# Patient Record
Sex: Male | Born: 1935 | Race: White | Hispanic: No | Marital: Married | Smoking: Former smoker
Health system: Southern US, Community
[De-identification: ages and names within clinical notes are randomized; demographics above are authoritative.]

## PROBLEM LIST (undated history)

## (undated) DIAGNOSIS — G20 Parkinson's disease: Secondary | ICD-10-CM

## (undated) DIAGNOSIS — I639 Cerebral infarction, unspecified: Secondary | ICD-10-CM

## (undated) DIAGNOSIS — I2510 Atherosclerotic heart disease of native coronary artery without angina pectoris: Secondary | ICD-10-CM

## (undated) DIAGNOSIS — Z992 Dependence on renal dialysis: Secondary | ICD-10-CM

## (undated) DIAGNOSIS — I219 Acute myocardial infarction, unspecified: Secondary | ICD-10-CM

## (undated) DIAGNOSIS — N289 Disorder of kidney and ureter, unspecified: Secondary | ICD-10-CM

## (undated) HISTORY — PX: PACEMAKER INSERTION: SHX728

---

## 2004-03-21 ENCOUNTER — Other Ambulatory Visit: Payer: Self-pay

## 2004-07-24 ENCOUNTER — Ambulatory Visit: Payer: Self-pay

## 2004-10-10 ENCOUNTER — Inpatient Hospital Stay: Payer: Self-pay | Admitting: Internal Medicine

## 2005-04-29 ENCOUNTER — Ambulatory Visit: Payer: Self-pay | Admitting: Internal Medicine

## 2005-11-21 ENCOUNTER — Inpatient Hospital Stay: Payer: Self-pay | Admitting: Cardiology

## 2005-11-22 ENCOUNTER — Other Ambulatory Visit: Payer: Self-pay

## 2006-06-19 ENCOUNTER — Inpatient Hospital Stay: Payer: Self-pay | Admitting: Cardiology

## 2006-06-19 ENCOUNTER — Other Ambulatory Visit: Payer: Self-pay

## 2006-10-06 ENCOUNTER — Ambulatory Visit: Payer: Self-pay | Admitting: Gastroenterology

## 2006-11-14 ENCOUNTER — Other Ambulatory Visit: Payer: Self-pay

## 2006-11-14 ENCOUNTER — Emergency Department: Payer: Self-pay

## 2007-08-13 ENCOUNTER — Ambulatory Visit: Payer: Self-pay | Admitting: Cardiology

## 2007-08-27 ENCOUNTER — Ambulatory Visit: Payer: Self-pay | Admitting: Cardiology

## 2008-02-07 ENCOUNTER — Inpatient Hospital Stay: Payer: Self-pay | Admitting: Internal Medicine

## 2008-02-07 ENCOUNTER — Other Ambulatory Visit: Payer: Self-pay

## 2008-09-06 ENCOUNTER — Ambulatory Visit: Payer: Self-pay | Admitting: Internal Medicine

## 2009-02-22 ENCOUNTER — Ambulatory Visit: Payer: Self-pay | Admitting: Ophthalmology

## 2009-10-25 ENCOUNTER — Encounter: Payer: Self-pay | Admitting: Unknown Physician Specialty

## 2009-11-27 ENCOUNTER — Encounter: Payer: Self-pay | Admitting: Unknown Physician Specialty

## 2009-12-19 ENCOUNTER — Emergency Department: Payer: Self-pay

## 2009-12-20 ENCOUNTER — Inpatient Hospital Stay: Payer: Self-pay | Admitting: Unknown Physician Specialty

## 2009-12-29 ENCOUNTER — Observation Stay: Payer: Self-pay | Admitting: Urology

## 2010-01-02 LAB — PATHOLOGY REPORT

## 2011-04-17 ENCOUNTER — Ambulatory Visit: Payer: Self-pay | Admitting: Urology

## 2011-08-27 ENCOUNTER — Emergency Department: Payer: Self-pay | Admitting: Emergency Medicine

## 2012-03-23 ENCOUNTER — Inpatient Hospital Stay: Payer: Self-pay | Admitting: Internal Medicine

## 2012-03-23 LAB — BASIC METABOLIC PANEL
BUN: 33 mg/dL — ABNORMAL HIGH (ref 7–18)
Chloride: 101 mmol/L (ref 98–107)
EGFR (African American): 10 — ABNORMAL LOW
EGFR (Non-African Amer.): 8 — ABNORMAL LOW
Glucose: 113 mg/dL — ABNORMAL HIGH (ref 65–99)
Osmolality: 287 (ref 275–301)
Potassium: 4 mmol/L (ref 3.5–5.1)
Sodium: 140 mmol/L (ref 136–145)

## 2012-03-23 LAB — CBC
HGB: 11.4 g/dL — ABNORMAL LOW (ref 13.0–18.0)
MCH: 32.5 pg (ref 26.0–34.0)
MCHC: 34.4 g/dL (ref 32.0–36.0)
Platelet: 244 10*3/uL (ref 150–440)
RBC: 3.5 10*6/uL — ABNORMAL LOW (ref 4.40–5.90)
RDW: 14.2 % (ref 11.5–14.5)
WBC: 9 10*3/uL (ref 3.8–10.6)

## 2012-03-24 LAB — LIPID PANEL
HDL Cholesterol: 36 mg/dL — ABNORMAL LOW (ref 40–60)
Triglycerides: 172 mg/dL (ref 0–200)

## 2012-03-24 LAB — BASIC METABOLIC PANEL
Calcium, Total: 9.1 mg/dL (ref 8.5–10.1)
Chloride: 100 mmol/L (ref 98–107)
Creatinine: 6.31 mg/dL — ABNORMAL HIGH (ref 0.60–1.30)
EGFR (African American): 9 — ABNORMAL LOW
Glucose: 88 mg/dL (ref 65–99)
Osmolality: 288 (ref 275–301)
Potassium: 4.2 mmol/L (ref 3.5–5.1)
Sodium: 140 mmol/L (ref 136–145)

## 2012-03-24 LAB — CBC WITH DIFFERENTIAL/PLATELET
Basophil #: 0 10*3/uL (ref 0.0–0.1)
Basophil %: 0.6 %
Eosinophil %: 4.3 %
HCT: 33 % — ABNORMAL LOW (ref 40.0–52.0)
HGB: 11.2 g/dL — ABNORMAL LOW (ref 13.0–18.0)
Lymphocyte %: 17.1 %
MCHC: 34.1 g/dL (ref 32.0–36.0)
Monocyte %: 9.3 %
Neutrophil #: 5.1 10*3/uL (ref 1.4–6.5)
Neutrophil %: 68.7 %
Platelet: 233 10*3/uL (ref 150–440)
RBC: 3.47 10*6/uL — ABNORMAL LOW (ref 4.40–5.90)
RDW: 13.9 % (ref 11.5–14.5)
WBC: 7.4 10*3/uL (ref 3.8–10.6)

## 2012-03-24 LAB — HEMOGLOBIN A1C: Hemoglobin A1C: 4.9 % (ref 4.2–6.3)

## 2012-03-25 LAB — CBC WITH DIFFERENTIAL/PLATELET
Basophil #: 0 10*3/uL (ref 0.0–0.1)
Basophil %: 0.5 %
Eosinophil #: 0.4 10*3/uL (ref 0.0–0.7)
HCT: 32.1 % — ABNORMAL LOW (ref 40.0–52.0)
HGB: 10.9 g/dL — ABNORMAL LOW (ref 13.0–18.0)
Lymphocyte %: 13.1 %
MCHC: 33.9 g/dL (ref 32.0–36.0)
Monocyte %: 7.5 %
Neutrophil #: 7.5 10*3/uL — ABNORMAL HIGH (ref 1.4–6.5)
Neutrophil %: 75.1 %
RBC: 3.37 10*6/uL — ABNORMAL LOW (ref 4.40–5.90)
RDW: 14.3 % (ref 11.5–14.5)
WBC: 10 10*3/uL (ref 3.8–10.6)

## 2012-03-25 LAB — COMPREHENSIVE METABOLIC PANEL
Albumin: 3.5 g/dL (ref 3.4–5.0)
Anion Gap: 14 (ref 7–16)
Bilirubin,Total: 0.4 mg/dL (ref 0.2–1.0)
Calcium, Total: 9.1 mg/dL (ref 8.5–10.1)
Chloride: 101 mmol/L (ref 98–107)
Creatinine: 7.6 mg/dL — ABNORMAL HIGH (ref 0.60–1.30)
EGFR (African American): 7 — ABNORMAL LOW
EGFR (Non-African Amer.): 6 — ABNORMAL LOW
Glucose: 95 mg/dL (ref 65–99)
Osmolality: 294 (ref 275–301)
Potassium: 4.5 mmol/L (ref 3.5–5.1)
Sodium: 141 mmol/L (ref 136–145)
Total Protein: 7.4 g/dL (ref 6.4–8.2)

## 2012-03-26 LAB — BASIC METABOLIC PANEL
BUN: 29 mg/dL — ABNORMAL HIGH (ref 7–18)
Calcium, Total: 9.3 mg/dL (ref 8.5–10.1)
Chloride: 95 mmol/L — ABNORMAL LOW (ref 98–107)
Co2: 33 mmol/L — ABNORMAL HIGH (ref 21–32)
Creatinine: 5.66 mg/dL — ABNORMAL HIGH (ref 0.60–1.30)
EGFR (African American): 10 — ABNORMAL LOW
Sodium: 137 mmol/L (ref 136–145)

## 2012-03-26 LAB — CBC WITH DIFFERENTIAL/PLATELET
Basophil %: 0.4 %
Eosinophil %: 4.4 %
HCT: 32.3 % — ABNORMAL LOW (ref 40.0–52.0)
HGB: 11 g/dL — ABNORMAL LOW (ref 13.0–18.0)
Lymphocyte %: 14.4 %
MCV: 95 fL (ref 80–100)
Monocyte #: 0.8 x10 3/mm (ref 0.2–1.0)
Monocyte %: 8.6 %
Neutrophil #: 6.6 10*3/uL — ABNORMAL HIGH (ref 1.4–6.5)
Neutrophil %: 72.2 %
RBC: 3.41 10*6/uL — ABNORMAL LOW (ref 4.40–5.90)
WBC: 9.1 10*3/uL (ref 3.8–10.6)

## 2012-05-28 ENCOUNTER — Ambulatory Visit: Payer: Self-pay | Admitting: Vascular Surgery

## 2012-07-09 ENCOUNTER — Ambulatory Visit: Payer: Self-pay | Admitting: Vascular Surgery

## 2012-09-15 ENCOUNTER — Encounter: Payer: Self-pay | Admitting: Neurology

## 2012-09-22 ENCOUNTER — Encounter: Payer: Self-pay | Admitting: Neurology

## 2012-10-22 ENCOUNTER — Encounter: Payer: Self-pay | Admitting: Neurology

## 2012-11-19 ENCOUNTER — Ambulatory Visit: Payer: Self-pay | Admitting: Cardiology

## 2012-11-22 ENCOUNTER — Encounter: Payer: Self-pay | Admitting: Neurology

## 2014-01-11 ENCOUNTER — Ambulatory Visit: Payer: Self-pay | Admitting: Cardiology

## 2014-01-11 DIAGNOSIS — I10 Essential (primary) hypertension: Secondary | ICD-10-CM

## 2014-01-11 DIAGNOSIS — I2510 Atherosclerotic heart disease of native coronary artery without angina pectoris: Secondary | ICD-10-CM

## 2014-01-11 LAB — CBC WITH DIFFERENTIAL/PLATELET
BASOS ABS: 0 10*3/uL (ref 0.0–0.1)
Basophil %: 0.1 %
EOS PCT: 0.1 %
Eosinophil #: 0 10*3/uL (ref 0.0–0.7)
HCT: 38.4 % — AB (ref 40.0–52.0)
HGB: 12.2 g/dL — AB (ref 13.0–18.0)
Lymphocyte #: 0.8 10*3/uL — ABNORMAL LOW (ref 1.0–3.6)
Lymphocyte %: 5.7 %
MCH: 32.5 pg (ref 26.0–34.0)
MCHC: 31.8 g/dL — ABNORMAL LOW (ref 32.0–36.0)
MCV: 102 fL — ABNORMAL HIGH (ref 80–100)
Monocyte #: 0.4 x10 3/mm (ref 0.2–1.0)
Monocyte %: 2.9 %
NEUTROS ABS: 13.3 10*3/uL — AB (ref 1.4–6.5)
Neutrophil %: 91.2 %
PLATELETS: 203 10*3/uL (ref 150–440)
RBC: 3.76 10*6/uL — ABNORMAL LOW (ref 4.40–5.90)
RDW: 15.3 % — ABNORMAL HIGH (ref 11.5–14.5)
WBC: 14.6 10*3/uL — ABNORMAL HIGH (ref 3.8–10.6)

## 2014-01-11 LAB — BASIC METABOLIC PANEL
ANION GAP: 11 (ref 7–16)
BUN: 73 mg/dL — ABNORMAL HIGH (ref 7–18)
CALCIUM: 8.2 mg/dL — AB (ref 8.5–10.1)
Chloride: 101 mmol/L (ref 98–107)
Co2: 28 mmol/L (ref 21–32)
Creatinine: 7.96 mg/dL — ABNORMAL HIGH (ref 0.60–1.30)
EGFR (African American): 7 — ABNORMAL LOW
EGFR (Non-African Amer.): 6 — ABNORMAL LOW
GLUCOSE: 131 mg/dL — AB (ref 65–99)
Osmolality: 303 (ref 275–301)
Potassium: 5.4 mmol/L — ABNORMAL HIGH (ref 3.5–5.1)
Sodium: 140 mmol/L (ref 136–145)

## 2014-01-11 LAB — PROTIME-INR
INR: 1.1
Prothrombin Time: 14.5 secs (ref 11.5–14.7)

## 2014-01-11 LAB — APTT: Activated PTT: 34.7 secs (ref 23.6–35.9)

## 2014-01-13 ENCOUNTER — Ambulatory Visit: Payer: Self-pay | Admitting: Cardiology

## 2014-10-11 NOTE — Consult Note (Signed)
PATIENT NAME:  Gus PumaSS, Seville E MR#:  [id number] DATE OF BIRTH:  05-03-1929  DATE OF CONSULTATION:  03/24/2012  REFERRING PHYSICIAN:  Aram BeechamJeffrey Sparks, MD CONSULTING PHYSICIAN:  Hemang K. Sherryll BurgerShah, MD  PRIMARY CARE PHYSICIAN: Daniel NonesBert Klein, MD  REASON FOR CONSULTATION: Slurred speech and right-sided weakness.   HISTORY OF PRESENT ILLNESS: Mr. Darrin Nipperass is an 81 year old right-handed Caucasian gentleman who had acute onset of weakness of the right arm and leg and some slurred speech on 03/23/2012 around 7:00 p.m.   The patient did not have any pain. He did not have any headache trauma or neck trauma. He was brought to the ER. His symptoms had significantly improved by that time but the patient retrospectively feels like his symptoms lasted for around 3 to 4 hours.   Today he feels like his right leg is still a little bit "tight" and is not quite itself, but otherwise he is not having any slurred speech or any right upper extremity weakness.   The patient denied any history of recent palpitations. He does not have any recent history of stroke. Before this stroke he was taking aspirin 81 mg p.o. daily.   The patient does have risk factors such as hypertension and hyperlipidemia and is on hemodialysis due to end-stage renal disease.   The patient has a history of developing shingles in his left V1 distribution causing pain and ptosis in his left eyelid.   The patient also has a history of pacemaker placement after recurrent passing out spells and found to have bradycardia.   PAST MEDICAL HISTORY:  1. Hypertension. 2. Hyperlipidemia. 3. Coronary artery disease. 4. Gout.  5. End stage renal disease on hemodialysis. 6. Anemia. 7. Gastroesophageal reflux disease.  8. Complete heart block status post pacemaker placement.   PAST SURGICAL HISTORY:  1. Pacemaker placement.  2. Cholecystectomy.  3. Bladder tumor status post surgery.   SOCIAL HISTORY: He does not smoke, does not drink alcohol, and does not  do recreational drugs.   FAMILY HISTORY: History is significant for coronary artery disease.   DRUG ALLERGIES: Penicillin.   MEDICATIONS: I reviewed his home medication list.   REVIEW OF SYSTEMS: Positive for left eyelid ptosis, pain in his left forehead, and numbness in his right leg which is chronic and recent slurred speech and right arm and leg weakness which is improved, except some tightness feeling in his right arm.   The rest of the 10 system review of systems was asked and was found to be negative.   PHYSICAL EXAMINATION:   VITAL SIGNS: Temperature 98.3, pulse 59, respiratory rate 20, blood pressure 138/65, and pulse oximetry 98% on 2 liters of oxygen.   GENERAL: He is an elderly-looking Caucasian gentleman lying in bed, not in acute distress. He was actually sleeping when I arrived in the room.   LUNGS: Clear to auscultation.   HEART: S1 and S2 heart sounds.   NECK: Carotid exam did not reveal any bruit.   EYES: Funduscopic exam was difficult to perform. He has bilateral artificial lens in his eyes.   SKIN: The patient does have a fistula in his left upper arm. He has a pacemaker in his left retroclavicular foci.   NEUROLOGIC: He was alert and oriented, followed two-step command, except he got confused with tiger was killed by lion, tell me who is dead, and he said the lion was dead.   He was able to identify two objects. He said today is Tuesday, October 2013. Initially he said [id number],  but then self-corrected himself. He could not tell me the date.   He seemed to have good prosody of his speech. He has good fluency. He was able to read.   His face looked symmetric. I did not see any neurological neglect.   On his cranial nerves, his pupils were reactive, his extraocular movements were intact, and his visual fields were full. His face was symmetric. Tongue was midline. Facial sensations were intact. He does have ptosis in his left eye. Hearing seemed to be decreased.    On his motor examination, his strength is 5/5 in both upper and lower extremities, but he felt like the right lower extremity was just not quite right.   On his sensory examination, he has decreased sensation to light touch in his left and right lower extremities distally, right worse than the left.   His deep tendon reflexes are symmetric and trace. His toes are mute.   I did not check his gait.   ASSESSMENT AND PLAN:  1. Acute onset of right-sided arm and leg weakness with slurred speech lasting for 3 to 4 hours with spontaneous resolution with some residual right lower extremity "tightness" feeling. It is likely suggestive of "minor stroke". Initially it was called transient ischemic attack, but as the patient still has some residual deficit I think we should call it "a minor stroke".  The patient cannot receive MRI due to he has a pacemaker so we should obtain 24 hour CT scan of the head.   I reviewed his current CT scan and compared it to the one in 2006. He does have a new lacunar infarct in his left anterior putamen which I believe is not the cause of his current symptoms.   This infarct seems to be older and he has acute onset of symptoms, which might not show up on the first CT scan.   The patient should also receive traditional stroke etiology work-up such as carotid Doppler which was unremarkable, echocardiogram, telemetry monitoring, and lipid panel.  I agree with switching him from aspirin 81 mg to aspirin 325 mg.   He is already on a statin. He does have good blood pressure control. He does not have known diabetes. I talked to the patient about the importance of risk factor reduction.   He should receive aggressive physical therapy and occupational therapy.   Influenza vaccine has shown to reduce recurrent stroke risk.   The patient was educated on importance of calling 911 if he has any new focal strokelike symptoms once he gets discharged.   The patient should receive  deep vein thrombosis prophylaxis.   2. Left ptosis after the patient had varicella-zoster of his V1 distribution.   Soon in the active period of zoster infection it can cause small vessel vasculitis which can lead to lacunar stroke. But his zoster infection was in June of 2013, so I do not think these two events are related to each other. But if he has another lacunar infarct or another stroke in recent future, he should be worked up for zoster-related vasculitis.   3. Peripheral neuropathy which he has had for a long time and is involving the right side lower extremity more than the left. So NIH stroke scale wise he will receive two points on my exam today.   But his numbness should be considered chronic and not acute.   The patient can have outpatient work-up for this.   4. The patient also has end stage renal disease and  is on hemodialysis and management per hospitalist physician.   Feel free to contact me with any further questions. I will follow this patient with you in the hospital.  ____________________________ Hemang K. Sherryll Burger, MD hks:slb D: 03/24/2012 17:15:17 ET T: 03/24/2012 17:32:56 ET JOB#: [id number]  cc: Hemang K. Sherryll Burger, MD, <Dictator> Durene Cal [facility] MD ELECTRONICALLY SIGNED 03/26/2012 17:21

## 2014-10-11 NOTE — Consult Note (Signed)
Brief Consult Note: Diagnosis: Stroke - right sided weakness.   Patient was seen by consultant.   Consult note dictated.   Comments: 1) New onset right sided weakness - that resolved but still felt right LE is not quite as it was before. CT head old left putamenal lacunar stroke. Should repeat CT head in 24 hr. No MRI due to pacemaker. Did not receive tPA due to rapid improvement in his hemiparesis. Stroke work up telemetry, CD (no hemodynemically sign stenosis), ECHO, lipid panel. Agree with switch from ASA 81 to 325 mg. On statin, BP avoid hypotension in peristroke period. Agressive PT, OT, DVT prophylaxis, flu vaccine has known to reduce recurrent stroke risk. 2) left ptosis s/p shingles since June 2013, Varicella zoster can cause small vessel vasculities but his presentation is too delayed for it. 3) Known peripheral neuropathy Rt. LE > left LE per pt causing numbness - old. Other issues per hospitalist - pt with ESRD on HD.  Electronic Signatures: Jolene ProvostShah, Marche Hottenstein Kalpeshkumar (MD)  (Signed 01-Oct-13 17:05)  Authored: Brief Consult Note   Last Updated: 01-Oct-13 17:05 by Jolene ProvostShah, Lindsay Soulliere Kalpeshkumar (MD)

## 2014-10-11 NOTE — Discharge Summary (Signed)
PATIENT NAME:  Gus PumaSS, Margarita E MR#:  [id number] DATE OF BIRTH:  1929/03/01  DATE OF ADMISSION:  03/23/2012 DATE OF DISCHARGE:  03/26/2012  REASON FOR ADMISSION: Slurred speech with right-sided weakness.   HISTORY OF PRESENT ILLNESS: Please see the dictated history of present illness done by Dr. Imogene Burnhen on 03/23/2012.  PAST MEDICAL HISTORY:  1. End-stage renal disease, on hemodialysis.  2. Benign hypertension.  3. Hyperlipidemia. 4. Gout.  5. Atherosclerotic cardiovascular disease.  6. Chronic anemia.  7. Gastroesophageal reflux disease.  8. History of complete heart block status post pacemaker implant.   MEDICATIONS ON ADMISSION: Please see admission note.   ALLERGIES: Penicillin.   SOCIAL HISTORY, FAMILY HISTORY AND REVIEW OF SYSTEMS: As per admission note.   PHYSICAL EXAM: The patient was in no acute distress. Vital signs were stable and he was afebrile. HEENT exam was unremarkable. Neck was supple without JVD. Lungs were clear. Cardiac exam revealed a regular rate and rhythm with normal S1 and S2. Abdomen was soft and nontender. Extremities were without edema. Neurologic exam revealed some right-sided weakness.   HOSPITAL COURSE: The patient was admitted with acute stroke syndrome, end-stage renal disease, and chronic anemia. He was placed on full strength aspirin. Initial head CT was unremarkable. He was seen in consultation by neurology who recommended a repeat CT within 24 hours. The repeat head CT was also unremarkable. Clinically, however, it was felt that the patient did have stroke. He was treated accordingly with increased aspirin dosage. His symptoms improved including his right-sided weakness. His slurred speech resolved. He was seen by therapy in the hospital. Home health was recommended. The patient was stable and ready for discharge on 03/26/2012.   DISCHARGE DIAGNOSES:  1. Acute stroke syndrome.  2. End-stage renal disease, on hemodialysis.  3. Chronic anemia.   4. Gastroesophageal reflux disease.  5. Benign hypertension.  6. Atherosclerotic cardiovascular disease.  7. History of complete heart block status post pacemaker implant.   DISCHARGE MEDICATIONS:  1. Lopid 600 mg p.o. twice a day. 2. Atenolol 50 mg p.o. twice a day.  3. Nephro-Vite 1 p.o. daily.  4. Lasix 80 mg p.o. daily on Tuesday, Thursday, Saturday, and Sunday.  5. Omeprazole 20 mg p.o. twice a day. 6. PhosLo one p.o. three times daily. 7. Aspirin 325 mg p.o. daily.  8. Lovastatin 20 mg p.o. daily.  9. Flomax 0.4 mg p.o. daily.  10. Cipro 500 mg p.o. daily for 10 days.   FOLLOW-UP PLANS AND APPOINTMENTS: The patient will be followed by home health. He was discharged on a renal diet. He will continue hemodialysis per his usual routine. He will follow with me in 1 to 2 weeks, sooner if needed.   TOTAL TIME SPENT: 50 minutes.  ____________________________ Duane LopeJeffrey D. Judithann SheenSparks, MD jds:slb D: 04/03/2012 09:50:00 ET     T: 04/03/2012 13:35:15 ET        JOB#: [id number] cc: Duane LopeJeffrey D. Judithann SheenSparks, MD, <Dictator> Tyler Robidoux Rodena Medin Chi Garlow MD ELECTRONICALLY SIGNED 04/03/2012 16:12

## 2014-10-11 NOTE — H&P (Signed)
PATIENT NAME:  [name], [name] MR#:  [id number] DATE OF BIRTH:  03-Aug-1928  DATE OF ADMISSION:  03/23/2012  PRIMARY CARE PHYSICIAN: Dr. Daniel NonesBert Klein REFERRING PHYSICIAN: Dr. Glenetta HewMcLaurin  CHIEF COMPLAINT: Slurred speech and right side weakness at 7:00 p.m. today.   HISTORY OF PRESENT ILLNESS: 81 year old Caucasian male with history of hypertension, hyperlipidemia, ischemic cardiac myopathy, coronary artery disease presented to the ED with above chief complaint. Patient is alert, awake, oriented but has slurred speech, could not communicate well. According to patient's son and wife, patient suddenly developed slurred speech and right side weakness at 7:00 p.m. today. Patient got dialysis early in the morning. He was fine until 7:00 p.m. when he suddenly developed slurred speech and right side weakness. Patient denies any headache or dizziness. No chest pain, palpitations, orthopnea, or nocturnal dyspnea. No numbness. No incontinence. Patient's CAT scan of head showed CVA and was treated with aspirin. Patient could not swallow aspirin properly, was noted to have dysphagia.   PAST MEDICAL HISTORY:  1. Hypertension.  2. Hyperlipidemia.  3. Coronary artery disease.  4. Gout. 5. End-stage renal disease on dialysis. 6. Anemia.  7. Gastroesophageal reflux disease. 8. Complete heart block status post pacemaker.   PAST SURGICAL HISTORY:  1. Pacemaker placement. 2. Cholecystectomy. 3. Bladder tumor status post surgery.   SOCIAL HISTORY: No smoking, alcohol drinking or illicit drugs.   FAMILY HISTORY: Significant for coronary artery disease.   ALLERGIES: Penicillin.  HOME MEDICATIONS: 1. Aspirin 81 mg p.o. daily. 2. Atenolol 50 mg p.o. b.i.d.  3. Gemfibrozil 600 mg p.o. b.i.d.  4. Lasix 80 mg p.o. Tuesday, Thursday, Saturday and Sunday. 5. Nephro-Vite oral tablet 1 tablet p.o. daily.  6. Omeprazole 20 mg p.o. b.i.d.  7. PhosLo gel cap 667 mg p.o. cap 1 cap t.i.d.    REVIEW OF SYSTEMS:  CONSTITUTIONAL: Patient denies any fever, chills. No headache or dizziness. EYES: No double vision, blurred vision. ENT: No postnasal drip, epistaxis but has slurred speech and dysphagia. RESPIRATORY: No cough, sputum, shortness of breath, or hemoptysis. CARDIOVASCULAR: No chest pain, palpitation, orthopnea, or nocturnal dyspnea. GASTROINTESTINAL: No abdominal pain, nausea, vomiting, diarrhea. No melena or bloody stools. GENITOURINARY: No dysuria, hematuria, or incontinence. SKIN: No rash or jaundice. ENDOCRINE: No polyuria, polydipsia. HEMATOLOGY: No easy bruising, bleeding. NEUROLOGY: No syncope, loss of consciousness or seizure but has slurred speech, dysphagia, and right side weakness.   PHYSICAL EXAMINATION:  VITAL SIGNS: Blood pressure 118/53, pulse 65, respirations 18, oxygen saturation 92% on room air.   GENERAL: Patient is alert, awake, oriented in no acute distress.   HEENT: Pupils round, equal, reactive to light, accommodation. Moist oral mucosa. Clear oropharynx.   NECK: Supple. No JVD or carotid bruit. No lymphadenopathy. No thyromegaly.   CARDIOVASCULAR: S1, S2 regular rate, rhythm. No murmur or gallop.    PULMONARY: Bilateral air entry. No wheezing or rales. No use of accessory muscles to breathe.   ABDOMEN: Soft. No distention or tenderness. No organomegaly. Bowel sounds present.   EXTREMITIES: No edema, clubbing, or cyanosis. No calf tenderness. Strong bilateral pedal pulses. Power 5/5 on the left side but about 2 to 3/5 on the right upper and lower extremities. Sensation decreased. In addition, patient has right-sided facial droop and slurred speech.   SKIN: No rash or jaundice.   LABORATORY, DIAGNOSTIC AND RADIOLOGICAL DATA: Glucose 113, BUN 33, creatinine 5.84. Electrolytes are normal. WBC 9.0, hemoglobin 11.4, platelets 244. Troponin 0.03. CAT scan of head without contrast shows small lacunar infarct on the  left basal ganglia.   EKG showed electronic ventricular pacemaker.    IMPRESSION:  1. Acute cerebrovascular accident.  2. Hypertension, controlled.  3. Coronary artery disease.   4. Hyperlipidemia.  5. End-stage renal disease.  6. Anemia.   PLAN OF TREATMENT:  1. The patient will be admitted to telemetry floor. Will increase aspirin to 325 mg p.o. daily and continue Lopid.   2. Will start neuro checks and follow up neurology. Patient is waiting for neurological consult via tele medicine.  3. Will get a physical therapy and a swallowing and speech study.  4. For hypertension will continue atenolol and Lasix.  5. For end-stage renal disease patient needs follow up nephrology for hemodialysis.   Discussed the patient's situation and plan of treatment with patient and patient's family member.   TIME SPENT: About 65 minutes.   ____________________________ Shaune Pollack, MD qc:cms D: 03/23/2012 22:57:12 ET T: 03/24/2012 06:40:10 ET  JOB#: [id number] cc: Lynnea Ferrier, MD Shaune Pollack MD ELECTRONICALLY SIGNED 03/25/2012 15:17

## 2014-10-11 NOTE — Op Note (Signed)
PATIENT NAME:  [name], [name] MR#:  [id number] DATE OF BIRTH:  06-24-1929  DATE OF PROCEDURE:  05/28/2012  PREOPERATIVE DIAGNOSES:  1. End-stage renal disease.  2. Poorly functioning left arm fistula with prolonged bleeding, arm swelling, and decreased Kt/V.  POSTOPERATIVE DIAGNOSES: 1. End-stage renal disease.  2. Poorly functioning left arm fistula with prolonged bleeding, arm swelling, and decreased Kt/V.  PROCEDURES:       1. Ultrasound guidance for vascular access to left brachiocephalic AV fistula.  2. Left upper extremity fistulogram and central venogram.  3. Percutaneous transluminal angioplasty of the left subclavian and innominate veins with 10 and 12 mm diameter angioplasty balloon.   SURGEON: Annice NeedyJason S. Saraiya Kozma, M.D.   ANESTHESIA: Local with moderate conscious sedation.   ESTIMATED BLOOD LOSS: Minimal.  FLUOROSCOPY TIME: Approximately three minutes.  CONTRAST USED: 30 milliliters.   INDICATION FOR PROCEDURE:  This is an 81 year old male with end-stage renal disease. His fistula function has declined and has become markedly aneurysmal and has prolonged bleeding. We were asked to evaluate this. Risks and benefits were discussed. Informed consent was obtained.   DESCRIPTION OF PROCEDURE: The patient was brought to the vascular interventional radiology suite. The left upper extremity was sterilely prepped and draped and a sterile surgical field was created. The fistula was accessed several centimeters beyond the anastomosis with a micropuncture needle. Micropuncture wire and sheath were then placed. Imaging was then performed. The cephalic vein was patent. There was a pacemaker that originated in the left subclavian vein and around the pacer wires in the left subclavian and innominate veins. There was a high-grade stenosis in the 90% range. A 6 French sheath was placed and I heparinized the patient. I was able to cross the lesion with a Glidewire and a Kumpe catheter and confirm  intraluminal flow in the superior vena cava. I then replaced a Magic torque wire and treated this area initially with a 10 mm diameter angioplasty balloon and then a 12 mm diameter angioplasty balloon. Following angioplasty, there was markedly improved flow with only mild residual stenosis in the 10% to 20% range. This was in a very poor location for stent placement so angioplasty only will be performed and the flow has been significantly improved so I elected to terminate the procedure. The sheath was removed around a 4-0 Monocryl pursestring suture. Pressure was held. Sterile dressing was placed. The patient tolerated the procedure well and was taken to the recovery room in stable condition.  ____________________________ Annice NeedyJason S. Anarosa Kubisiak, MD jsd:ap D: 05/28/2012 16:13:45 ET T: 05/28/2012 17:49:17 ET JOB#: [id number]  cc: Annice NeedyJason S. Zayden Hahne, MD, <Dictator> Annice NeedyJASON S Nylan Nakatani MD ELECTRONICALLY SIGNED 06/01/2012 8:33

## 2014-10-14 NOTE — Op Note (Signed)
PATIENT NAME:  Gus PumaSS, Rafal E MR#:  [id number] DATE OF BIRTH:  Oct 01, 1928  DATE OF PROCEDURE:  07/09/2012  PREOPERATIVE DIAGNOSES: 1. End-stage renal disease.  2. 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  3. Hypertension.  4. Heart arrhythmias, status post pacemaker placement.   POSTOPERATIVE DIAGNOSES:  1. End-stage renal disease.  2. 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  3. Hypertension.  4. Heart arrhythmias, status post pacemaker placement.   PROCEDURES: 1. Ultrasound guidance for vascular access to left brachiocephalic arteriovenous fistula.  2. Left upper extremity fistulogram.  3. Percutaneous transluminal angioplasty of left subclavian vein with 10 and 12 mm diameter angioplasty balloon around pacer wires.   SURGEON: Annice NeedyJason S Ronit Cranfield, MD   ANESTHESIA: Local with moderate conscious sedation.   ESTIMATED BLOOD LOSS: Minimal.   INDICATION FOR PROCEDURE: This is an 81 year old white male with end-stage renal disease. His left arm AV fistula is aneurysmal, and the function has been down recently. We are asked to evaluate this. The risks, benefits were discussed. Informed consent was obtained.   DESCRIPTION OF PROCEDURE: The patient is brought to the vascular and interventional radiology suite. The left upper extremity was sterilely prepped and draped, and a sterile surgical field was created. The fistula was accessed in an aneurysmal portion in the distal left upper arm. This was done without difficulty under direct ultrasound guidance, and permanent image was recorded.  A 6-French sheath was then placed and imaging was performed. This showed some tortuosity in the cephalic vein near the subclavian vein confluence, but there was not significant stenosis in this area. The subclavian vein itself did have what appeared to be a moderate 60 to 70% stenosis around pacer wires just under the clavicle. The superior vena cava was patent. With  compression of the fistula, imaging was performed of the arterial anastomosis which appeared to have some tortuosity but no significant stenosis greater than 50%. I crossed the lesion with a mild amount of difficulty with a Kumpe catheter and a Magic torque wire and then treated the lesion with a 10 and then a 12 mm diameter angioplasty balloon. A waist was taken which resolved with angioplasty. Completion angiogram showed this area to now have improved flow with less than 30% residual stenosis, and I elected to terminate the procedure. The sheath was removed around a 4-0 Monocryl pursestring suture. Pressure was held. Sterile dressing was placed. The patient tolerated the procedure well and was taken to the recovery room in stable condition.   ____________________________ Annice NeedyJason S. Laurice Kimmons, MD jsd:cb D: 07/09/2012 11:42:47 ET T: 07/09/2012 11:54:03 ET JOB#: [id number]  cc: Annice NeedyJason S. Cristian Davitt, MD, <Dictator> Annice NeedyJASON S Rashad Obeid MD ELECTRONICALLY SIGNED 07/10/2012 10:31

## 2014-10-15 NOTE — Op Note (Signed)
PATIENT NAME:  [name], [name] MR#:  [id number] DATE OF BIRTH:  22-May-1929  DATE OF PROCEDURE:  01/13/2014  PREPROCEDURE DIAGNOSES: 1. Complete heart block.  2. Elective replacement indication.   POSTPROCEDURE DIAGNOSIS: Atrial sensing with ventricular pacing.   PROCEDURE: Pacemaker generator change out.   SURGEON: Marcina MillardAlexander Quintez Maselli, M.D.   INDICATION: The patient is an 81 year old gentleman status post dual-chamber pacemaker for sick sinus syndrome. Recent pacemaker interrogation demonstrated elective replacement indication with underlying rhythm with a slow ventricular escape.   DESCRIPTION OF PROCEDURE: The risks, benefits and alternatives of pacemaker generator change out were explained to the patient and informed written consent was obtained.   He was brought to the operating room in a fasting state. The left pectoral region was prepped and draped in the usual sterile manner. Anesthesia was obtained with 1% Xylocaine locally. A 6 cm incision was performed over the left pectoral region over the old pacemaker site. The old pacemaker generator was retrieved by electrocautery and blunt dissection. The leads were disconnected, the old pacemaker generator interrogated. After proper thresholds were obtained, the leads were connected to a new dual-chamber rate responsive pacemaker generator (Adapta [id number]). The pacemaker pocket was irrigated with gentamicin solution. The new pacemaker generator was positioned in the pocket. The pocket was closed with 2-0 and 4-0 Vicryl, respectively. Steri-Strips and a pressure dressing were applied.    ____________________________ Marcina MillardAlexander Sharene Krikorian, MD ap:jr D: 01/13/2014 13:25:36 ET T: 01/13/2014 13:45:14 ET JOB#: [id number]  cc: Marcina MillardAlexander Iysis Germain, MD, <Dictator> Marcina MillardALEXANDER Juliauna Stueve MD ELECTRONICALLY SIGNED 02/01/2014 13:56

## 2015-05-30 ENCOUNTER — Encounter: Payer: Self-pay | Admitting: Emergency Medicine

## 2015-05-30 ENCOUNTER — Emergency Department: Payer: Medicare Other

## 2015-05-30 ENCOUNTER — Inpatient Hospital Stay
Admission: EM | Admit: 2015-05-30 | Discharge: 2015-06-02 | DRG: 492 | Disposition: A | Discharge status: Skilled Nursing Facility | Payer: Medicare Other | Attending: Internal Medicine | Admitting: Internal Medicine

## 2015-05-30 DIAGNOSIS — E785 Hyperlipidemia, unspecified: Secondary | ICD-10-CM | POA: Diagnosis present

## 2015-05-30 DIAGNOSIS — I252 Old myocardial infarction: Secondary | ICD-10-CM

## 2015-05-30 DIAGNOSIS — Z880 Allergy status to penicillin: Secondary | ICD-10-CM | POA: Diagnosis not present

## 2015-05-30 DIAGNOSIS — R748 Abnormal levels of other serum enzymes: Secondary | ICD-10-CM | POA: Diagnosis present

## 2015-05-30 DIAGNOSIS — N2581 Secondary hyperparathyroidism of renal origin: Secondary | ICD-10-CM | POA: Diagnosis present

## 2015-05-30 DIAGNOSIS — Z992 Dependence on renal dialysis: Secondary | ICD-10-CM | POA: Diagnosis not present

## 2015-05-30 DIAGNOSIS — I5022 Chronic systolic (congestive) heart failure: Secondary | ICD-10-CM | POA: Diagnosis present

## 2015-05-30 DIAGNOSIS — N186 End stage renal disease: Secondary | ICD-10-CM | POA: Diagnosis present

## 2015-05-30 DIAGNOSIS — Z955 Presence of coronary angioplasty implant and graft: Secondary | ICD-10-CM | POA: Diagnosis not present

## 2015-05-30 DIAGNOSIS — I959 Hypotension, unspecified: Secondary | ICD-10-CM | POA: Diagnosis not present

## 2015-05-30 DIAGNOSIS — G4733 Obstructive sleep apnea (adult) (pediatric): Secondary | ICD-10-CM | POA: Diagnosis present

## 2015-05-30 DIAGNOSIS — Z87891 Personal history of nicotine dependence: Secondary | ICD-10-CM | POA: Diagnosis not present

## 2015-05-30 DIAGNOSIS — I132 Hypertensive heart and chronic kidney disease with heart failure and with stage 5 chronic kidney disease, or end stage renal disease: Secondary | ICD-10-CM | POA: Diagnosis present

## 2015-05-30 DIAGNOSIS — D631 Anemia in chronic kidney disease: Secondary | ICD-10-CM | POA: Diagnosis present

## 2015-05-30 DIAGNOSIS — I2510 Atherosclerotic heart disease of native coronary artery without angina pectoris: Secondary | ICD-10-CM | POA: Diagnosis present

## 2015-05-30 DIAGNOSIS — Z419 Encounter for procedure for purposes other than remedying health state, unspecified: Secondary | ICD-10-CM

## 2015-05-30 DIAGNOSIS — Z79899 Other long term (current) drug therapy: Secondary | ICD-10-CM | POA: Diagnosis not present

## 2015-05-30 DIAGNOSIS — D649 Anemia, unspecified: Secondary | ICD-10-CM | POA: Diagnosis present

## 2015-05-30 DIAGNOSIS — Z7982 Long term (current) use of aspirin: Secondary | ICD-10-CM

## 2015-05-30 DIAGNOSIS — K219 Gastro-esophageal reflux disease without esophagitis: Secondary | ICD-10-CM | POA: Diagnosis present

## 2015-05-30 DIAGNOSIS — G20 Parkinson's disease: Secondary | ICD-10-CM | POA: Diagnosis present

## 2015-05-30 DIAGNOSIS — E875 Hyperkalemia: Secondary | ICD-10-CM | POA: Diagnosis present

## 2015-05-30 DIAGNOSIS — S82852A Displaced trimalleolar fracture of left lower leg, initial encounter for closed fracture: Secondary | ICD-10-CM | POA: Diagnosis present

## 2015-05-30 DIAGNOSIS — Z950 Presence of cardiac pacemaker: Secondary | ICD-10-CM | POA: Diagnosis not present

## 2015-05-30 DIAGNOSIS — W010XXA Fall on same level from slipping, tripping and stumbling without subsequent striking against object, initial encounter: Secondary | ICD-10-CM | POA: Diagnosis present

## 2015-05-30 DIAGNOSIS — Z8673 Personal history of transient ischemic attack (TIA), and cerebral infarction without residual deficits: Secondary | ICD-10-CM | POA: Diagnosis not present

## 2015-05-30 DIAGNOSIS — E119 Type 2 diabetes mellitus without complications: Secondary | ICD-10-CM | POA: Diagnosis present

## 2015-05-30 DIAGNOSIS — S82853A Displaced trimalleolar fracture of unspecified lower leg, initial encounter for closed fracture: Secondary | ICD-10-CM | POA: Diagnosis present

## 2015-05-30 HISTORY — DX: Parkinson's disease: G20

## 2015-05-30 HISTORY — DX: Acute myocardial infarction, unspecified: I21.9

## 2015-05-30 HISTORY — DX: Dependence on renal dialysis: Z99.2

## 2015-05-30 HISTORY — DX: Atherosclerotic heart disease of native coronary artery without angina pectoris: I25.10

## 2015-05-30 HISTORY — DX: Cerebral infarction, unspecified: I63.9

## 2015-05-30 HISTORY — DX: Disorder of kidney and ureter, unspecified: N28.9

## 2015-05-30 LAB — COMPREHENSIVE METABOLIC PANEL
ALBUMIN: 3.9 g/dL (ref 3.5–5.0)
ALT: 5 U/L — ABNORMAL LOW (ref 17–63)
AST: 16 U/L (ref 15–41)
Alkaline Phosphatase: 93 U/L (ref 38–126)
Anion gap: 15 (ref 5–15)
BUN: 45 mg/dL — AB (ref 6–20)
CHLORIDE: 98 mmol/L — AB (ref 101–111)
CO2: 29 mmol/L (ref 22–32)
Calcium: 9.8 mg/dL (ref 8.9–10.3)
Creatinine, Ser: 8.52 mg/dL — ABNORMAL HIGH (ref 0.61–1.24)
GFR calc Af Amer: 6 mL/min — ABNORMAL LOW (ref >60)
GFR calc non Af Amer: 5 mL/min — ABNORMAL LOW (ref >60)
GLUCOSE: 93 mg/dL (ref 65–99)
POTASSIUM: 6.1 mmol/L — AB (ref 3.5–5.1)
Sodium: 142 mmol/L (ref 135–145)
Total Bilirubin: 0.8 mg/dL (ref 0.3–1.2)
Total Protein: 8 g/dL (ref 6.5–8.1)

## 2015-05-30 LAB — CBC WITH DIFFERENTIAL/PLATELET
BASOS ABS: 0 10*3/uL (ref 0–0.1)
BASOS PCT: 0 %
Eosinophils Absolute: 0.1 10*3/uL (ref 0–0.7)
Eosinophils Relative: 1 %
HCT: 36.2 % — ABNORMAL LOW (ref 40.0–52.0)
Hemoglobin: 11.6 g/dL — ABNORMAL LOW (ref 13.0–18.0)
LYMPHS ABS: 1.1 10*3/uL (ref 1.0–3.6)
LYMPHS PCT: 7 %
MCH: 31.5 pg (ref 26.0–34.0)
MCHC: 32 g/dL (ref 32.0–36.0)
MCV: 98.4 fL (ref 80.0–100.0)
MONOS PCT: 5 %
Monocytes Absolute: 0.8 10*3/uL (ref 0.2–1.0)
NEUTROS PCT: 87 %
Neutro Abs: 13.5 10*3/uL — ABNORMAL HIGH (ref 1.4–6.5)
PLATELETS: 265 10*3/uL (ref 150–440)
RBC: 3.67 MIL/uL — ABNORMAL LOW (ref 4.40–5.90)
RDW: 15.6 % — AB (ref 11.5–14.5)
WBC: 15.6 10*3/uL — AB (ref 3.8–10.6)

## 2015-05-30 LAB — TROPONIN I
TROPONIN I: 0.06 ng/mL — AB (ref <0.031)
TROPONIN I: 0.07 ng/mL — AB (ref <0.031)
Troponin I: 0.05 ng/mL — ABNORMAL HIGH (ref <0.031)

## 2015-05-30 LAB — PROTIME-INR
INR: 1.14
Prothrombin Time: 14.8 seconds (ref 11.4–15.0)

## 2015-05-30 MED ORDER — PRAVASTATIN SODIUM 20 MG PO TABS
10.0000 mg | ORAL_TABLET | Freq: Every day | ORAL | Status: DC
  Administered 2015-06-01: 10 mg via ORAL
  Filled 2015-05-30: qty 1
  Filled 2015-05-30: qty 0.5

## 2015-05-30 MED ORDER — CLINDAMYCIN PHOSPHATE 900 MG/50ML IV SOLN
900.0000 mg | Freq: Once | INTRAVENOUS | Status: AC
  Administered 2015-05-31: 900 mg via INTRAVENOUS
  Filled 2015-05-30 (×3): qty 50

## 2015-05-30 MED ORDER — CALCIUM ACETATE (PHOS BINDER) 667 MG PO CAPS
1334.0000 mg | ORAL_CAPSULE | Freq: Three times a day (TID) | ORAL | Status: DC
  Administered 2015-06-01 – 2015-06-02 (×5): 1334 mg via ORAL
  Filled 2015-05-30 (×7): qty 2

## 2015-05-30 MED ORDER — OXYCODONE HCL 5 MG PO TABS
5.0000 mg | ORAL_TABLET | ORAL | Status: DC | PRN
  Administered 2015-05-30: 10 mg via ORAL
  Administered 2015-05-31 – 2015-06-01 (×2): 5 mg via ORAL
  Administered 2015-06-02: 10 mg via ORAL
  Filled 2015-05-30 (×2): qty 2
  Filled 2015-05-30 (×2): qty 1

## 2015-05-30 MED ORDER — OXYCODONE-ACETAMINOPHEN 5-325 MG PO TABS
2.0000 | ORAL_TABLET | Freq: Once | ORAL | Status: DC
  Filled 2015-05-30: qty 2

## 2015-05-30 MED ORDER — PANTOPRAZOLE SODIUM 40 MG PO TBEC
40.0000 mg | DELAYED_RELEASE_TABLET | Freq: Every day | ORAL | Status: DC
Start: 2015-05-30 — End: 2015-06-02
  Administered 2015-05-31 – 2015-06-02 (×3): 40 mg via ORAL
  Filled 2015-05-30 (×3): qty 1

## 2015-05-30 MED ORDER — CARVEDILOL 3.125 MG PO TABS
3.1250 mg | ORAL_TABLET | Freq: Two times a day (BID) | ORAL | Status: DC
  Administered 2015-06-01 (×2): 3.125 mg via ORAL
  Filled 2015-05-30 (×4): qty 1

## 2015-05-30 MED ORDER — MORPHINE SULFATE (PF) 2 MG/ML IV SOLN
2.0000 mg | INTRAVENOUS | Status: DC | PRN
Start: 2015-05-30 — End: 2015-05-31
  Administered 2015-05-31: 2 mg via INTRAVENOUS
  Filled 2015-05-30: qty 1

## 2015-05-30 MED ORDER — ONDANSETRON HCL 4 MG PO TABS: 4.0000 mg | ORAL_TABLET | Freq: Once | ORAL | Status: DC

## 2015-05-30 MED ORDER — SODIUM CHLORIDE 0.9 % IJ SOLN
3.0000 mL | Freq: Two times a day (BID) | INTRAMUSCULAR | Status: DC
  Administered 2015-05-30 – 2015-06-01 (×4): 3 mL via INTRAVENOUS

## 2015-05-30 MED ORDER — INSULIN ASPART 100 UNIT/ML ~~LOC~~ SOLN: 0.0000 [IU] | Freq: Every day | SUBCUTANEOUS | Status: DC

## 2015-05-30 MED ORDER — SODIUM BICARBONATE 8.4 % IV SOLN: 50.0000 meq | Freq: Once | INTRAVENOUS | Status: DC

## 2015-05-30 MED ORDER — ONDANSETRON HCL 4 MG/2ML IJ SOLN
INTRAMUSCULAR | Status: AC
  Administered 2015-05-30: 4 mg
  Filled 2015-05-30: qty 2

## 2015-05-30 MED ORDER — DEXTROSE 50 % IV SOLN
25.0000 g | Freq: Once | INTRAVENOUS | Status: DC
  Filled 2015-05-30: qty 50

## 2015-05-30 MED ORDER — MIDODRINE HCL 5 MG PO TABS
10.0000 mg | ORAL_TABLET | ORAL | Status: DC | PRN
  Administered 2015-05-30 – 2015-06-02 (×2): 10 mg via ORAL
  Filled 2015-05-30 (×2): qty 2

## 2015-05-30 MED ORDER — GEMFIBROZIL 600 MG PO TABS
600.0000 mg | ORAL_TABLET | Freq: Two times a day (BID) | ORAL | Status: DC
  Administered 2015-05-30 – 2015-06-02 (×5): 600 mg via ORAL
  Filled 2015-05-30 (×7): qty 1

## 2015-05-30 MED ORDER — SODIUM POLYSTYRENE SULFONATE 15 GM/60ML PO SUSP
30.0000 g | Freq: Once | ORAL | Status: DC
  Filled 2015-05-30: qty 120

## 2015-05-30 MED ORDER — CINACALCET HCL 30 MG PO TABS
30.0000 mg | ORAL_TABLET | Freq: Every day | ORAL | Status: DC
  Administered 2015-06-01 – 2015-06-02 (×2): 30 mg via ORAL
  Filled 2015-05-30 (×4): qty 1

## 2015-05-30 MED ORDER — MORPHINE SULFATE (PF) 2 MG/ML IV SOLN
2.0000 mg | Freq: Once | INTRAVENOUS | Status: AC
  Administered 2015-05-30: 2 mg via INTRAVENOUS
  Filled 2015-05-30: qty 1

## 2015-05-30 MED ORDER — INSULIN ASPART 100 UNIT/ML ~~LOC~~ SOLN: 0.0000 [IU] | Freq: Three times a day (TID) | SUBCUTANEOUS | Status: DC

## 2015-05-30 MED ORDER — MORPHINE SULFATE (PF) 2 MG/ML IV SOLN
INTRAVENOUS | Status: AC
  Administered 2015-05-30: 2 mg via INTRAVENOUS
  Filled 2015-05-30: qty 1

## 2015-05-30 MED ORDER — CARBIDOPA-LEVODOPA 25-100 MG PO TABS
1.0000 | ORAL_TABLET | Freq: Two times a day (BID) | ORAL | Status: DC
  Administered 2015-05-30 – 2015-06-02 (×5): 1 via ORAL
  Filled 2015-05-30 (×5): qty 1

## 2015-05-30 MED ORDER — ASPIRIN 81 MG PO CHEW
81.0000 mg | CHEWABLE_TABLET | Freq: Every day | ORAL | Status: DC
  Administered 2015-06-01 – 2015-06-02 (×2): 81 mg via ORAL
  Filled 2015-05-30 (×3): qty 1

## 2015-05-30 MED ORDER — INSULIN ASPART 100 UNIT/ML ~~LOC~~ SOLN
10.0000 [IU] | Freq: Once | SUBCUTANEOUS | Status: DC
  Filled 2015-05-30: qty 10

## 2015-05-30 MED ORDER — ONDANSETRON HCL 4 MG/2ML IJ SOLN: 4.0000 mg | Freq: Once | INTRAMUSCULAR | Status: DC

## 2015-05-30 NOTE — Progress Notes (Signed)
[facility]  ROUNDING NOTE   Subjective:   Patient admitted this morning with left ankle fracture. ' Wife at bedside Patient got dialysis yesterday. But now with potassium of 6.1.   Objective:  Vital signs in last 24 hours:  Temp:  [97.7 F (36.5 C)-98.3 F (36.8 C)] 98.3 F (36.8 C) (12/06 1320) Pulse Rate:  [69-281] 78 (12/06 1320) Resp:  [16] 16 (12/06 1320) BP: (92-124)/(51-72) 113/51 mmHg (12/06 1320) SpO2:  [83 %-97 %] 97 % (12/06 1320) Weight:  [93.895 kg (207 lb)] 93.895 kg (207 lb) (12/06 0904)  Weight change:  Filed Weights   05/30/15 0904  Weight: 93.895 kg (207 lb)    Intake/Output:     Intake/Output this shift:     Physical Exam: General: NAD,   Head: Normocephalic, atraumatic. Moist oral mucosal membranes  Eyes: Anicteric, PERRL  Neck: Supple, trachea midline  Lungs:  Clear to auscultation  Heart: Regular rate and rhythm  Abdomen:  Soft, nontender,   Extremities:  no peripheral edema, left ankle in brace  Neurologic: Nonfocal, moving all four extremities  Skin: No lesions  Access: Left arm AV    Basic Metabolic Panel:  Recent Labs Lab 05/30/15 1012  NA 142  K 6.1*  CL 98*  CO2 29  GLUCOSE 93  BUN 45*  CREATININE 8.52*  CALCIUM 9.8    Liver Function Tests:  Recent Labs Lab 05/30/15 1012  AST 16  ALT 5*  ALKPHOS 93  BILITOT 0.8  PROT 8.0  ALBUMIN 3.9   No results for input(s): LIPASE, AMYLASE in the last 168 hours. No results for input(s): AMMONIA in the last 168 hours.  CBC:  Recent Labs Lab 05/30/15 1012  WBC 15.6*  NEUTROABS 13.5*  HGB 11.6*  HCT 36.2*  MCV 98.4  PLT 265    Cardiac Enzymes:  Recent Labs Lab 05/30/15 1012 05/30/15 1232  TROPONINI 0.06* 0.05*    BNP: Invalid input(s): POCBNP  CBG: No results for input(s): GLUCAP in the last 168 hours.  Microbiology: No results found for this or any previous visit.  Coagulation Studies:  Recent Labs  05/30/15 1012  LABPROT 14.8   INR 1.14    Urinalysis: No results for input(s): COLORURINE, LABSPEC, PHURINE, GLUCOSEU, HGBUR, BILIRUBINUR, KETONESUR, PROTEINUR, UROBILINOGEN, NITRITE, LEUKOCYTESUR in the last 72 hours.  Invalid input(s): APPERANCEUR    Imaging: Dg Ankle Complete Left  05/30/2015  CLINICAL DATA:  81 year old male status post fall in shower with left ankle deformity now splinted. Initial encounter. EXAM: LEFT ANKLE COMPLETE - 3+ VIEW COMPARISON:  None. FINDINGS: Comminuted spiral fracture of the distal left fibula meta diaphysis extending to the level of the lateral joint space. Transverse fracture through the medial malleolus at the level of the tibial plafond. Superimposed possible acute avulsion fracture at the tip of the medial malleolus. Comminuted fracture also suspected through the posterior malleolus on the lateral view although bone detail there is limited by splint material. Talar dome intact. Mortise joint alignment relatively preserved. No other acute fracture identified. IMPRESSION: Acute Trimalleolar fracture status post splint placement with relatively preserved mortise joint alignment. Electronically Signed   By: Odessa FlemingH  Hall M.D.   On: 05/30/2015 09:44     Medications:     . aspirin  81 mg Oral Daily  . carbidopa-levodopa  1 tablet Oral BID  . carvedilol  3.125 mg Oral BID WC  . cinacalcet  30 mg Oral Daily  . dextrose  25 g Intravenous Once  . gemfibrozil  600 mg Oral BID  . insulin aspart  0-5 Units Subcutaneous QHS  . insulin aspart  0-9 Units Subcutaneous TID WC  . insulin aspart  10 Units Intravenous Once  .  morphine injection  2 mg Intravenous Once  . ondansetron (ZOFRAN) IV  4 mg Intravenous Once  . ondansetron  4 mg Oral Once  . oxyCODONE-acetaminophen  2 tablet Oral Once  . pantoprazole  40 mg Oral Daily  . pravastatin  10 mg Oral q1800  . sodium bicarbonate  50 mEq Intravenous Once  . sodium chloride  3 mL Intravenous Q12H  . sodium polystyrene  30 g Oral Once    midodrine  Assessment/ Plan:  [name] is a 81 y.o. white male with Parkinson's, coronary artery disease, hyperlipidemia, hypertension, GERD  [facility] MWF [facility]  1. End Stage Renal Disease: MWF. Dialysis yesterday. Plan for later today due to hyeprkalemia. - unclear why elevated today when he got dialysis yesterday.   2. Hypertension: well controlled.  - carvedilol  3. Secondary Hyperparathyroidism: PTH 489, phos 5.1 and Ca 9.3 as outpatient. Not currently on binders - restart calcium acetate 2 tabs with meals.   4. Anemia of chronic kidney disease: hemoglobin 11.6. No indication for epo currently.    LOS: 0 [name] 12/6/20161:41 PM

## 2015-05-30 NOTE — Consult Note (Signed)
ORTHOPAEDIC CONSULTATION  REQUESTING PHYSICIAN: Adrian SaranSital Mody, MD  Chief Complaint:   Left ankle injury.  History of Present Illness: [name] is a 81 y.o. male with multiple medical problems, including end-stage renal disease requiring home hemodialysis, coronary artery disease, status post stroke, and Parkinson's disease, who lives at home with his wife. Apparently, he slipped and fell while trying to get out of the shower this morning in his home. He was brought to the emergency room where the ankle was immediately reduced and stabilized with a posterior splint by the emergency room physician. The patient denies any associated injuries and did not strike his head or lose consciousness. He denies any lightheadedness, dizziness, chest pain, shortness of breath, or other symptoms that may have predisposed him to his fall.  Past Medical History  Diagnosis Date  . Renal disorder   . Stroke (HCC)   . Coronary artery disease   . Myocardial infarction (HCC)   . Parkinson disease (HCC)   . Chronic home hemodialysis status [facility](HCC)    Past Surgical History  Procedure Laterality Date  . Pacemaker insertion     Social History   Social History  . Marital Status: Married    Spouse Name: N/A  . Number of Children: N/A  . Years of Education: N/A   Social History Main Topics  . Smoking status: Former Smoker    Quit date: 05/24/1969  . Smokeless tobacco: None  . Alcohol Use: No  . Drug Use: No  . Sexual Activity: Not Asked   Other Topics Concern  . None   Social History Narrative  . None   No family history on file. Allergies  Allergen Reactions  . Penicillins Swelling   Prior to Admission medications   Medication Sig Start Date End Date Taking? Authorizing Provider  aspirin EC 325 MG tablet Take 325 mg by mouth daily. 03/27/12  Yes Historical Provider, MD  carbidopa-levodopa (SINEMET IR) 25-100 MG tablet Take 1  tablet by mouth 2 (two) times daily. 05/03/15  Yes Historical Provider, MD  gemfibrozil (LOPID) 600 MG tablet Take 1 tablet by mouth 2 (two) times daily. 05/03/15  Yes Historical Provider, MD  lovastatin (MEVACOR) 20 MG tablet Take 20 mg by mouth every evening. 03/27/12  Yes Historical Provider, MD  midodrine (PROAMATINE) 10 MG tablet Take 1 tablet by mouth every dialysis. 05/03/15  Yes Historical Provider, MD  Multiple Vitamins-Minerals (RENAPLEX-D PO) Take 1 tablet by mouth every morning.   Yes Historical Provider, MD  omeprazole (PRILOSEC) 40 MG capsule Take 1 capsule by mouth daily. 05/30/15  Yes Historical Provider, MD  SENSIPAR 30 MG tablet Take 1 tablet by mouth daily. 05/17/15  Yes Historical Provider, MD   Dg Ankle Complete Left  05/30/2015  CLINICAL DATA:  81 year old male status post fall in shower with left ankle deformity now splinted. Initial encounter. EXAM: LEFT ANKLE COMPLETE - 3+ VIEW COMPARISON:  None. FINDINGS: Comminuted spiral fracture of the distal left fibula meta diaphysis extending to the level of the lateral joint space. Transverse fracture through the medial malleolus at the level of the tibial plafond. Superimposed possible acute avulsion fracture at the tip of the medial malleolus. Comminuted fracture also suspected through the posterior malleolus on the lateral view although bone detail there is limited by splint material. Talar dome intact. Mortise joint alignment relatively preserved. No other acute fracture identified. IMPRESSION: Acute Trimalleolar fracture status post splint placement with relatively preserved mortise joint alignment. Electronically Signed   By: HRexene Edison  Margo Aye M.D.   On: 05/30/2015 09:44    Positive ROS: All other systems have been reviewed and were otherwise negative with the exception of those mentioned in the HPI and as above.  Physical Exam: General:  Alert, no acute distress Psychiatric:  Patient is competent for consent with normal mood and affect    Cardiovascular:  No pedal edema Respiratory:  No wheezing, non-labored breathing GI:  Abdomen is soft and non-tender Skin:  No lesions in the area of chief complaint Neurologic:  Sensation intact distally Lymphatic:  No axillary or cervical lymphadenopathy  Orthopedic Exam:  Orthopedic examination is limited to the left lower extremity and foot. The patient is in a posterior splint with a sugar tong suppled, maintaining his ankle in neutral dorsiflexion. Skin is intact at the proximal distal margins of the cast. He is able to dorsiflex and plantarflex his toes. He has fair capillary refill to his toes. Sensation is decreased diffusely over both lower extremities and feet due to his underlying peripheral neuropathy.  X-rays:  Postreduction x-rays of the left ankle are available for review. These films demonstrate fractures of the medial, lateral, and posterior malleoli with overall excellent reduction of the mortise. The distal fibular fracture is oblique and several millimeters displaced. The medial malleolar fracture is anatomically reduced. The posterior malleolar fragment is very small and involved less than 10% of the articular surface. No significant degenerative changes are noted at this time.  Assessment: Unstable trimalleolar fracture dislocation left ankle.  Plan: The treatment options were discussed with the patient and his wife, who is at the bedside. Given how unstable the fracture is, I feel that the patient would best be managed with an open reduction and internal fixation of the medial and lateral malleolar fractures. This procedure has been discussed in detail with the patient and his wife, as have the potential risks (including bleeding, infection, nerve and/or blood vessel injury, persistent or recurrent pain, stiffness of the ankle, element of degenerative joint disease of the ankle, need for further surgery, blood clots, strokes, heart attacks and/or arrhythmias, etc.) and  benefits. The patient states understanding and wishes to proceed. A formal written consent will be obtained by the nursing staff. I understand that the patient needs to be cleared medically and by cardiology prior to proceeding with surgery. Therefore, I will plan on doing this procedure tomorrow afternoon, assuming the patient has been cleared medically. In the meantime, please keep his leg elevated and apply ice to the anterior ankle region in order to minimize his swelling.  Thank you for asking me to participate in the care of this most pleasant unfortunate man. I will be happy to follow him with you.   Maryagnes Amos, MD  Beeper #:  [phone]  05/30/2015 5:28 PM

## 2015-05-30 NOTE — Consult Note (Signed)
Reason for Consult: preoperative evaluation for surgery Referring Physician:  Dr. Benjie Karvonen,  Dr. Tommy Rainwater is an 81 y.o. male.  HPI:  81 year old white male history of multiple medical problems end-stage renal disease on dialysis Monday Wednesday Friday coronary artery disease coronary bypass surgery sick sinus syndrome status post permanent pacemaker Parkinson's disease, history of CVA myocardial infarction obstructive sleep apnea hyperlipidemia diabetes anemia the patient states he fell in the bathroom and fractured his ankle now needs surgery because of his abnormal EKG needs preoperative assessment. Patient denies any chest pain or significant shortness of breath or palpitations paces states be compliant with his medications  Past Medical History  Diagnosis Date  . Renal disorder   . Stroke ([place])   . Coronary artery disease   . Myocardial infarction ([place])   . Parkinson disease ([place])   . Chronic home hemodialysis status [facility])     Past Surgical History  Procedure Laterality Date  . Pacemaker insertion      No family history on file.  Social History:  reports that he quit smoking about 46 years ago. He does not have any smokeless tobacco history on file. He reports that he does not drink alcohol or use illicit drugs.  Allergies:  Allergies  Allergen Reactions  . Penicillins Swelling    Medications: I have reviewed the patient's current medications.  Results for orders placed or performed during the hospital encounter of 05/30/15 (from the past 48 hour(s))  CBC with Differential     Status: Abnormal   Collection Time: 05/30/15 10:12 AM  Result Value Ref Range   WBC 15.6 (H) 3.8 - 10.6 K/uL   RBC 3.67 (L) 4.40 - 5.90 MIL/uL   Hemoglobin 11.6 (L) 13.0 - 18.0 g/dL   HCT 36.2 (L) 40.0 - 52.0 %   MCV 98.4 80.0 - 100.0 fL   MCH 31.5 26.0 - 34.0 pg   MCHC 32.0 32.0 - 36.0 g/dL   RDW 15.6 (H) 11.5 - 14.5 %   Platelets 265 150 - 440 K/uL   Neutrophils Relative % 87 %    Neutro Abs 13.5 (H) 1.4 - 6.5 K/uL   Lymphocytes Relative 7 %   Lymphs Abs 1.1 1.0 - 3.6 K/uL   Monocytes Relative 5 %   Monocytes Absolute 0.8 0.2 - 1.0 K/uL   Eosinophils Relative 1 %   Eosinophils Absolute 0.1 0 - 0.7 K/uL   Basophils Relative 0 %   Basophils Absolute 0.0 0 - 0.1 K/uL  Comprehensive metabolic panel     Status: Abnormal   Collection Time: 05/30/15 10:12 AM  Result Value Ref Range   Sodium 142 135 - 145 mmol/L   Potassium 6.1 (H) 3.5 - 5.1 mmol/L   Chloride 98 (L) 101 - 111 mmol/L   CO2 29 22 - 32 mmol/L   Glucose, Bld 93 65 - 99 mg/dL   BUN 45 (H) 6 - 20 mg/dL   Creatinine, Ser 8.52 (H) 0.61 - 1.24 mg/dL   Calcium 9.8 8.9 - 10.3 mg/dL   Total Protein 8.0 6.5 - 8.1 g/dL   Albumin 3.9 3.5 - 5.0 g/dL   AST 16 15 - 41 U/L   ALT 5 (L) 17 - 63 U/L   Alkaline Phosphatase 93 38 - 126 U/L   Total Bilirubin 0.8 0.3 - 1.2 mg/dL   GFR calc non Af Amer 5 (L) >60 mL/min   GFR calc Af Amer 6 (L) >60 mL/min    Comment: (NOTE)  The eGFR has been calculated using the CKD EPI equation. This calculation has not been validated in all clinical situations. eGFR's persistently <60 mL/min signify possible Chronic Kidney Disease.    Anion gap 15 5 - 15  Troponin I     Status: Abnormal   Collection Time: 05/30/15 10:12 AM  Result Value Ref Range   Troponin I 0.06 (H) <0.031 ng/mL    Comment: READ BACK AND VERIFIED WITH NELLIE MONAR AT 1133 ON 05/30/15 BY QSD        PERSISTENTLY INCREASED TROPONIN VALUES IN THE RANGE OF 0.04-0.49 ng/mL CAN BE SEEN IN:       -UNSTABLE ANGINA       -CONGESTIVE HEART FAILURE       -MYOCARDITIS       -CHEST TRAUMA       -ARRYHTHMIAS       -LATE PRESENTING MYOCARDIAL INFARCTION       -COPD   CLINICAL FOLLOW-UP RECOMMENDED.   Protime-INR     Status: None   Collection Time: 05/30/15 10:12 AM  Result Value Ref Range   Prothrombin Time 14.8 11.4 - 15.0 seconds   INR 1.14   Troponin I     Status: Abnormal   Collection Time: 05/30/15 12:32 PM   Result Value Ref Range   Troponin I 0.05 (H) <0.031 ng/mL    Comment:        PERSISTENTLY INCREASED TROPONIN VALUES IN THE RANGE OF 0.04-0.49 ng/mL CAN BE SEEN IN:       -UNSTABLE ANGINA       -CONGESTIVE HEART FAILURE       -MYOCARDITIS       -CHEST TRAUMA       -ARRYHTHMIAS       -LATE PRESENTING MYOCARDIAL INFARCTION       -COPD   CLINICAL FOLLOW-UP RECOMMENDED.     Dg Ankle Complete Left  05/30/2015  CLINICAL DATA:  82 year old male status post fall in shower with left ankle deformity now splinted. Initial encounter. EXAM: LEFT ANKLE COMPLETE - 3+ VIEW COMPARISON:  None. FINDINGS: Comminuted spiral fracture of the distal left fibula meta diaphysis extending to the level of the lateral joint space. Transverse fracture through the medial malleolus at the level of the tibial plafond. Superimposed possible acute avulsion fracture at the tip of the medial malleolus. Comminuted fracture also suspected through the posterior malleolus on the lateral view although bone detail there is limited by splint material. Talar dome intact. Mortise joint alignment relatively preserved. No other acute fracture identified. IMPRESSION: Acute Trimalleolar fracture status post splint placement with relatively preserved mortise joint alignment. Electronically Signed   By: Genevie Ann M.D.   On: 05/30/2015 09:44    Review of Systems  Constitutional: Positive for malaise/fatigue.  HENT: Positive for congestion.   Eyes: Negative.   Respiratory: Positive for shortness of breath.   Cardiovascular: Positive for orthopnea and leg swelling.  Gastrointestinal: Negative.   Genitourinary: Negative.   Musculoskeletal: Positive for falls.  Skin: Negative.   Neurological: Positive for tremors and weakness.  Endo/Heme/Allergies: Negative.   Psychiatric/Behavioral: The patient is nervous/anxious.    Blood pressure 113/51, pulse 78, temperature 98.3 F (36.8 C), temperature source Oral, resp. rate 16, height 5' 9"   (1.753 m), weight 93.895 kg (207 lb), SpO2 97 %. Physical Exam  Nursing note and vitals reviewed. Constitutional: He appears well-developed and well-nourished.  HENT:  Head: Normocephalic and atraumatic.  Eyes: Conjunctivae and EOM are normal. Pupils are equal, round, and  reactive to light.  Neck: Normal range of motion. Neck supple.  Cardiovascular: Normal rate and regular rhythm.  Exam reveals gallop.   Murmur heard. Respiratory: Effort normal and breath sounds normal.  GI: Soft. Bowel sounds are normal.  Musculoskeletal: Normal range of motion.  Neurological: He is alert. He displays tremor. Coordination abnormal.  Skin: Skin is warm and dry.  Psychiatric: He has a normal mood and affect.    Assessment/Plan:   preop  For surgery  history of CVA  coronary artery disease  history of myocardial infarction  coronary bypass surgery  sick sinus syndrome with permanent pacemaker  Parkinson disease  tremors probably related to Parkinson's  hypertension  diabetes type 2 uncomplicated  anemia of chronic disease  end-stage renal disease on dialysis  hyperlipidemia related to end-stage renal disease  hyperlipidemia  PLAN   preop for orthopedic surgery  Should be an acceptable surgical risk  end-stage renal disease recommend continue dialysis  hypokalemia recommend immediate medical therapy including calcium bicarbonate and Kayexalate  history of CVA continue aspirin for anticoagulation  type 2 diabetes currently on insulin NovoLog  Parkinson's disease continue Sinemet therapy  tremors probably related to Parkinson's continue current therapy  GERD recommend Protonix for reflux  Symptoms  hyperlipidemia maintained on Pravachol therapy  hypertension continue Coreg  Alicya Bena D. 05/30/2015, 3:21 PM

## 2015-05-30 NOTE — Progress Notes (Signed)
Per patient and family members patient is not a diabetic. Informed Dr mody and requested to dc insulin orders. Orders recieved

## 2015-05-30 NOTE — H&P (Addendum)
[facility] - [place] at [facility]   PATIENT NAME: [name]    MR#:  [id number]  DATE OF BIRTH:  12/10/1928  DATE OF ADMISSION:  05/30/2015  PRIMARY CARE PHYSICIAN: SPARKS,JEFFREY D, MD   REQUESTING/REFERRING PHYSICIAN: Dr. Mayford Knife  CHIEF COMPLAINT:  Ankle pain HISTORY OF PRESENT ILLNESS:  [name]  is a 81 y.o. male with a known history of end-stage renal disease on hemodialysis and coronary artery disease who presents with above complaint. Patient states after shower he was getting out of the bathtub when he had an accidental fall. He denies hitting his head. He denies chest pain, syncope or shortness of breath prior or after the event.  She has not had chest pain for several years. Patient has not used nitroglycerin for several years. Patient denies shortness of breath or fevers recently. Patient is able to take care of his ADLs. He walks with a walker.  PAST MEDICAL HISTORY:   Past Medical History  Diagnosis Date  .  end-stage renal disease on hemodialysis with fistula in left arm    . Stroke (HCC)   . Coronary artery disease   . Myocardial infarction (HCC)   . Parkinson disease (HCC)   .  New York Heart Association class III chronic systolic heart failure    Obstructive sleep apnea Hyperlipidemia Essential hypertension   Anemia of chronic disease  PAST SURGICAL HISTORY:   Past Surgical History  Procedure Laterality Date  . Pacemaker insertion      SOCIAL HISTORY:   Social History  Substance Use Topics  . Smoking status: Former Smoker    Quit date: 05/24/1969  . Smokeless tobacco: Not on file  . Alcohol Use: No    FAMILY HISTORY:  No history of hypertension and diabetes  DRUG ALLERGIES:   Allergies  Allergen Reactions  . Penicillins Swelling     REVIEW OF SYSTEMS:  CONSTITUTIONAL: No fever, fatigue or weakness.  EYES: No blurred or double vision.  EARS, NOSE, AND THROAT: No tinnitus or ear pain.  RESPIRATORY: No  cough, shortness of breath, wheezing or hemoptysis.  CARDIOVASCULAR: No chest pain, orthopnea, edema.  GASTROINTESTINAL: No nausea, vomiting, diarrhea or abdominal pain.  GENITOURINARY: No dysuria, hematuria.  ENDOCRINE: No polyuria, nocturia,  HEMATOLOGY: No anemia, easy bruising or bleeding SKIN: No rash or lesion. MUSCULOSKELETAL: ++ ankle pain  NEUROLOGIC: No tingling, numbness, weakness.  PSYCHIATRY: No anxiety or depression.   MEDICATIONS AT HOME:   Prior to Admission medications   Medication Sig Start Date End Date Taking? Authorizing Provider  aspirin EC 325 MG tablet Take 325 mg by mouth daily. 03/27/12  Yes Historical Provider, MD  carbidopa-levodopa (SINEMET IR) 25-100 MG tablet Take 1 tablet by mouth 2 (two) times daily. 05/03/15  Yes Historical Provider, MD  gemfibrozil (LOPID) 600 MG tablet Take 1 tablet by mouth 2 (two) times daily. 05/03/15  Yes Historical Provider, MD  lovastatin (MEVACOR) 20 MG tablet Take 20 mg by mouth every evening. 03/27/12  Yes Historical Provider, MD  midodrine (PROAMATINE) 10 MG tablet Take 1 tablet by mouth every dialysis. 05/03/15  Yes Historical Provider, MD  omeprazole (PRILOSEC) 40 MG capsule Take 1 capsule by mouth daily. 05/30/15  Yes Historical Provider, MD  SENSIPAR 30 MG tablet Take 1 tablet by mouth daily. 05/17/15  Yes Historical Provider, MD      VITAL SIGNS:  Blood pressure 124/53, pulse 69, temperature 97.7 F (36.5 C), temperature source Oral, height  (1.753 m), weight 93.895  kg (207 lb), SpO2 97 %.  PHYSICAL EXAMINATION:  GENERAL:  81 y.o.-year-old patient lying in the bed with mild acute distress from pain.  EYES: Pupils equal, round, reactive to light and accommodation. No scleral icterus. Extraocular muscles intact.  HEENT: Head atraumatic, normocephalic. Oropharynx and nasopharynx clear.  NECK:  Supple, no jugular venous distention. No thyroid enlargement, no tenderness.  LUNGS: Normal breath sounds bilaterally, no  wheezing, rales,rhonchi or crepitation. No use of accessory muscles of respiration.  CARDIOVASCULAR: S1, S2 normal. No murmurs, rubs, or gallops.  ABDOMEN: Soft, nontender, nondistended. Bowel sounds present. No organomegaly or mass.  EXTREMITIES: No pedal edema, cyanosis, or clubbing. Left ankle splinted NEUROLOGIC: Cranial nerves II through XII are grossly intact. No focal deficits. Right hand tremor PSYCHIATRIC: The patient is alert and oriented x 3.  SKIN: No obvious rash, lesion, or ulcer.   LABORATORY PANEL:   CBC  Recent Labs Lab 05/30/15 1012  WBC 15.6*  HGB 11.6*  HCT 36.2*  PLT 265   ------------------------------------------------------------------------------------------------------------------  Chemistries   Recent Labs Lab 05/30/15 1012  NA 142  K 6.1*  CL 98*  CO2 29  GLUCOSE 93  BUN 45*  CREATININE 8.52*  CALCIUM 9.8  AST 16  ALT 5*  ALKPHOS 93  BILITOT 0.8   ------------------------------------------------------------------------------------------------------------------  Cardiac Enzymes  Recent Labs Lab 05/30/15 1012  TROPONINI 0.06*   ------------------------------------------------------------------------------------------------------------------  RADIOLOGY:  Dg Ankle Complete Left  05/30/2015  CLINICAL DATA:  81 year old male status post fall in shower with left ankle deformity now splinted. Initial encounter. EXAM: LEFT ANKLE COMPLETE - 3+ VIEW COMPARISON:  None. FINDINGS: Comminuted spiral fracture of the distal left fibula meta diaphysis extending to the level of the lateral joint space. Transverse fracture through the medial malleolus at the level of the tibial plafond. Superimposed possible acute avulsion fracture at the tip of the medial malleolus. Comminuted fracture also suspected through the posterior malleolus on the lateral view although bone detail there is limited by splint material. Talar dome intact. Mortise joint alignment  relatively preserved. No other acute fracture identified. IMPRESSION: Acute Trimalleolar fracture status post splint placement with relatively preserved mortise joint alignment. Electronically Signed   By: Odessa FlemingH  Hall M.D.   On: 05/30/2015 09:44    EKG:   Paced rhythm no st elevation or peaked t waves  IMPRESSION AND PLAN:    81 year old male with end-stage renal disease on hemodialysis Monday, Wednesday and Friday and coronary disease who presents after mechanical fall and noted to have a trimalleolar fracture of the left ankle.  1. Hyperkalemia: This is likely secondary to his renal disease.  2. Elevated troponin: I suspect this is related to poor renal clearance. I will check 2 more troponins. Patient denies any chest pain or syncopal event. Due to his history of coronary artery disease and the fact the patient has a trimalleolar fracture and may go for surgery, cardiology consultation has been placed. 3. Trimalleolar fracture: Patient will need to undergo surgery. I will need to wait for his potassium and troponin to be rechecked and then we'll have a better idea of his preop status.  4. End-stage renal disease and hematemesis: Nephrology will be consulted. Patient will undergo dialysis today.   5. History of coronary artery disease status post stenting: Continue aspirin. Cardiology consultation for preop clearance. Low-dose beta blocker may be beneficial. Patient does have a pacemaker that was interrogated July this year.  6. Parkinson's disease: Continue his outpatient medications.  7. Hyperlipidemia: Continue gemfibrozil and  statin.     All the records are reviewed and case discussed with ED provider. Management plans discussed with the patient and he is in agreement.  CODE STATUS: FULL  TOTAL TIME TAKING CARE OF THIS PATIENT: 45 minutes.    Redell Nazir M.D on 05/30/2015 at 11:48 AM  Between 7am to 6pm - Pager - [phone] After 6pm go to [email or web address] - password [email or web address]  [facility]  [place] [facility]  Office  [phone]  CC: Primary care physician; Marguarite Arbour, MD

## 2015-05-30 NOTE — ED Provider Notes (Addendum)
[facility] Emergency Department Provider Note     Time seen: ----------------------------------------- 9:05 AM on 05/30/2015 -----------------------------------------    I have reviewed the triage vital signs and the nursing notes.   HISTORY  Chief Complaint Fall and Foot Injury    HPI [name] is a 81 y.o. male who presents to ER after he fell reportedly getting out of the bathtub. Patient states he swung his leg over the side and he did not support his weight and he fell. He denies hitting his head, he was alert and oriented, has no complaints other than this left ankle. Does have history of end-stage renal disease.   No past medical history on file.  There are no active problems to display for this patient.   No past surgical history on file.  Allergies Review of patient's allergies indicates not on file.  Social History Social History  Substance Use Topics  . Smoking status: Not on file  . Smokeless tobacco: Not on file  . Alcohol Use: Not on file    Review of Systems Cardiovascular: Negative for chest pain. Respiratory: Negative for shortness of breath. Genitourinary: Negative for dysuria. Musculoskeletal: Positive for left ankle pain Skin: Positive for skin tear to the left leg ____________________________________________   PHYSICAL EXAM:  VITAL SIGNS: ED Triage Vitals  Enc Vitals Group     BP --      Pulse --      Resp --      Temp --      Temp src --      SpO2 --      Weight --      Height --      Head Cir --      Peak Flow --      Pain Score --      Pain Loc --      Pain Edu? --      Excl. in GC? --     Constitutional: Alert and oriented. Well appearing and in no distress. ENT   Head: Normocephalic and atraumatic. Cardiovascular: Normal rate, regular rhythm. No murmurs, rubs, or gallops. Respiratory: Normal respiratory effort without tachypnea nor retractions. Breath sounds are clear and equal  bilaterally.  Gastrointestinal: Soft and nontender. No distention. No abdominal bruits.  Musculoskeletal: Left ankle deformity is present, with a left ankle angulated and rotated outward. Good pulses are present in the left foot Neurologic:  Normal speech and language. Resting tremor is noted. Skin: Bleeding skin tag is noted to the proximal lower leg ____________________________________________  ED COURSE:  Pertinent labs & imaging results that were available during my care of the patient were reviewed by me and considered in my medical decision making (see chart for details). Patient's no acute distress, I have realigned his ankle and placed a posterior and stirrup splint in place. Ankle is no midline, kept at 90 in the splint. SPLINT APPLICATION Date/Time: 9:08 AM Authorized by: Daryel NovemberWilliams, Regie Bunner E Consent: Verbal consent obtained. Risks and benefits: risks, benefits and alternatives were discussed Consent given by: patient Splint applied by: Emergency physician Location details: Left ankle  Splint type: Posterior short leg and stirrup splint  Supplies used: 4 inch Ortho-Glass  Post-procedure: The splinted body part was neurovascularly unchanged following the procedure. Patient tolerance: Patient tolerated the procedure well with no immediate complications.  RADIOLOGY Images were viewed by me  Left ankle x-rays reveal IMPRESSION: Acute Trimalleolar fracture status post splint placement with relatively preserved mortise joint alignment.  Labs Reviewed  CBC WITH DIFFERENTIAL/PLATELET - Abnormal; Notable for the following:    WBC 15.6 (*)    RBC 3.67 (*)    Hemoglobin 11.6 (*)    HCT 36.2 (*)    RDW 15.6 (*)    Neutro Abs 13.5 (*)    All other components within normal limits  COMPREHENSIVE METABOLIC PANEL - Abnormal; Notable for the following:    Potassium 6.1 (*)    Chloride 98 (*)    BUN 45 (*)    Creatinine, Ser 8.52 (*)    ALT 5 (*)    GFR calc non Af Amer 5 (*)     GFR calc Af Amer 6 (*)    All other components within normal limits  TROPONIN I - Abnormal; Notable for the following:    Troponin I 0.06 (*)    All other components within normal limits  PROTIME-INR  POTASSIUM  TROPONIN I  TROPONIN I  TROPONIN I    ____________________________________________  FINAL ASSESSMENT AND PLAN  Fall, left trimalleolar fracture, hyperkalemia, end-stage renal disease  Plan: Patient with labs and imaging as dictated above. Patient's been appropriately splinted, will discuss with orthopedics.   Emily Filbert, MD  I discussed with Dr. Margaretann Loveless has requested medicine to admit the patient. I began treating potassium with insulin, D50, Kayexalate, sodium bicarbonate. Troponin is likely chronically elevated from end-stage renal disease, he will need nephrology follow-up as well hospital. He does have an unstable ankle fracture and will need surgery Emily Filbert, MD 05/30/15 1610  Emily Filbert, MD 05/30/15 1224

## 2015-05-30 NOTE — Progress Notes (Signed)
Pre-hd tx 

## 2015-05-30 NOTE — ED Notes (Signed)
Physician in with patient stabilizing Left ankle.

## 2015-05-30 NOTE — Progress Notes (Signed)
Post hd tx 

## 2015-05-30 NOTE — ED Notes (Signed)
Pt was at his residence getting out of the shower when he reportedly fell into shower.  Pt has obvious angulation of Left ankle.  Pt denies hitting head and is alert and oriented for EMS and on arrival to ED.  No obvious lacerations or marks on head.  Pt lives with spouse.

## 2015-05-30 NOTE — Progress Notes (Signed)
Consent for surgery and dialysis done.

## 2015-05-30 NOTE — Progress Notes (Addendum)
rn spoke with dr mody re: coreg -med pt does not take and lower strength of aspirin. md reports med is prophylactic d/t elevated troponin  And lower dose aspirin is for surgery

## 2015-05-30 NOTE — Progress Notes (Signed)
HD tx start 

## 2015-05-31 ENCOUNTER — Inpatient Hospital Stay: Payer: Medicare Other

## 2015-05-31 ENCOUNTER — Inpatient Hospital Stay: Payer: Medicare Other | Admitting: Anesthesiology

## 2015-05-31 ENCOUNTER — Encounter
Admission: EM | Disposition: A | Discharge status: Skilled Nursing Facility | Payer: Self-pay | Source: Home / Self Care | Attending: Internal Medicine

## 2015-05-31 ENCOUNTER — Encounter: Payer: Self-pay | Admitting: Anesthesiology

## 2015-05-31 HISTORY — PX: ORIF ANKLE FRACTURE: SHX5408

## 2015-05-31 LAB — BASIC METABOLIC PANEL
ANION GAP: 10 (ref 5–15)
BUN: 29 mg/dL — AB (ref 6–20)
CALCIUM: 9.1 mg/dL (ref 8.9–10.3)
CHLORIDE: 98 mmol/L — AB (ref 101–111)
CO2: 32 mmol/L (ref 22–32)
CREATININE: 6.36 mg/dL — AB (ref 0.61–1.24)
GFR calc non Af Amer: 7 mL/min — ABNORMAL LOW (ref >60)
GFR, EST AFRICAN AMERICAN: 8 mL/min — AB (ref >60)
GLUCOSE: 99 mg/dL (ref 65–99)
POTASSIUM: 5 mmol/L (ref 3.5–5.1)
SODIUM: 140 mmol/L (ref 135–145)

## 2015-05-31 LAB — CBC
HCT: 31.9 % — ABNORMAL LOW (ref 40.0–52.0)
HEMOGLOBIN: 10.8 g/dL — AB (ref 13.0–18.0)
MCH: 33.1 pg (ref 26.0–34.0)
MCHC: 33.8 g/dL (ref 32.0–36.0)
MCV: 98 fL (ref 80.0–100.0)
PLATELETS: 229 10*3/uL (ref 150–440)
RBC: 3.26 MIL/uL — ABNORMAL LOW (ref 4.40–5.90)
RDW: 15.4 % — AB (ref 11.5–14.5)
WBC: 13.6 10*3/uL — ABNORMAL HIGH (ref 3.8–10.6)

## 2015-05-31 LAB — HEPATITIS B SURFACE ANTIGEN: Hepatitis B Surface Ag: NEGATIVE

## 2015-05-31 LAB — POTASSIUM: POTASSIUM: 4.3 mmol/L (ref 3.5–5.1)

## 2015-05-31 SURGERY — OPEN REDUCTION INTERNAL FIXATION (ORIF) ANKLE FRACTURE
Anesthesia: General | Laterality: Left

## 2015-05-31 MED ORDER — ENOXAPARIN SODIUM 30 MG/0.3ML ~~LOC~~ SOLN: 30.0000 mg | SUBCUTANEOUS | Status: DC

## 2015-05-31 MED ORDER — NEOMYCIN-POLYMYXIN B GU 40-200000 IR SOLN
Status: AC
  Filled 2015-05-31: qty 4

## 2015-05-31 MED ORDER — SUCCINYLCHOLINE CHLORIDE 20 MG/ML IJ SOLN
INTRAMUSCULAR | Status: DC | PRN
  Administered 2015-05-31: 100 mg via INTRAVENOUS

## 2015-05-31 MED ORDER — ACETAMINOPHEN 650 MG RE SUPP: 650.0000 mg | Freq: Four times a day (QID) | RECTAL | Status: DC | PRN

## 2015-05-31 MED ORDER — HEPARIN SODIUM (PORCINE) 5000 UNIT/ML IJ SOLN
5000.0000 [IU] | Freq: Two times a day (BID) | INTRAMUSCULAR | Status: DC
  Administered 2015-06-01 (×2): 5000 [IU] via SUBCUTANEOUS
  Filled 2015-05-31 (×2): qty 1

## 2015-05-31 MED ORDER — PHENYLEPHRINE HCL 10 MG/ML IJ SOLN
INTRAMUSCULAR | Status: DC | PRN
  Administered 2015-05-31 (×2): 100 ug via INTRAVENOUS
  Administered 2015-05-31: 200 ug via INTRAVENOUS
  Administered 2015-05-31: 100 ug via INTRAVENOUS

## 2015-05-31 MED ORDER — BISACODYL 10 MG RE SUPP
10.0000 mg | Freq: Every day | RECTAL | Status: DC | PRN
Start: 2015-05-31 — End: 2015-06-02

## 2015-05-31 MED ORDER — CLINDAMYCIN PHOSPHATE 900 MG/50ML IV SOLN
900.0000 mg | Freq: Four times a day (QID) | INTRAVENOUS | Status: AC
  Administered 2015-05-31 – 2015-06-01 (×3): 900 mg via INTRAVENOUS
  Filled 2015-05-31 (×3): qty 50

## 2015-05-31 MED ORDER — METOCLOPRAMIDE HCL 5 MG/ML IJ SOLN: 5.0000 mg | Freq: Three times a day (TID) | INTRAMUSCULAR | Status: DC | PRN

## 2015-05-31 MED ORDER — LIDOCAINE HCL (CARDIAC) 20 MG/ML IV SOLN
INTRAVENOUS | Status: DC | PRN
  Administered 2015-05-31: 30 mg via INTRAVENOUS

## 2015-05-31 MED ORDER — PROPOFOL 10 MG/ML IV BOLUS
INTRAVENOUS | Status: DC | PRN
  Administered 2015-05-31: 90 mg via INTRAVENOUS

## 2015-05-31 MED ORDER — HYDROMORPHONE HCL 1 MG/ML IJ SOLN: 1.0000 mg | INTRAMUSCULAR | Status: DC | PRN

## 2015-05-31 MED ORDER — GLYCOPYRROLATE 0.2 MG/ML IJ SOLN
INTRAMUSCULAR | Status: DC | PRN
  Administered 2015-05-31: 0.4 mg via INTRAVENOUS

## 2015-05-31 MED ORDER — ONDANSETRON HCL 4 MG PO TABS: 4.0000 mg | ORAL_TABLET | Freq: Four times a day (QID) | ORAL | Status: DC | PRN

## 2015-05-31 MED ORDER — FENTANYL CITRATE (PF) 100 MCG/2ML IJ SOLN
25.0000 ug | INTRAMUSCULAR | Status: DC | PRN
  Administered 2015-05-31 (×2): 25 ug via INTRAVENOUS

## 2015-05-31 MED ORDER — NEOSTIGMINE METHYLSULFATE 10 MG/10ML IV SOLN
INTRAVENOUS | Status: DC | PRN
  Administered 2015-05-31: 3 mg via INTRAVENOUS

## 2015-05-31 MED ORDER — SODIUM CHLORIDE 0.9 % IV SOLN
INTRAVENOUS | Status: DC | PRN
  Administered 2015-05-31: 18:00:00 via INTRAVENOUS

## 2015-05-31 MED ORDER — DIPHENHYDRAMINE HCL 12.5 MG/5ML PO ELIX: 12.5000 mg | ORAL_SOLUTION | ORAL | Status: DC | PRN

## 2015-05-31 MED ORDER — SODIUM CHLORIDE 0.9 % IV SOLN
INTRAVENOUS | Status: DC
  Administered 2015-05-31: 21:00:00 via INTRAVENOUS

## 2015-05-31 MED ORDER — NEOMYCIN-POLYMYXIN B GU 40-200000 IR SOLN
Status: DC | PRN
  Administered 2015-05-31: 4 mL

## 2015-05-31 MED ORDER — FENTANYL CITRATE (PF) 100 MCG/2ML IJ SOLN
INTRAMUSCULAR | Status: DC | PRN
  Administered 2015-05-31 (×2): 50 ug via INTRAVENOUS

## 2015-05-31 MED ORDER — BUPIVACAINE HCL 0.5 % IJ SOLN
INTRAMUSCULAR | Status: DC | PRN
  Administered 2015-05-31: 15 mL

## 2015-05-31 MED ORDER — METHYLPREDNISOLONE SODIUM SUCC 125 MG IJ SOLR
INTRAMUSCULAR | Status: DC | PRN
  Administered 2015-05-31: 62.5 mg via INTRAVENOUS

## 2015-05-31 MED ORDER — FLEET ENEMA 7-19 GM/118ML RE ENEM: 1.0000 | ENEMA | Freq: Once | RECTAL | Status: DC | PRN

## 2015-05-31 MED ORDER — ACETAMINOPHEN 325 MG PO TABS
650.0000 mg | ORAL_TABLET | Freq: Four times a day (QID) | ORAL | Status: DC | PRN
  Administered 2015-06-01: 650 mg via ORAL
  Filled 2015-05-31: qty 2

## 2015-05-31 MED ORDER — ONDANSETRON HCL 4 MG/2ML IJ SOLN: 4.0000 mg | Freq: Four times a day (QID) | INTRAMUSCULAR | Status: DC | PRN

## 2015-05-31 MED ORDER — DOCUSATE SODIUM 100 MG PO CAPS
100.0000 mg | ORAL_CAPSULE | Freq: Two times a day (BID) | ORAL | Status: DC
  Administered 2015-05-31 – 2015-06-01 (×2): 100 mg via ORAL
  Filled 2015-05-31 (×2): qty 1

## 2015-05-31 MED ORDER — MAGNESIUM HYDROXIDE 400 MG/5ML PO SUSP: 30.0000 mL | Freq: Every day | ORAL | Status: DC | PRN

## 2015-05-31 MED ORDER — RENA-VITE PO TABS
ORAL_TABLET | Freq: Every morning | ORAL | Status: DC
  Administered 2015-06-01 – 2015-06-02 (×2): 1 via ORAL
  Filled 2015-05-31 (×2): qty 1

## 2015-05-31 MED ORDER — DEXAMETHASONE SODIUM PHOSPHATE 10 MG/ML IJ SOLN
INTRAMUSCULAR | Status: DC | PRN
Start: 2015-05-31 — End: 2015-05-31
  Administered 2015-05-31: 10 mg via INTRAVENOUS

## 2015-05-31 MED ORDER — ONDANSETRON HCL 4 MG/2ML IJ SOLN: 4.0000 mg | Freq: Once | INTRAMUSCULAR | Status: DC | PRN

## 2015-05-31 MED ORDER — SODIUM CHLORIDE 0.9 % IV SOLN
10000.0000 ug | INTRAVENOUS | Status: DC | PRN
  Administered 2015-05-31: 20 ug/min via INTRAVENOUS

## 2015-05-31 MED ORDER — BUPIVACAINE HCL (PF) 0.5 % IJ SOLN
INTRAMUSCULAR | Status: AC
  Filled 2015-05-31: qty 30

## 2015-05-31 MED ORDER — METOCLOPRAMIDE HCL 5 MG PO TABS: 5.0000 mg | ORAL_TABLET | Freq: Three times a day (TID) | ORAL | Status: DC | PRN

## 2015-05-31 MED ORDER — ROCURONIUM BROMIDE 100 MG/10ML IV SOLN
INTRAVENOUS | Status: DC | PRN
  Administered 2015-05-31: 5 mg via INTRAVENOUS
  Administered 2015-05-31: 10 mg via INTRAVENOUS
  Administered 2015-05-31: 15 mg via INTRAVENOUS

## 2015-05-31 MED ORDER — FENTANYL CITRATE (PF) 100 MCG/2ML IJ SOLN
INTRAMUSCULAR | Status: AC
  Filled 2015-05-31: qty 2

## 2015-05-31 SURGICAL SUPPLY — 62 items
1.3 K-WIRE IMPLANT
2.7 CANNULATED DRILL BIT IMPLANT
BANDAGE ACE 4X5 VEL STRL LF (GAUZE/BANDAGES/DRESSINGS) ×3 IMPLANT
BANDAGE ACE 6X5 VEL STRL LF (GAUZE/BANDAGES/DRESSINGS) ×3 IMPLANT
BIT DRILL 2.8 FIBULA F/ROD (BIT) ×3
BIT DRILL 27 CANN QC (BIT) ×3 IMPLANT
BIT DRILL CANN FIBULA NONSTRL (BIT) ×3
BLADE SURG SZ10 CARB STEEL (BLADE) IMPLANT
BNDG COHESIVE 4X5 TAN STRL (GAUZE/BANDAGES/DRESSINGS) ×3 IMPLANT
BNDG ESMARK 6X12 TAN STRL LF (GAUZE/BANDAGES/DRESSINGS) ×3 IMPLANT
BNDG PLASTER FAST 4X5 WHT LF (CAST SUPPLIES) ×12 IMPLANT
CANISTER SUCT 1200ML W/VALVE (MISCELLANEOUS) ×3 IMPLANT
CHLORAPREP W/TINT 26ML (MISCELLANEOUS) ×6 IMPLANT
DRAPE C-ARM XRAY 36X54 (DRAPES) ×3 IMPLANT
DRAPE C-ARMOR (DRAPES) ×3 IMPLANT
DRAPE INCISE IOBAN 66X45 STRL (DRAPES) ×3 IMPLANT
DRAPE U-SHAPE 47X51 STRL (DRAPES) ×3 IMPLANT
DRILL CANN 6.1MM INTRA FIB NS (BIT) ×1 IMPLANT
DRILL SURG 2.8 FIBULA F/ROD (BIT) ×1 IMPLANT
ELECT CAUTERY BLADE 6.4 (BLADE) ×3 IMPLANT
GAUZE PETRO XEROFOAM 1X8 (MISCELLANEOUS) ×3 IMPLANT
GAUZE SPONGE 4X4 12PLY STRL (GAUZE/BANDAGES/DRESSINGS) ×3 IMPLANT
GLOVE BIO SURGEON STRL SZ7 (GLOVE) ×6 IMPLANT
GLOVE BIO SURGEON STRL SZ8 (GLOVE) ×6 IMPLANT
GLOVE INDICATOR 7.5 STRL GRN (GLOVE) ×3 IMPLANT
GLOVE INDICATOR 8.0 STRL GRN (GLOVE) ×3 IMPLANT
GOWN STRL REUS W/ TWL LRG LVL3 (GOWN DISPOSABLE) ×1 IMPLANT
GOWN STRL REUS W/ TWL XL LVL3 (GOWN DISPOSABLE) ×1 IMPLANT
GOWN STRL REUS W/TWL LRG LVL3 (GOWN DISPOSABLE) ×2
GOWN STRL REUS W/TWL XL LVL3 (GOWN DISPOSABLE) ×2
GUIDEWIRE ORTH 6X062XTROC NS (WIRE) ×1 IMPLANT
GUIDEWIRE ORTHO 1.3X150 NT (WIRE) ×6 IMPLANT
HEMOVAC 400ML (MISCELLANEOUS)
K-WIRE IMPLANT
K-WIRE .062 (WIRE) ×2
KIT DRAIN HEMOVAC JP 7FR 400ML (MISCELLANEOUS) IMPLANT
LABEL OR SOLS (LABEL) IMPLANT
NAIL FIBULA 3.6X110 TI STERILE (Nail) ×3 IMPLANT
NS IRRIG 1000ML POUR BTL (IV SOLUTION) ×3 IMPLANT
PACK EXTREMITY ARMC (MISCELLANEOUS) ×3 IMPLANT
PAD ABD DERMACEA PRESS 5X9 (GAUZE/BANDAGES/DRESSINGS) ×6 IMPLANT
PAD CAST CTTN 4X4 STRL (SOFTGOODS) ×2 IMPLANT
PAD GROUND ADULT SPLIT (MISCELLANEOUS) ×3 IMPLANT
PAD PREP 24X41 OB/GYN DISP (PERSONAL CARE ITEMS) ×3 IMPLANT
PADDING CAST 4IN STRL (MISCELLANEOUS) ×2
PADDING CAST BLEND 4X4 STRL (MISCELLANEOUS) ×1 IMPLANT
PADDING CAST COTTON 4X4 STRL (SOFTGOODS) ×4
SCREW CANNLT42X4 NS (Screw) ×2 IMPLANT
SCREW CANNULATED 4.0X42MM (Screw) ×4 IMPLANT
SCREW NON LOCKING HEX 3.5X22 (Screw) ×3 IMPLANT
SCREW NONLOCK 3.5X42MM (Screw) ×3 IMPLANT
SPONGE LAP 18X18 5 PK (GAUZE/BANDAGES/DRESSINGS) ×3 IMPLANT
STAPLER SKIN PROX 35W (STAPLE) ×3 IMPLANT
STOCKINETTE M/LG 89821 (MISCELLANEOUS) ×3 IMPLANT
STOCKINETTE STRL 6IN 960660 (GAUZE/BANDAGES/DRESSINGS) IMPLANT
STRAP SAFETY BODY (MISCELLANEOUS) ×3 IMPLANT
SUT PROLENE 4 0 PS 2 18 (SUTURE) IMPLANT
SUT VIC AB 0 CT1 36 (SUTURE) IMPLANT
SUT VIC AB 2-0 SH 27 (SUTURE) ×4
SUT VIC AB 2-0 SH 27XBRD (SUTURE) ×2 IMPLANT
SYRINGE 10CC LL (SYRINGE) ×3 IMPLANT
fibula rod (Rod) IMPLANT

## 2015-05-31 NOTE — Anesthesia Preprocedure Evaluation (Addendum)
Anesthesia Evaluation  Patient identified by MRN, date of birth, ID band Patient awake    Reviewed: Allergy & Precautions, NPO status , Patient's Chart, lab work & pertinent test results, reviewed documented beta blocker date and time   Airway Mallampati: III  TM Distance: <3 FB Neck ROM: Limited    Dental  (+) Chipped, Missing   Pulmonary former smoker,    Pulmonary exam normal breath sounds clear to auscultation       Cardiovascular + CAD and + Past MI  Normal cardiovascular exam     Neuro/Psych Parkinson's Dz CVA    GI/Hepatic Neg liver ROS, GERD  Medicated and Controlled,  Endo/Other  negative endocrine ROS  Renal/GU CRFRenal disease  negative genitourinary   Musculoskeletal Ankle fracture   Abdominal Normal abdominal exam  (+)   Peds negative pediatric ROS (+)  Hematology negative hematology ROS (+)   Anesthesia Other Findings   Reproductive/Obstetrics                            Anesthesia Physical Anesthesia Plan  ASA: IV  Anesthesia Plan: General   Post-op Pain Management:    Induction: Intravenous and Rapid sequence  Airway Management Planned: Oral ETT  Additional Equipment:   Intra-op Plan:   Post-operative Plan: Extubation in OR  Informed Consent: I have reviewed the patients History and Physical, chart, labs and discussed the procedure including the risks, benefits and alternatives for the proposed anesthesia with the patient or authorized representative who has indicated his/her understanding and acceptance.   Dental advisory given  Plan Discussed with: CRNA and Surgeon  Anesthesia Plan Comments: (Gentle GOT)       Anesthesia Quick Evaluation

## 2015-05-31 NOTE — Progress Notes (Signed)
Transported to OR with Family present

## 2015-05-31 NOTE — Anesthesia Procedure Notes (Signed)
Procedure Name: Intubation Date/Time: 05/31/2015 5:44 PM Performed by: Lily KocherPERALTA, William Canby Pre-anesthesia Checklist: Patient identified, Patient being monitored, Timeout performed, Emergency Drugs available and Suction available Patient Re-evaluated:Patient Re-evaluated prior to inductionOxygen Delivery Method: Circle system utilized Preoxygenation: Pre-oxygenation with 100% oxygen Intubation Type: IV induction Ventilation: Two handed mask ventilation required and Oral airway inserted - appropriate to patient size Laryngoscope Size: Mac and 4 Grade View: Grade III Tube type: Oral Tube size: 7.5 mm Number of attempts: 1 Airway Equipment and Method: Stylet Placement Confirmation: positive ETCO2 and breath sounds checked- equal and bilateral Secured at: 23 cm Tube secured with: Tape Dental Injury: Teeth and Oropharynx as per pre-operative assessment

## 2015-05-31 NOTE — Progress Notes (Signed)
[facility] - [place] at [facility]   PATIENT NAME: [name]    MR#:  [id number]  DATE OF BIRTH:  02/02/1929  SUBJECTIVE:   No issues overnight  REVIEW OF SYSTEMS:    Review of Systems  Constitutional: Negative for fever, chills and malaise/fatigue.  HENT: Negative for sore throat.   Eyes: Negative for blurred vision.  Respiratory: Negative for cough, hemoptysis, shortness of breath and wheezing.   Cardiovascular: Negative for chest pain, palpitations and leg swelling.  Gastrointestinal: Negative for nausea, vomiting, abdominal pain, diarrhea and blood in stool.  Genitourinary: Negative for dysuria.  Musculoskeletal: Positive for joint pain. Negative for back pain.  Neurological: Negative for dizziness, tremors and headaches.  Endo/Heme/Allergies: Does not bruise/bleed easily.    Tolerating Diet:       DRUG ALLERGIES:   Allergies  Allergen Reactions  . Penicillins Swelling    VITALS:  Blood pressure 156/131, pulse 81, temperature 98.6 F (37 C), temperature source Oral, resp. rate 18, height  (1.753 m), weight 93.8 kg (206 lb 12.7 oz), SpO2 99 %.  PHYSICAL EXAMINATION:   Physical Exam  Constitutional: He is oriented to person, place, and time and well-developed, well-nourished, and in no distress. No distress.  HENT:  Head: Normocephalic.  Eyes: No scleral icterus.  Neck: Normal range of motion. Neck supple. No JVD present. No tracheal deviation present.  Cardiovascular: Normal rate, regular rhythm and normal heart sounds.  Exam reveals no gallop and no friction rub.   No murmur heard. Pulmonary/Chest: Effort normal and breath sounds normal. No respiratory distress. He has no wheezes. He has no rales. He exhibits no tenderness.  Abdominal: Soft. Bowel sounds are normal. He exhibits no distension and no mass. There is no tenderness. There is no rebound and no guarding.  Musculoskeletal: Normal range of motion. He exhibits no  edema.  Neurological: He is alert and oriented to person, place, and time.  Tremor right hand  Skin: Skin is warm. No rash noted. No erythema.  Psychiatric: Affect and judgment normal.      LABORATORY PANEL:   CBC  Recent Labs Lab 05/31/15 0546  WBC 13.6*  HGB 10.8*  HCT 31.9*  PLT 229   ------------------------------------------------------------------------------------------------------------------  Chemistries   Recent Labs Lab 05/30/15 1012 05/31/15 0546 05/31/15 0950  NA 142 140  --   K 6.1* 5.0 4.3  CL 98* 98*  --   CO2 29 32  --   GLUCOSE 93 99  --   BUN 45* 29*  --   CREATININE 8.52* 6.36*  --   CALCIUM 9.8 9.1  --   AST 16  --   --   ALT 5*  --   --   ALKPHOS 93  --   --   BILITOT 0.8  --   --    ------------------------------------------------------------------------------------------------------------------  Cardiac Enzymes  Recent Labs Lab 05/30/15 1012 05/30/15 1232 05/30/15 1750  TROPONINI 0.06* 0.05* 0.07*   ------------------------------------------------------------------------------------------------------------------  RADIOLOGY:  Dg Ankle Complete Left  05/30/2015  CLINICAL DATA:  81 year old male status post fall in shower with left ankle deformity now splinted. Initial encounter. EXAM: LEFT ANKLE COMPLETE - 3+ VIEW COMPARISON:  None. FINDINGS: Comminuted spiral fracture of the distal left fibula meta diaphysis extending to the level of the lateral joint space. Transverse fracture through the medial malleolus at the level of the tibial plafond. Superimposed possible acute avulsion fracture at the tip of the medial malleolus. Comminuted fracture also suspected through the  posterior malleolus on the lateral view although bone detail there is limited by splint material. Talar dome intact. Mortise joint alignment relatively preserved. No other acute fracture identified. IMPRESSION: Acute Trimalleolar fracture status post splint placement with  relatively preserved mortise joint alignment. Electronically Signed   By: Odessa FlemingH  Hall M.D.   On: 05/30/2015 09:44     ASSESSMENT AND PLAN:    81 year old male with end-stage renal disease on hemodialysis Monday, Wednesday and Friday and coronary disease who presents after mechanical fall and noted to have a trimalleolar fracture of the left ankle.  1. Hyperkalemia: This is likely secondary to his renal disease. Potassium is improved after dialysis  2. Elevated troponin:  There is no indication for acute coronary syndrome. Troponins are flat. This is due to poor renal clearance  Appreciate cartilage consultation.   3. Trimalleolar fracture: Patient  Is moderate risk for moderate risk procedure. Patient may proceed to the OR today after dialysis.Marland Kitchen.  4. End-stage renal disease and hematemesis:  Hemodialysis as per nephrology. Consultation appreciated. Patient underwent hemodialysis on the day of admission and today.  5. History of coronary artery disease status post stenting: Continue aspirin. Continue low-dose beta blocker preop and postoperative period   6. Parkinson's disease: Continue his outpatient medications.  7. Hyperlipidemia: Continue gemfibrozil and statin   8. Pacemaker status: This is interrogated in July of this year.   Management plans discussed with the patient and he is in agreement.  CODE STATUS: FULL  TOTAL TIME TAKING CARE OF THIS PATIENT: 30 minutes.     POSSIBLE D/C 3-4 days, DEPENDING ON CLINICAL CONDITION.   Ambermarie Honeyman M.D on 05/31/2015 at 12:48 PM  Between 7am to 6pm - Pager - [phone] After 6pm go to [email or web address] - password [email or web address]  [facility]  Office  [phone] CC: Primary care physician; Marguarite ArbourSPARKS,JEFFREY D, MD  Note: This dictation was prepared with Dragon dictation along with smaller phrase technology. Any transcriptional errors that result from this process are unintentional.

## 2015-05-31 NOTE — Progress Notes (Signed)
Report given to Melody of Operating Room

## 2015-05-31 NOTE — Progress Notes (Signed)
ANTICOAGULATION CONSULT NOTE - Initial Consult  Pharmacy Consult for Heparin/Lovenox  Indication: VTE prophylaxis  Allergies  Allergen Reactions  . Penicillins Swelling    Patient Measurements: Height: 5\' 9"  (175.3 cm) Weight: 206 lb 12.7 oz (93.8 kg) IBW/kg (Calculated) : 70.7 Heparin Dosing Weight:   Vital Signs: Temp: 99.1 F (37.3 C) (12/07 2008) Temp Source: Oral (12/07 1333) BP: 92/42 mmHg (12/07 1333) Pulse Rate: 42 (12/07 1333)  Labs:  Recent Labs  05/30/15 1012 05/30/15 1232 05/30/15 1750 05/31/15 0546  HGB 11.6*  --   --  10.8*  HCT 36.2*  --   --  31.9*  PLT 265  --   --  229  LABPROT 14.8  --   --   --   INR 1.14  --   --   --   CREATININE 8.52*  --   --  6.36*  TROPONINI 0.06* 0.05* 0.07*  --     Estimated Creatinine Clearance: 9.8 mL/min (by C-G formula based on Cr of 6.36).   Medical History: Past Medical History  Diagnosis Date  . Renal disorder   . Stroke (HCC)   . Coronary artery disease   . Myocardial infarction (HCC)   . Parkinson disease (HCC)   . Chronic home hemodialysis status (HCC)     Medications:  Scheduled:  . aspirin  81 mg Oral Daily  . calcium acetate  1,334 mg Oral TID WC  . carbidopa-levodopa  1 tablet Oral BID  . carvedilol  3.125 mg Oral BID WC  . cinacalcet  30 mg Oral Daily  . clindamycin (CLEOCIN) IV  900 mg Intravenous Q6H  . docusate sodium  100 mg Oral BID  . fentaNYL      . gemfibrozil  600 mg Oral BID  . [START ON 06/01/2015] heparin subcutaneous  5,000 Units Subcutaneous Q12H  . [START ON 06/01/2015] multivitamin   Oral q morning - 10a  . pantoprazole  40 mg Oral Daily  . pravastatin  10 mg Oral q1800  . sodium chloride  3 mL Intravenous Q12H    Assessment: CrCl = 9.8 ml/min  Goal of Therapy:  DVT prophylaxis    Plan:  Lovenox 30 mg SQ Q24H ordered to start on 12/8 @ 8:00, will d/c lovenox and order Heparin 5000 units SQ Q12H to start 12/8 @ 8:00.   Anntonette Madewell D 05/31/2015,8:28 PM

## 2015-05-31 NOTE — Progress Notes (Signed)
[facility]  ROUNDING NOTE   Subjective:   Seen and examined on hemodialysis today. Hemodialysis treatment yesterday due to hyperkalemia Now hypotensive, Ultrafiltration now off  Objective:  Vital signs in last 24 hours:  Temp:  [98 F (36.7 C)-98.8 F (37.1 C)] 98.6 F (37 C) (12/07 0756) Pulse Rate:  [69-231] 81 (12/07 0756) Resp:  [16-26] 18 (12/07 0756) BP: (79-156)/(38-131) 156/131 mmHg (12/07 0756) SpO2:  [90 %-100 %] 99 % (12/07 0756) Weight:  [93.8 kg (206 lb 12.7 oz)-93.895 kg (207 lb)] 93.8 kg (206 lb 12.7 oz) (12/06 1930)  Weight change:  Filed Weights   05/30/15 0904 05/30/15 1635 05/30/15 1930  Weight: 93.895 kg (207 lb) 93.895 kg (207 lb) 93.8 kg (206 lb 12.7 oz)    Intake/Output: I/O last 3 completed shifts: In: 240 [P.O.:240] Out: 354 [Other:354]   Intake/Output this shift:     Physical Exam: General: NAD, laying in bed  Head: Normocephalic, atraumatic. Moist oral mucosal membranes  Eyes: Anicteric, PERRL  Neck: Supple, trachea midline  Lungs:  Clear to auscultation  Heart: Regular rate and rhythm  Abdomen:  Soft, nontender,   Extremities:  no peripheral edema, left ankle in brace  Neurologic: Resting tremor  Skin: No lesions  Access: Left arm AV    Basic Metabolic Panel:  Recent Labs Lab 05/30/15 1012 05/31/15 0546  NA 142 140  K 6.1* 5.0  CL 98* 98*  CO2 29 32  GLUCOSE 93 99  BUN 45* 29*  CREATININE 8.52* 6.36*  CALCIUM 9.8 9.1    Liver Function Tests:  Recent Labs Lab 05/30/15 1012  AST 16  ALT 5*  ALKPHOS 93  BILITOT 0.8  PROT 8.0  ALBUMIN 3.9   No results for input(s): LIPASE, AMYLASE in the last 168 hours. No results for input(s): AMMONIA in the last 168 hours.  CBC:  Recent Labs Lab 05/30/15 1012 05/31/15 0546  WBC 15.6* 13.6*  NEUTROABS 13.5*  --   HGB 11.6* 10.8*  HCT 36.2* 31.9*  MCV 98.4 98.0  PLT 265 229    Cardiac Enzymes:  Recent Labs Lab 05/30/15 1012 05/30/15 1232  05/30/15 1750  TROPONINI 0.06* 0.05* 0.07*    BNP: Invalid input(s): POCBNP  CBG: No results for input(s): GLUCAP in the last 168 hours.  Microbiology: No results found for this or any previous visit.  Coagulation Studies:  Recent Labs  05/30/15 1012  LABPROT 14.8  INR 1.14    Urinalysis: No results for input(s): COLORURINE, LABSPEC, PHURINE, GLUCOSEU, HGBUR, BILIRUBINUR, KETONESUR, PROTEINUR, UROBILINOGEN, NITRITE, LEUKOCYTESUR in the last 72 hours.  Invalid input(s): APPERANCEUR    Imaging: Dg Ankle Complete Left  05/30/2015  CLINICAL DATA:  81 year old male status post fall in shower with left ankle deformity now splinted. Initial encounter. EXAM: LEFT ANKLE COMPLETE - 3+ VIEW COMPARISON:  None. FINDINGS: Comminuted spiral fracture of the distal left fibula meta diaphysis extending to the level of the lateral joint space. Transverse fracture through the medial malleolus at the level of the tibial plafond. Superimposed possible acute avulsion fracture at the tip of the medial malleolus. Comminuted fracture also suspected through the posterior malleolus on the lateral view although bone detail there is limited by splint material. Talar dome intact. Mortise joint alignment relatively preserved. No other acute fracture identified. IMPRESSION: Acute Trimalleolar fracture status post splint placement with relatively preserved mortise joint alignment. Electronically Signed   By: Odessa Fleming M.D.   On: 05/30/2015 09:44     Medications:     .  aspirin  81 mg Oral Daily  . calcium acetate  1,334 mg Oral TID WC  . carbidopa-levodopa  1 tablet Oral BID  . carvedilol  3.125 mg Oral BID WC  . cinacalcet  30 mg Oral Daily  . clindamycin (CLEOCIN) IV  900 mg Intravenous Once  . gemfibrozil  600 mg Oral BID  . ondansetron (ZOFRAN) IV  4 mg Intravenous Once  . ondansetron  4 mg Oral Once  . oxyCODONE-acetaminophen  2 tablet Oral Once  . pantoprazole  40 mg Oral Daily  . pravastatin  10  mg Oral q1800  . sodium chloride  3 mL Intravenous Q12H   midodrine, morphine injection, oxyCODONE  Assessment/ Plan:  [name] is a 81 y.o. white male with Parkinson's, coronary artery disease, hyperlipidemia, hypertension, GERD  [facility] MWF [facility]  1. End Stage Renal Disease: MWF. Dialysis yesterday due to hyperkalemia - unclear why elevated today when he got dialysis yesterday.   2. Hypertension: well controlled.  - carvedilol  3. Secondary Hyperparathyroidism: PTH 489, phos 5.1 and Ca 9.3 as outpatient.  - calcium acetate 2 tabs with meals.   4. Anemia of chronic kidney disease: hemoglobin 10.8  Not indication for epo currently.    LOS: 1 Quinette Hentges 12/7/201610:56 AM

## 2015-05-31 NOTE — Care Management (Addendum)
I have notified [facility] liaison has been notified of patient admission. Patient was out of room when I rounded. List of home health agencies and this RNCM contact card left at bedside. RNCM will continue to follow.

## 2015-05-31 NOTE — Transfer of Care (Signed)
Immediate Anesthesia Transfer of Care Note  Patient: [name]  Procedure(s) Performed: Procedure(s): OPEN REDUCTION INTERNAL FIXATION TRIMALLEOLAR IANKLE FRACTURE DISLOCATION (Left)  Patient Location: PACU  Anesthesia Type:General  Level of Consciousness: awake, oriented and patient cooperative  Airway & Oxygen Therapy: Patient Spontanous Breathing and Patient connected to face mask oxygen  Post-op Assessment: Report given to RN and Post -op Vital signs reviewed and stable  Post vital signs: Reviewed and stable  Last Vitals:  Filed Vitals:   05/31/15 1239 05/31/15 1333  BP: 104/44 92/42  Pulse: 71 42  Temp:  36.9 C  Resp:  18    Complications: No apparent anesthesia complications

## 2015-05-31 NOTE — Op Note (Signed)
05/30/2015 - 05/31/2015  7:23 PM  Patient:   [name]  Pre-Op Diagnosis:   Trimalleolar fracture dislocation, left ankle.  Post-Op Diagnosis:   Same.  Procedure:   Open reduction and internal fixation of medial and lateral malleolar fractures, left ankle.  Surgeon:   Maryagnes AmosJ. Jeffrey Brendon Christoffel, MD  Assistant:   None  Anesthesia:   GET  Findings:   As above.  Complications:   None  EBL:   10 cc  Fluids:   300 cc crystalloid  UOP:   None  TT:   59 min at 300 mmHg  Drains:   None  Closure:   Staples  Implants:   Acumed 3.6 x 110 mm fibular nail and 4.0 x 42 mm cannulated cancellous screws 2  Brief Clinical Note:   The patient is an 81 year old male with multiple medical problems who lives at home with his wife. Apparently, he slipped and fell getting out of his bathtub yesterday morning. He was brought to the emergency room where x-rays demonstrated the above-noted fracture. The fracture was reduced and a posterior splint applied by the ER physician. The patient was admitted to the medicine service. He has been cleared medically and presents at this time for definitive management of his injury.  Procedure:   The patient was brought into the operating room. After adequate general endotracheal intubation and anesthesia was obtained, the patient's left foot and lower extremity were prepped with ChloraPrep solution, then draped sterilely. Preoperative antibiotics were administered. A timeout was performed to verify the appropriate surgical site before the limb was exsanguinated with an Esmarch and the thigh tourniquet inflated to 300 mmHg. Laterally, a 3 cm incision was made over the lateral aspect of the ankle, just below the tip of the distal fibula. The incision was carried down through the subcutaneous tissues to expose the distal portion of the fibula. A 0.062 K wire was inserted into the tip of the fibula and advanced across the fracture site. Its position was verified in both the AP  and lateral projections fluoroscopically before this guidewire was overreamed with the appropriate reamer. The 3.1 mm hand reamer was introduced, passed across the fracture and advanced into the distal fibula. Subsequently, the 3.7 mm hand reamer was advanced to approximately 140 mm. The Acumed 3.6 x 110 mm distal fibular nail was inserted and advanced to the appropriate depth. It was stabilized using a single 22 millimeter anterior-to-posteriorly directed screw in the distal fibula through a 1.5 cm stab incision anteriorly, using the appropriate guide system. After verifying its position fluoroscopically in AP and lateral projections, a 42 mm syndesmotic screw was placed from lateral to medial through the nail into the distal tibia, again using the appropriate guide system. The adequacy of fracture reduction and hardware position was verified fluoroscopically in AP and lateral projections and found to be excellent. In addition, the mortise was reduced anatomically, as was the medial malleolar fragment.  Attention was directed to the medial side. An approximately 3 cm longitudinal incision was made over the medial malleolus. This incision also was carried down through the subcutaneous tissues to expose the fracture site. Two guidewires were placed obliquely across the fracture from distal to proximal into the distal tibial metaphysis. After verifying their positions fluoroscopically, each guidewire was sequentially over-reamed and replaced with a 42 mm partially threaded 4.0 cancellous screw in lag fashion. Again the adequacy of fracture reduction, hardware position, and mortise restoration was verified in AP, lateral, and oblique projections and found  to be excellent.  Each wound was copiously irrigated with sterile saline solution. The subcutaneous tissues of each of the 4 incisions were closed using 2-0 Vicryl interrupted sutures before the skin was closed using staples. A total of 15 cc of 0.5% plain  Sensorcaine was injected in and around each of the incisions to help with postoperative analgesia. Sterile bulky dressings were applied to the wounds before the patient was placed into a posterior splint with a sugar tong supplement, maintaining the ankle in neutral dorsiflexion. The patient was then awakened, extubated, and returned to the recovery room in satisfactory condition after tolerating the procedure well.

## 2015-06-01 ENCOUNTER — Encounter
Admission: RE | Admit: 2015-06-01 | Discharge: 2015-06-01 | Disposition: A | Discharge status: Home / Self Care | Payer: Medicare Other | Source: Ambulatory Visit | Attending: Internal Medicine | Admitting: Internal Medicine

## 2015-06-01 LAB — BASIC METABOLIC PANEL
Anion gap: 14 (ref 5–15)
BUN: 25 mg/dL — AB (ref 6–20)
CHLORIDE: 97 mmol/L — AB (ref 101–111)
CO2: 29 mmol/L (ref 22–32)
CREATININE: 5.13 mg/dL — AB (ref 0.61–1.24)
Calcium: 9.2 mg/dL (ref 8.9–10.3)
GFR calc Af Amer: 11 mL/min — ABNORMAL LOW (ref >60)
GFR calc non Af Amer: 9 mL/min — ABNORMAL LOW (ref >60)
Glucose, Bld: 129 mg/dL — ABNORMAL HIGH (ref 65–99)
Potassium: 5 mmol/L (ref 3.5–5.1)
SODIUM: 140 mmol/L (ref 135–145)

## 2015-06-01 LAB — CBC
HCT: 31.1 % — ABNORMAL LOW (ref 40.0–52.0)
HEMOGLOBIN: 10 g/dL — AB (ref 13.0–18.0)
MCH: 31.8 pg (ref 26.0–34.0)
MCHC: 32.1 g/dL (ref 32.0–36.0)
MCV: 98.8 fL (ref 80.0–100.0)
PLATELETS: 213 10*3/uL (ref 150–440)
RBC: 3.15 MIL/uL — ABNORMAL LOW (ref 4.40–5.90)
RDW: 15.5 % — ABNORMAL HIGH (ref 11.5–14.5)
WBC: 12.5 10*3/uL — ABNORMAL HIGH (ref 3.8–10.6)

## 2015-06-01 MED ORDER — SENNOSIDES-DOCUSATE SODIUM 8.6-50 MG PO TABS
2.0000 | ORAL_TABLET | Freq: Two times a day (BID) | ORAL | Status: DC
  Administered 2015-06-01 – 2015-06-02 (×3): 2 via ORAL
  Filled 2015-06-01 (×3): qty 2

## 2015-06-01 MED ORDER — NEPRO/CARBSTEADY PO LIQD
237.0000 mL | ORAL | Status: DC
  Administered 2015-06-01 – 2015-06-02 (×2): 237 mL via ORAL

## 2015-06-01 NOTE — Progress Notes (Signed)
[facility]  ROUNDING NOTE   Subjective:   Hemodialysis three days in a row. No ultrafiltration on yesterday's treatment. Potassium now at goal. 5  Surgery yesterday: ORIF for left ankle. 12/7 Dr. Joice Lofts  Objective:  Vital signs in last 24 hours:  Temp:  [97.6 F (36.4 C)-99.1 F (37.3 C)] 98.6 F (37 C) (12/08 0836) Pulse Rate:  [42-220] 72 (12/08 0836) Resp:  [18] 18 (12/08 0836) BP: (92-128)/(38-86) 120/84 mmHg (12/08 0948) SpO2:  [72 %-99 %] 96 % (12/08 0836)  Weight change:  Filed Weights   05/30/15 0904 05/30/15 1635 05/30/15 1930  Weight: 93.895 kg (207 lb) 93.895 kg (207 lb) 93.8 kg (206 lb 12.7 oz)    Intake/Output: I/O last 3 completed shifts: In: 872.3 [P.O.:360; I.V.:412.3; IV Piggyback:100] Out: 354 [Other:354]   Intake/Output this shift:  Total I/O In: 120 [P.O.:120] Out: -   Physical Exam: General: NAD, laying in bed  Head: Normocephalic, atraumatic. Moist oral mucosal membranes  Eyes: Anicteric, PERRL  Neck: Supple, trachea midline  Lungs:  Clear to auscultation  Heart: Regular rate and rhythm  Abdomen:  Soft, nontender,   Extremities:  no peripheral edema, left ankle in caste  Neurologic: Resting tremor  Skin: No lesions  Access: Left arm AV    Basic Metabolic Panel:  Recent Labs Lab 05/30/15 1012 05/31/15 0546 05/31/15 0950 06/01/15 0547  NA 142 140  --  140  K 6.1* 5.0 4.3 5.0  CL 98* 98*  --  97*  CO2 29 32  --  29  GLUCOSE 93 99  --  129*  BUN 45* 29*  --  25*  CREATININE 8.52* 6.36*  --  5.13*  CALCIUM 9.8 9.1  --  9.2    Liver Function Tests:  Recent Labs Lab 05/30/15 1012  AST 16  ALT 5*  ALKPHOS 93  BILITOT 0.8  PROT 8.0  ALBUMIN 3.9   No results for input(s): LIPASE, AMYLASE in the last 168 hours. No results for input(s): AMMONIA in the last 168 hours.  CBC:  Recent Labs Lab 05/30/15 1012 05/31/15 0546 06/01/15 0547  WBC 15.6* 13.6* 12.5*  NEUTROABS 13.5*  --   --   HGB 11.6* 10.8* 10.0*   HCT 36.2* 31.9* 31.1*  MCV 98.4 98.0 98.8  PLT 265 229 213    Cardiac Enzymes:  Recent Labs Lab 05/30/15 1012 05/30/15 1232 05/30/15 1750  TROPONINI 0.06* 0.05* 0.07*    BNP: Invalid input(s): POCBNP  CBG: No results for input(s): GLUCAP in the last 168 hours.  Microbiology: No results found for this or any previous visit.  Coagulation Studies:  Recent Labs  05/30/15 1012  LABPROT 14.8  INR 1.14    Urinalysis: No results for input(s): COLORURINE, LABSPEC, PHURINE, GLUCOSEU, HGBUR, BILIRUBINUR, KETONESUR, PROTEINUR, UROBILINOGEN, NITRITE, LEUKOCYTESUR in the last 72 hours.  Invalid input(s): APPERANCEUR    Imaging: Dg Ankle 2 Views Left  05/31/2015  CLINICAL DATA:  Left ankle fracture.  ORIF. EXAM: LEFT ANKLE - 2 VIEW; DG C-ARM 61-120 MIN COMPARISON:  None. FINDINGS: Two intraoperative films show a intramedullary rod in the distal fibula. Fixation screws are seen across the interosseous ligament as well as in the medial and lateral malleolus. Fracture fragments are in anatomic alignment, and talus is centered within the ankle mortise. IMPRESSION: Internal fixation of ankle fracture in anatomic alignment. Electronically Signed   By: Myles Rosenthal M.D.   On: 05/31/2015 19:03   Dg C-arm 1-60 Min  05/31/2015  CLINICAL  DATA:  Left ankle fracture.  ORIF. EXAM: LEFT ANKLE - 2 VIEW; DG C-ARM 61-120 MIN COMPARISON:  None. FINDINGS: Two intraoperative films show a intramedullary rod in the distal fibula. Fixation screws are seen across the interosseous ligament as well as in the medial and lateral malleolus. Fracture fragments are in anatomic alignment, and talus is centered within the ankle mortise. IMPRESSION: Internal fixation of ankle fracture in anatomic alignment. Electronically Signed   By: Myles RosenthalJohn  Stahl M.D.   On: 05/31/2015 19:03     Medications:   . sodium chloride 10 mL/hr at 05/31/15 2046   . aspirin  81 mg Oral Daily  . calcium acetate  1,334 mg Oral TID WC  .  carbidopa-levodopa  1 tablet Oral BID  . carvedilol  3.125 mg Oral BID WC  . cinacalcet  30 mg Oral Daily  . docusate sodium  100 mg Oral BID  . feeding supplement (NEPRO CARB STEADY)  237 mL Oral Q24H  . gemfibrozil  600 mg Oral BID  . heparin subcutaneous  5,000 Units Subcutaneous Q12H  . multivitamin   Oral q morning - 10a  . pantoprazole  40 mg Oral Daily  . pravastatin  10 mg Oral q1800  . sodium chloride  3 mL Intravenous Q12H   acetaminophen **OR** acetaminophen, bisacodyl, diphenhydrAMINE, HYDROmorphone (DILAUDID) injection, metoCLOPramide **OR** metoCLOPramide (REGLAN) injection, midodrine, ondansetron **OR** ondansetron (ZOFRAN) IV, oxyCODONE, sodium phosphate  Assessment/ Plan:  [name] is a 81 y.o. white male with Parkinson's, coronary artery disease, hyperlipidemia, hypertension, GERD  [facility] MWF [facility]  1. End Stage Renal Disease: MWF. Dialysis yesterday. Tolerated treatment well. No UF.  Continue MWF schedule  2. Hypertension: well controlled.  - carvedilol  3. Secondary Hyperparathyroidism: PTH 489, phos 5.1 and Ca 9.3 as outpatient.  - calcium acetate 2 tabs with meals.   4. Anemia of chronic kidney disease: hemoglobin 10.8  Not indication for epo currently.    LOS: 2 Jaaziah Schulke 12/8/201612:06 PM

## 2015-06-01 NOTE — NC FL2 (Signed)
[place] MEDICAID FL2 LEVEL OF CARE SCREENING TOOL     IDENTIFICATION  Patient Name: [name] Birthdate: 06/24/1928 Sex: male Admission Date (Current Location): 05/30/2015  [place]     Facility and Address:  [facility], [address], [place], [place] [address]      Provider Number: [id number]  Attending Physician Name and Address:  Adrian SaranSital Mody, MD  Relative Name and Phone Number:       Current Level of Care: Hospital Recommended Level of Care: Skilled Nursing Facility Prior Approval Number:    Date Approved/Denied:   PASRR Number:  ( [id number] )  Discharge Plan: SNF    Current Diagnoses: Patient Active Problem List   Diagnosis Date Noted  . Trimalleolar fracture 05/30/2015    Orientation ACTIVITIES/SOCIAL BLADDER RESPIRATION    Self, Time, Situation, Place  Active Continent Normal  BEHAVIORAL SYMPTOMS/MOOD NEUROLOGICAL BOWEL NUTRITION STATUS   (none )  (none ) Continent Diet (Renal Diet Fluid Restrictions )  PHYSICIAN VISITS COMMUNICATION OF NEEDS Height & Weight Skin  30 days Verbally 5\' 9"  (175.3 cm) 206 lbs. Surgical wounds (Incision: Left Leg. )          AMBULATORY STATUS RESPIRATION    Assist extensive Normal      Personal Care Assistance Level of Assistance  Bathing, Feeding, Dressing Bathing Assistance: Limited assistance Feeding assistance: Independent Dressing Assistance: Limited assistance      Functional Limitations Info  Sight, Hearing, Speech Sight Info: Adequate Hearing Info: Adequate Speech Info: Adequate       SPECIAL CARE FACTORS FREQUENCY  PT (By licensed PT), OT (By licensed OT)     PT Frequency:  (5) OT Frequency:  (5)           Additional Factors Info  Code Status, Allergies Code Status Info:  (Full Code. ) Allergies Info:  (Penicillins)           Current Medications (06/01/2015):  This is the current hospital active medication list Current  Facility-Administered Medications  Medication Dose Route Frequency Provider Last Rate Last Dose  . 0.9 %  sodium chloride infusion   Intravenous Continuous Christena FlakeJohn J Poggi, MD   Stopped at 06/01/15 1217  . acetaminophen (TYLENOL) tablet 650 mg  650 mg Oral Q6H PRN Christena FlakeJohn J Poggi, MD   650 mg at 06/01/15 1225   Or  . acetaminophen (TYLENOL) suppository 650 mg  650 mg Rectal Q6H PRN Christena FlakeJohn J Poggi, MD      . aspirin chewable tablet 81 mg  81 mg Oral Daily Adrian SaranSital Mody, MD   81 mg at 06/01/15 0948  . bisacodyl (DULCOLAX) suppository 10 mg  10 mg Rectal Daily PRN Christena FlakeJohn J Poggi, MD      . calcium acetate (PHOSLO) capsule 1,334 mg  1,334 mg Oral TID WC Lamont DowdySarath Kolluru, MD   1,334 mg at 06/01/15 1216  . carbidopa-levodopa (SINEMET IR) 25-100 MG per tablet immediate release 1 tablet  1 tablet Oral BID Adrian SaranSital Mody, MD   1 tablet at 06/01/15 0949  . carvedilol (COREG) tablet 3.125 mg  3.125 mg Oral BID WC Adrian SaranSital Mody, MD   3.125 mg at 06/01/15 0949  . cinacalcet (SENSIPAR) tablet 30 mg  30 mg Oral Daily Adrian SaranSital Mody, MD   30 mg at 06/01/15 0948  . diphenhydrAMINE (BENADRYL) 12.5 MG/5ML elixir 12.5-25 mg  12.5-25 mg Oral Q4H PRN Christena FlakeJohn J Poggi, MD      . feeding supplement (NEPRO CARB STEADY) liquid  237 mL  237 mL Oral Q24H Adrian Saran, MD   237 mL at 06/01/15 0951  . gemfibrozil (LOPID) tablet 600 mg  600 mg Oral BID Adrian Saran, MD   600 mg at 06/01/15 0949  . heparin injection 5,000 Units  5,000 Units Subcutaneous Q12H Adrian Saran, MD   5,000 Units at 06/01/15 0948  . HYDROmorphone (DILAUDID) injection 1-2 mg  1-2 mg Intravenous Q2H PRN Christena Flake, MD      . metoCLOPramide (REGLAN) tablet 5-10 mg  5-10 mg Oral Q8H PRN Christena Flake, MD       Or  . metoCLOPramide (REGLAN) injection 5-10 mg  5-10 mg Intravenous Q8H PRN Christena Flake, MD      . midodrine (PROAMATINE) tablet 10 mg  10 mg Oral Q dialysis Adrian Saran, MD   10 mg at 05/30/15 1700  . multivitamin (RENA-VIT) tablet   Oral q morning - 10a Christena Flake, MD   1 tablet  at 06/01/15 [phone]  . ondansetron (ZOFRAN) tablet 4 mg  4 mg Oral Q6H PRN Christena Flake, MD       Or  . ondansetron [facility]) injection 4 mg  4 mg Intravenous Q6H PRN Christena Flake, MD      . oxyCODONE (Oxy IR/ROXICODONE) immediate release tablet 5-10 mg  5-10 mg Oral Q4H PRN Christena Flake, MD   5 mg at 06/01/15 1355  . pantoprazole (PROTONIX) EC tablet 40 mg  40 mg Oral Daily Adrian Saran, MD   40 mg at 06/01/15 0950  . pravastatin (PRAVACHOL) tablet 10 mg  10 mg Oral q1800 Sital Mody, MD      . senna-docusate (Senokot-S) tablet 2 tablet  2 tablet Oral BID Sital Mody, MD      . sodium chloride 0.9 % injection 3 mL  3 mL Intravenous Q12H Adrian Saran, MD   3 mL at 06/01/15 0953  . sodium phosphate (FLEET) 7-19 GM/118ML enema 1 enema  1 enema Rectal Once PRN Christena Flake, MD         Discharge Medications: Please see discharge summary for a list of discharge medications.  Relevant Imaging Results:  Relevant Lab Results:  Recent Labs    Additional Information  (Dialysis Patient MWF 6 am Lesia Sago. SSN: [id number])  Haig Prophet, LCSW

## 2015-06-01 NOTE — Progress Notes (Signed)
[facility] - [place] at [facility]   PATIENT NAME: [name]    MR#:  [id number]  DATE OF BIRTH:  1929-05-02  SUBJECTIVE:   No issues overnight Patient doing fairly well. Physical therapy is recommending skilled nursing facility  REVIEW OF SYSTEMS:    Review of Systems  Constitutional: Negative for fever, chills and malaise/fatigue.  HENT: Negative for sore throat.   Eyes: Negative for blurred vision.  Respiratory: Negative for cough, hemoptysis, shortness of breath and wheezing.   Cardiovascular: Negative for chest pain, palpitations and leg swelling.  Gastrointestinal: Negative for nausea, vomiting, abdominal pain, diarrhea and blood in stool.  Genitourinary: Negative for dysuria.  Musculoskeletal: Positive for joint pain. Negative for back pain.  Neurological: Negative for dizziness, tremors and headaches.  Endo/Heme/Allergies: Does not bruise/bleed easily.    Tolerating Diet: Yes      DRUG ALLERGIES:   Allergies  Allergen Reactions  . Penicillins Swelling    VITALS:  Blood pressure 120/84, pulse 72, temperature 98.6 F (37 C), temperature source Oral, resp. rate 18, height  (1.753 m), weight 93.8 kg (206 lb 12.7 oz), SpO2 96 %.  PHYSICAL EXAMINATION:   Physical Exam  Constitutional: He is oriented to person, place, and time and well-developed, well-nourished, and in no distress. No distress.  HENT:  Head: Normocephalic.  Eyes: No scleral icterus.  Neck: Normal range of motion. Neck supple. No JVD present. No tracheal deviation present.  Cardiovascular: Normal rate, regular rhythm and normal heart sounds.  Exam reveals no gallop and no friction rub.   No murmur heard. Pulmonary/Chest: Effort normal and breath sounds normal. No respiratory distress. He has no wheezes. He has no rales. He exhibits no tenderness.  Abdominal: Soft. Bowel sounds are normal. He exhibits no distension and no mass. There is no tenderness. There is no  rebound and no guarding.  Musculoskeletal: Normal range of motion. He exhibits no edema.  Neurological: He is alert and oriented to person, place, and time.  Tremor right hand  Skin: Skin is warm. No rash noted. No erythema.  Psychiatric: Affect and judgment normal.      LABORATORY PANEL:   CBC  Recent Labs Lab 06/01/15 0547  WBC 12.5*  HGB 10.0*  HCT 31.1*  PLT 213   ------------------------------------------------------------------------------------------------------------------  Chemistries   Recent Labs Lab 05/30/15 1012  06/01/15 0547  NA 142  < > 140  K 6.1*  < > 5.0  CL 98*  < > 97*  CO2 29  < > 29  GLUCOSE 93  < > 129*  BUN 45*  < > 25*  CREATININE 8.52*  < > 5.13*  CALCIUM 9.8  < > 9.2  AST 16  --   --   ALT 5*  --   --   ALKPHOS 93  --   --   BILITOT 0.8  --   --   < > = values in this interval not displayed. ------------------------------------------------------------------------------------------------------------------  Cardiac Enzymes  Recent Labs Lab 05/30/15 1012 05/30/15 1232 05/30/15 1750  TROPONINI 0.06* 0.05* 0.07*   ------------------------------------------------------------------------------------------------------------------  RADIOLOGY:  Dg Ankle 2 Views Left  05/31/2015  CLINICAL DATA:  Left ankle fracture.  ORIF. EXAM: LEFT ANKLE - 2 VIEW; DG C-ARM 61-120 MIN COMPARISON:  None. FINDINGS: Two intraoperative films show a intramedullary rod in the distal fibula. Fixation screws are seen across the interosseous ligament as well as in the medial and lateral malleolus. Fracture fragments are in anatomic alignment, and talus  is centered within the ankle mortise. IMPRESSION: Internal fixation of ankle fracture in anatomic alignment. Electronically Signed   By: Myles RosenthalJohn  Stahl M.D.   On: 05/31/2015 19:03   Dg C-arm 1-60 Min  05/31/2015  CLINICAL DATA:  Left ankle fracture.  ORIF. EXAM: LEFT ANKLE - 2 VIEW; DG C-ARM 61-120 MIN COMPARISON:   None. FINDINGS: Two intraoperative films show a intramedullary rod in the distal fibula. Fixation screws are seen across the interosseous ligament as well as in the medial and lateral malleolus. Fracture fragments are in anatomic alignment, and talus is centered within the ankle mortise. IMPRESSION: Internal fixation of ankle fracture in anatomic alignment. Electronically Signed   By: Myles RosenthalJohn  Stahl M.D.   On: 05/31/2015 19:03     ASSESSMENT AND PLAN:    81 year old male with end-stage renal disease on hemodialysis Monday, Wednesday and Friday and coronary disease who presents after mechanical fall and noted to have a trimalleolar fracture of the left ankle.  1. Hyperkalemia: This is likely secondary to his renal disease. Potassium  improved after dialysis  2. Elevated troponin:  There is no indication for acute coronary syndrome. Troponins are flat. This is due to poor renal clearance  Appreciate  cardiology  consultation.   3. Trimalleolar fracture: Postoperative day #1. Patient will need to go to skilled nursing facility. DVT prophylaxis as per orthopedic surgery. Continue when necessary pain medications..  4. End-stage renal disease and hematemesis:  Hemodialysis as per nephrology. Consultation appreciated. 5. History of coronary artery disease status post stenting: Continue aspirin. Continue low-dose beta blocker preop and postoperative period   6. Parkinson's disease: Continue his outpatient medications.  7. Hyperlipidemia: Continue gemfibrozil and statin   8. Pacemaker status: This is interrogated in July of this year.   Management plans discussed with the patient and he is in agreement.  CODE STATUS: FULL  TOTAL TIME TAKING CARE OF THIS PATIENT: 25 minutes.     POSSIBLE D/C 1-2 days, DEPENDING ON CLINICAL CONDITION.   Alaa Eyerman M.D on 06/01/2015 at 11:36 AM  Between 7am to 6pm - Pager - [phone] After 6pm go to [email or web address] - password [email or web address]  [place]  [facility]  Office  [phone] CC: Primary care physician; Marguarite ArbourSPARKS,JEFFREY D, MD  Note: This dictation was prepared with Dragon dictation along with smaller phrase technology. Any transcriptional errors that result from this process are unintentional.

## 2015-06-01 NOTE — Care Management (Signed)
PT pending. Per CSW patient [facility] at discharge. He is a dialysis patient. He lives with his wife. [facility] [place] [facility] [address], [place], [place] [address] [phone] Margorie JohnM, W, F [id number] schedule. Comes in on wheelchair and was able to stand on a scale for weighing. Holidays- brother-in-law takes him to dialysis. He rides [employer] on usual days. RNCM will continue to follow.

## 2015-06-01 NOTE — Clinical Social Work Placement (Signed)
   CLINICAL SOCIAL WORK PLACEMENT  NOTE  Date:  06/01/2015  Patient Details  Name: [name] MRN: [id number] Date of Birth: 12/08/1928  Clinical Social Work is seeking post-discharge placement for this patient at the Skilled  Nursing Facility level of care (*CSW will initial, date and re-position this form in  chart as items are completed):  Yes   Patient/family provided with [facility] Clinical Social Work Department's list of facilities offering this level of care within the geographic area requested by the patient (or if unable, by the patient's family).  Yes   Patient/family informed of their freedom to choose among providers that offer the needed level of care, that participate in Medicare, Medicaid or managed care program needed by the patient, have an available bed and are willing to accept the patient.  Yes   Patient/family informed of [facility]'s ownership interest in [facility] and [facility], as well as of the fact that they are under no obligation to receive care at these facilities.  PASRR submitted to EDS on       PASRR number received on       Existing PASRR number confirmed on 06/01/15     FL2 transmitted to all facilities in geographic area requested by pt/family on 06/01/15     FL2 transmitted to all facilities within larger geographic area on       Patient informed that his/her managed care company has contracts with or will negotiate with certain facilities, including the following:            Patient/family informed of bed offers received.  Patient chooses bed at       Physician recommends and patient chooses bed at      Patient to be transferred to   on  .  Patient to be transferred to facility by       Patient family notified on   of transfer.  Name of family member notified:        PHYSICIAN       Additional Comment:    _______________________________________________ Haig ProphetMorgan, Melaina Howerton G, LCSW 06/01/2015, 2:15 PM

## 2015-06-01 NOTE — Progress Notes (Signed)
Physical Therapy Treatment Patient Details Name: [name] MRN: [id number] DOB: [birth year] Today's Date: 06/01/2015    History of Present Illness Pt is admitted for L ankle ORIF s/p fall and trimalleloar fracture. Pt with history of ESRD, CAD, pacemaker and Parkinson's disease. Pt reports fall was after trying to get out of a shower    PT Comments    Pt is making good progress this date, however is still limited secondary to R LE strength and L LE NWB status. Pt able to perform standing with rw, trying to maintain correct WB status. Needs heavy assist and cues for transfers. Not safe to ambulate to recliner this date. Good endurance noted with there-ex. Pt very motivated to continue therapy.  Follow Up Recommendations  SNF     Equipment Recommendations       Recommendations for Other Services       Precautions / Restrictions Precautions Precautions: Fall Restrictions Weight Bearing Restrictions: Yes LLE Weight Bearing: Non weight bearing    Mobility  Bed Mobility Overal bed mobility: Needs Assistance;+2 for physical assistance Bed Mobility: Supine to Sit     Supine to sit: Mod assist     General bed mobility comments: assist for trunk support required during mobility. Once seated at EOB, pt able to sit with supervision. +2 assist required for all bed mobility.  Transfers Overall transfer level: Needs assistance Equipment used: Rolling walker (2 wheeled) Transfers: Sit to/from Stand Sit to Stand: Mod assist;+2 physical assistance         General transfer comment: sit<>Stand x 3 attempts with rw. Pt requires therapist to block R foot prior to standing. Once standing, pt requires heavy assist to keep hip under him for upright posture. Pt struggles with keeping correct WB status and fequently places foot on ground  Ambulation/Gait             General Gait Details: unable to ambulate this date secondary to balance/strength deficits   Stairs             Wheelchair Mobility    Modified Rankin (Stroke Patients Only)       Balance Overall balance assessment: Needs assistance;History of Falls Sitting-balance support: Feet supported;Bilateral upper extremity supported Sitting balance-Leahy Scale: Fair                              Cognition Arousal/Alertness: Awake/alert Behavior During Therapy: WFL for tasks assessed/performed Overall Cognitive Status: Within Functional Limits for tasks assessed                      Exercises Other Exercises Other Exercises: Pt performs supine ther-ex including B quad sets, hip add squeezes, SLRs, SAQ, and heel slides. Pt performs x 10 reps with cga. Resistance given to R LE. Slightly improved strength noted since AM.    General Comments        Pertinent Vitals/Pain Pain Assessment: 0-10 Pain Score: 5  Pain Location: L LE Pain Descriptors / Indicators: Operative site guarding Pain Intervention(s): Limited activity within patient's tolerance;Patient requesting pain meds-RN notified;RN gave pain meds during session    Home Living Family/patient expects to be discharged to:: Private residence Living Arrangements: Spouse/significant other Available Help at Discharge: Family Type of Home: House Home Access: Ramped entrance   Home Layout: One level Home Equipment: Environmental consultant - 2 wheels;Walker - 4 wheels;Wheelchair - manual      Prior Function Level of Independence: Independent with  assistive device(s)      Comments: Pt is household ambulator and uses WC for further distances   PT Goals (current goals can now be found in the care plan section) Acute Rehab PT Goals Patient Stated Goal: to walk again PT Goal Formulation: With patient Time For Goal Achievement: 06/15/15 Potential to Achieve Goals: Good Additional Goals Additional Goal #1: Pt will be able to perform bed mobility/transfers with rw and min assist in order to improve functional independence Progress towards  PT goals: Progressing toward goals    Frequency  BID    PT Plan Current plan remains appropriate    Co-evaluation             End of Session Equipment Utilized During Treatment: Gait belt Activity Tolerance: Patient tolerated treatment well Patient left: in bed;with bed alarm set     Time: 1346-1415 PT Time Calculation (min) (ACUTE ONLY): 29 min  Charges:  $Therapeutic Exercise: 8-22 mins $Therapeutic Activity: 8-22 mins                    G Codes:      Shandelle Borrelli 06/01/2015, 2:51 PM Elizabeth PalauStephanie Jaclene Bartelt, PT, DPT [phone]

## 2015-06-01 NOTE — Evaluation (Signed)
Physical Therapy Evaluation Patient Details Name: William PumaWilliam E Counce MRN: [id number] DOB: 11/25/1928 Today's Date: 06/01/2015   History of Present Illness  Pt is admitted for L ankle ORIF s/p fall and trimalleloar fracture. Pt with history of ESRD, CAD, pacemaker and Parkinson's disease. Pt reports fall was after trying to get out of a shower  Clinical Impression  Pt is a pleasant 81 year old male who was admitted for L ankle ORIF. Pt limited in bed mobility, only able to sit at EOB this date secondary to increased weakness in R LE. Pt encouraged to continue there-ex on R LE prior to therapist returning in PM in order to progress OOB mobility. Pt demonstrates deficits with strength/balance/mobility. Would benefit from skilled PT to address above deficits and promote optimal return to PLOF.      Follow Up Recommendations SNF    Equipment Recommendations       Recommendations for Other Services       Precautions / Restrictions Precautions Precautions: Fall Restrictions Weight Bearing Restrictions: Yes LLE Weight Bearing: Non weight bearing      Mobility  Bed Mobility Overal bed mobility: Needs Assistance Bed Mobility: Supine to Sit     Supine to sit: Mod assist     General bed mobility comments: bed mobility performed with mod assist +2 for trunk mobility. Once seated at EOB, pt requires constant cga for assistance of seated balance.  Transfers Overall transfer level: Needs assistance               General transfer comment: unable to perform at this time secondary to decreased strength in R LE. Plan to re-attempt in PM session  Ambulation/Gait                Stairs            Wheelchair Mobility    Modified Rankin (Stroke Patients Only)       Balance Overall balance assessment: Needs assistance;History of Falls Sitting-balance support: Feet supported;Bilateral upper extremity supported Sitting balance-Leahy Scale: Fair                                        Pertinent Vitals/Pain Pain Assessment: 0-10 Pain Score: 5  Pain Location: L LE Pain Descriptors / Indicators: Constant;Operative site guarding Pain Intervention(s): Limited activity within patient's tolerance;Ice applied;RN gave pain meds during session    Home Living Family/patient expects to be discharged to:: Private residence Living Arrangements: Spouse/significant other Available Help at Discharge: Family Type of Home: House Home Access: Ramped entrance     Home Layout: One level Home Equipment: Environmental consultantWalker - 2 wheels;Walker - 4 wheels;Wheelchair - manual      Prior Function Level of Independence: Independent with assistive device(s)         Comments: Pt is household ambulator and uses WC for further distances     Hand Dominance        Extremity/Trunk Assessment   Upper Extremity Assessment: Generalized weakness           Lower Extremity Assessment: Generalized weakness (L LE grossly 3/5; R LE grossly 2/5)         Communication   Communication: No difficulties  Cognition Arousal/Alertness: Awake/alert Behavior During Therapy: WFL for tasks assessed/performed Overall Cognitive Status: Within Functional Limits for tasks assessed  General Comments      Exercises Other Exercises Other Exercises: Pt able to perform supine ther-ex including B LE quad sets, SLRs, glut sets, and SAQs. While seated at EOB, pt also able to perform LAQ. 1 x 10 reps performed on L LE and 2 x 10 reps performed on R LE to improve strength.       Assessment/Plan    PT Assessment Patient needs continued PT services  PT Diagnosis Difficulty walking;Generalized weakness;Acute pain   PT Problem List Decreased strength;Decreased mobility;Decreased knowledge of use of DME;Pain  PT Treatment Interventions Gait training;Therapeutic exercise   PT Goals (Current goals can be found in the Care Plan section) Acute Rehab PT  Goals Patient Stated Goal: to walk again PT Goal Formulation: With patient Time For Goal Achievement: 06/15/15 Potential to Achieve Goals: Good    Frequency BID   Barriers to discharge        Co-evaluation               End of Session   Activity Tolerance: Patient tolerated treatment well Patient left: in bed;with bed alarm set Nurse Communication: Mobility status         Time: 1610-9604 PT Time Calculation (min) (ACUTE ONLY): 32 min   Charges:   PT Evaluation $Initial PT Evaluation Tier I: 1 Procedure PT Treatments $Therapeutic Exercise: 8-22 mins   PT G Codes:        Mihira Tozzi 2015/06/10, 11:46 AM  Elizabeth Palau, PT, DPT [phone]

## 2015-06-01 NOTE — Progress Notes (Signed)
  Subjective: 1 Day Post-Op Procedure(s) (LRB): OPEN REDUCTION INTERNAL FIXATION TRIMALLEOLAR IANKLE FRACTURE DISLOCATION (Left) Patient reports pain as mild.   Patient is well but admitted for multiple medical issues. Plan is to go Skilled nursing facility after hospital stay. Negative for chest pain and shortness of breath Fever: no Gastrointestinal:Negative for nausea and vomiting  Objective: Vital signs in last 24 hours: Temp:  [97.6 F (36.4 C)-99.1 F (37.3 C)] 97.6 F (36.4 C) (12/08 0347) Pulse Rate:  [42-220] 220 (12/08 0347) Resp:  [18] 18 (12/08 0347) BP: (81-156)/(38-131) 128/38 mmHg (12/08 0347) SpO2:  [72 %-99 %] 72 % (12/08 0347)  Intake/Output from previous day:  Intake/Output Summary (Last 24 hours) at 06/01/15 0737 Last data filed at 06/01/15 0300  Gross per 24 hour  Intake 632.33 ml  Output      0 ml  Net 632.33 ml    Intake/Output this shift:    Labs:  Recent Labs  05/30/15 1012 05/31/15 0546 06/01/15 0547  HGB 11.6* 10.8* 10.0*    Recent Labs  05/31/15 0546 06/01/15 0547  WBC 13.6* 12.5*  RBC 3.26* 3.15*  HCT 31.9* 31.1*  PLT 229 213    Recent Labs  05/31/15 0546 05/31/15 0950 06/01/15 0547  NA 140  --  140  K 5.0 4.3 5.0  CL 98*  --  97*  CO2 32  --  29  BUN 29*  --  25*  CREATININE 6.36*  --  5.13*  GLUCOSE 99  --  129*  CALCIUM 9.1  --  9.2    Recent Labs  05/30/15 1012  INR 1.14     EXAM General - Patient is Alert, Appropriate and Oriented Extremity - ABD soft Incision: dressing C/D/I Dressing/Incision - clean, dry, no drainage to the right post-op splint. Motor Function - intact, moving foot and toes slightly on exam.  Pt has decreased sensation to each toe, on the dorsal as well as volar aspect of his toes.  Pt has intact sensation superior to the left post-op splint along his medial tib-fib and lateral tib-fib.  Pt states that he has not had sensation in that foot for several months due to his other medical  issues.  Past Medical History  Diagnosis Date  . Renal disorder   . Stroke (HCC)   . Coronary artery disease   . Myocardial infarction (HCC)   . Parkinson disease (HCC)   . Chronic home hemodialysis status (HCC)     Assessment/Plan: 1 Day Post-Op Procedure(s) (LRB): OPEN REDUCTION INTERNAL FIXATION TRIMALLEOLAR IANKLE FRACTURE DISLOCATION (Left) Active Problems:   Trimalleolar fracture  Estimated body mass index is 30.52 kg/(m^2) as calculated from the following:   Height as of this encounter: 5\' 9"  (1.753 m).   Weight as of this encounter: 93.8 kg (206 lb 12.7 oz). Advance diet Up with therapy   Last measured HR was 220, repeating vitals to ensure that this is a mistake in inputing the numbers, pt not tachy with chest auscultation.  DVT Prophylaxis - Foot Pumps and heparin Non weight-Bearing to left leg  J. Horris LatinoLance Brandii Lakey, PA-C [facility] 06/01/2015, 7:37 AM

## 2015-06-01 NOTE — Progress Notes (Signed)
Initial Nutrition Assessment   INTERVENTION:   Meals and Snacks: Cater to patient preferences Medical Food Supplement Therapy: will recommend Nepro Shake po daily, each supplement provides 425 kcal and 19 grams protein   NUTRITION DIAGNOSIS:   Increased nutrient needs related to chronic illness, acute illness as evidenced by estimated needs.  GOAL:   Patient will meet greater than or equal to 90% of their needs  MONITOR:    (Energy Intake, Electrolyte and renal Profile, Anthropometrics, Digestive System)  REASON FOR ASSESSMENT:    (Renal Diet Order)    ASSESSMENT:   Pt admitted s/p fall getting out of bed, POD1 ORIF of left ankle fracture. Pt with h/o ESRD on HD, received HD on day of admission and yesterday for hyperkalemia.  Past Medical History  Diagnosis Date  . Renal disorder   . Stroke (HCC)   . Coronary artery disease   . Myocardial infarction (HCC)   . Parkinson disease (HCC)   . Chronic home hemodialysis status (HCC)      Diet Order:  Diet renal with fluid restriction Fluid restriction:: 1200 mL Fluid; Room service appropriate?: Yes; Fluid consistency:: Thin   Current Nutrition: Pt ate 90% of breakfast this am. Pt reports not having had much except a [place] sandwich tray since admission secondary to NPO for surgery.  Food/Nutrition-Related History: Pt reports good appetite PTA eating 3 meals per day with a protein bar.   Scheduled Medications:  . aspirin  81 mg Oral Daily  . calcium acetate  1,334 mg Oral TID WC  . carbidopa-levodopa  1 tablet Oral BID  . carvedilol  3.125 mg Oral BID WC  . cinacalcet  30 mg Oral Daily  . clindamycin (CLEOCIN) IV  900 mg Intravenous Q6H  . docusate sodium  100 mg Oral BID  . feeding supplement (NEPRO CARB STEADY)  237 mL Oral Q24H  . gemfibrozil  600 mg Oral BID  . heparin subcutaneous  5,000 Units Subcutaneous Q12H  . multivitamin   Oral q morning - 10a  . pantoprazole  40 mg Oral Daily  . pravastatin  10 mg  Oral q1800  . sodium chloride  3 mL Intravenous Q12H    Continuous Medications:  . sodium chloride 10 mL/hr at 05/31/15 2046     Electrolyte/Renal Profile and Glucose Profile:   Recent Labs Lab 05/30/15 1012 05/31/15 0546 05/31/15 0950 06/01/15 0547  NA 142 140  --  140  K 6.1* 5.0 4.3 5.0  CL 98* 98*  --  97*  CO2 29 32  --  29  BUN 45* 29*  --  25*  CREATININE 8.52* 6.36*  --  5.13*  CALCIUM 9.8 9.1  --  9.2  GLUCOSE 93 99  --  129*   Protein Profile:  Recent Labs Lab 05/30/15 1012  ALBUMIN 3.9    Gastrointestinal Profile: Last BM: unknown, active BS   Weight Change: Pt reports usual dry weight of 154? Current weight of 206lbs. No trend per [facility] documentation   Height:   Ht Readings from Last 1 Encounters:  05/30/15 5\' 9"  (1.753 m)    Weight:   Wt Readings from Last 1 Encounters:  05/30/15 206 lb 12.7 oz (93.8 kg)    BMI:  Body mass index is 30.52 kg/(m^2).   EDUCATION NEEDS:   No education needs identified at this time   LOW Care Level  Leda QuailAllyson Kyrsten Deleeuw, [place]RD, LDN Pager [phone]

## 2015-06-01 NOTE — Clinical Social Work Note (Signed)
Clinical Social Work Assessment  Patient Details  Name: [name] MRN: [id number] Date of Birth: 05-Jul-1928  Date of referral:  06/01/15               Reason for consult:  Facility Placement                Permission sought to share information with:  Chartered certified accountant granted to share information::  Yes, Verbal Permission Granted  Name::      [place]::   [place]   Relationship::     Contact Information:     Housing/Transportation Living arrangements for the past 2 months:  [place] of Information:  Patient, Spouse Patient Interpreter Needed:  None Criminal Activity/Legal Involvement Pertinent to Current Situation/Hospitalization:  No - Comment as needed Significant Relationships:  Adult Children, Spouse Lives with:  Spouse Do you feel safe going back to the place where you live?  Yes Need for family participation in patient care:  Yes (Comment)  Care giving concerns:  Patient lives with his wife Becenti in [place].    Social Worker assessment / plan: Holiday representative (CSW) received SNF consult. PT is recommending short term rehab. Patient goes to dialysis MWF at [facility] in [place] at 6 am. CSW met with patient and and his wife was at bedside. Patient was laying in bed and was alert and oriented. CSW introduced self and explained role of CSW department. Patient reported that he lives in [place] with his wife. Per patient his daughter has been a long term care resident at [facility] for 18 years. Per wife their son lives in [place], [place]. [place] explained that PT is recommending SNF. Patient requested [facility].   Per Kim admissions coordinator at [facility] they cannot provide transportation to dialysis. Per wife patient uses [facility] and does not have to pay anything. Per Marrian Salvage has a contract with [facility] and will [employer] and not the patient. Per Maudie Mercury patient can use [facility], which is $55 for  1 way. Patient and wife reported that they can't afford [facility]. Wife and patient are agreeable to extend SNF search in [facility] and inquired about [facility].   FL2 complete and faxed out.    Employment status:  Retired Forensic scientist:  Medicare PT Recommendations:  [place] / Referral to community resources:  [place]  Patient/Family's Response to care: Patient and wife are agreeable to [employer].   Patient/Family's Understanding of and Emotional Response to Diagnosis, Current Treatment, and Prognosis: Patient and wife were pleasant throughout assessment.   Emotional Assessment Appearance:  Appears stated age Attitude/Demeanor/Rapport:    Affect (typically observed):  Accepting, Adaptable, Pleasant Orientation:  Oriented to Self, Oriented to Place, Oriented to  Time, Oriented to Situation Alcohol / Substance use:  Not Applicable Psych involvement (Current and /or in the community):  No (Comment)  Discharge Needs  Concerns to be addressed:  Discharge Planning Concerns Readmission within the last 30 days:  No Current discharge risk:  Dependent with Mobility Barriers to Discharge:  Continued Medical Work up   Loralyn Freshwater, LCSW 06/01/2015, 2:17 PM

## 2015-06-01 NOTE — Care Management Important Message (Signed)
Important Message  Patient Details  Name: William PumaWilliam E Chaves MRN: [id number] Date of Birth: 09/06/1928   Medicare Important Message Given:  Yes    Olegario MessierKathy A Kobie Matkins 06/01/2015, 10:48 AM

## 2015-06-02 ENCOUNTER — Encounter: Payer: Self-pay | Admitting: Surgery

## 2015-06-02 LAB — RENAL FUNCTION PANEL
Albumin: 3.1 g/dL — ABNORMAL LOW (ref 3.5–5.0)
Anion gap: 17 — ABNORMAL HIGH (ref 5–15)
BUN: 54 mg/dL — AB (ref 6–20)
CALCIUM: 9.2 mg/dL (ref 8.9–10.3)
CO2: 26 mmol/L (ref 22–32)
CREATININE: 7.92 mg/dL — AB (ref 0.61–1.24)
Chloride: 95 mmol/L — ABNORMAL LOW (ref 101–111)
GFR, EST AFRICAN AMERICAN: 6 mL/min — AB (ref >60)
GFR, EST NON AFRICAN AMERICAN: 5 mL/min — AB (ref >60)
Glucose, Bld: 112 mg/dL — ABNORMAL HIGH (ref 65–99)
Phosphorus: 6.5 mg/dL — ABNORMAL HIGH (ref 2.5–4.6)
Potassium: 4.5 mmol/L (ref 3.5–5.1)
SODIUM: 138 mmol/L (ref 135–145)

## 2015-06-02 LAB — CBC
HCT: 27.7 % — ABNORMAL LOW (ref 40.0–52.0)
Hemoglobin: 9.1 g/dL — ABNORMAL LOW (ref 13.0–18.0)
MCH: 31.9 pg (ref 26.0–34.0)
MCHC: 32.8 g/dL (ref 32.0–36.0)
MCV: 97.4 fL (ref 80.0–100.0)
PLATELETS: 211 10*3/uL (ref 150–440)
RBC: 2.84 MIL/uL — AB (ref 4.40–5.90)
RDW: 15.1 % — AB (ref 11.5–14.5)
WBC: 9.2 10*3/uL (ref 3.8–10.6)

## 2015-06-02 MED ORDER — CARVEDILOL 3.125 MG PO TABS: 3.1250 mg | ORAL_TABLET | Freq: Two times a day (BID) | ORAL | Status: DC

## 2015-06-02 MED ORDER — CALCIUM ACETATE (PHOS BINDER) 667 MG PO CAPS: 1334.0000 mg | ORAL_CAPSULE | Freq: Three times a day (TID) | ORAL | Status: DC

## 2015-06-02 MED ORDER — EPOETIN ALFA 4000 UNIT/ML IJ SOLN: 4000.0000 [IU] | Freq: Once | INTRAMUSCULAR | Status: DC

## 2015-06-02 MED ORDER — EPOETIN ALFA 10000 UNIT/ML IJ SOLN
10000.0000 [IU] | Freq: Once | INTRAMUSCULAR | Status: AC
  Administered 2015-06-02: 10000 [IU] via INTRAVENOUS

## 2015-06-02 MED ORDER — NEPRO/CARBSTEADY PO LIQD: 237.0000 mL | ORAL | Status: DC

## 2015-06-02 MED ORDER — OXYCODONE HCL 5 MG PO TABS: 5.0000 mg | ORAL_TABLET | ORAL | Status: DC | PRN

## 2015-06-02 NOTE — Progress Notes (Signed)
Physical Therapy Treatment Patient Details Name: [name] MRN: [id number] DOB: March 14, 1929 Today's Date: 06/02/2015    History of Present Illness Pt is admitted for L ankle ORIF s/p fall and trimalleloar fracture. Pt with history of ESRD, CAD, pacemaker and Parkinson's disease. Pt reports fall was after trying to get out of a shower    PT Comments    Pt is making good progress towards goals with increased endurance noted on there-ex. R LE continues to be weak compared to  L LE. Improved ability to maintain L NWB, however still not safe for stand pivot transfers at this time. Would recommend slideboard transfers for OOB mobility. Pt continues to be motivated to participate in therapy.  Follow Up Recommendations  SNF     Equipment Recommendations       Recommendations for Other Services       Precautions / Restrictions Precautions Precautions: Fall Restrictions Weight Bearing Restrictions: Yes LLE Weight Bearing: Non weight bearing    Mobility  Bed Mobility Overal bed mobility: Needs Assistance;+2 for physical assistance Bed Mobility: Supine to Sit     Supine to sit: Mod assist     General bed mobility comments: assist for trunk support required during mobility. Once seated at EOB, pt able to sit with supervision. +2 assist required for all bed mobility.  Transfers Overall transfer level: Needs assistance Equipment used: Rolling walker (2 wheeled) Transfers: Sit to/from Stand Sit to Stand: Mod assist;+2 physical assistance         General transfer comment: sit<>stand attempted x 2 attempts with pt able to stand for approx 10 seconds each attempt. +2 required for attempts as well as therapist blocking R foot. Pt with improved ability to maintain correct WB status on L LE.   Ambulation/Gait             General Gait Details: unable to ambulate this date secondary to balance/strength deficits   Stairs            Wheelchair Mobility    Modified  Rankin (Stroke Patients Only)       Balance                                    Cognition Arousal/Alertness: Awake/alert Behavior During Therapy: WFL for tasks assessed/performed Overall Cognitive Status: Within Functional Limits for tasks assessed                      Exercises Other Exercises Other Exercises: supine ther-ex performed on B LE including quad sets, hip abd/add, SLRs, SAQ and R LE resisted heel slides. All ther-ex performed x 12 reps with cues for correct technique. Min assist required for completion.    General Comments        Pertinent Vitals/Pain Pain Assessment: No/denies pain    Home Living                      Prior Function            PT Goals (current goals can now be found in the care plan section) Acute Rehab PT Goals Patient Stated Goal: to walk again PT Goal Formulation: With patient Time For Goal Achievement: 06/15/15 Potential to Achieve Goals: Good Progress towards PT goals: Progressing toward goals    Frequency  BID    PT Plan Current plan remains appropriate    Co-evaluation  End of Session Equipment Utilized During Treatment: Gait belt Activity Tolerance: Patient tolerated treatment well Patient left: in bed;with bed alarm set     Time: [phone] PT Time Calculation (min) (ACUTE ONLY): 24 min  Charges:  $Therapeutic Exercise: 8-22 mins $Therapeutic Activity: 8-22 mins                    G Codes:      Essam Lowdermilk 06/02/2015, 11:51 AM Elizabeth PalauStephanie Doctor Sheahan, PT, DPT [phone]

## 2015-06-02 NOTE — Discharge Summary (Signed)
[facility] [place] [facility]   PATIENT NAME: William CoreWilliam Blackburn    MR#:  [id number]  DATE OF BIRTH:  02/25/1929  DATE OF ADMISSION:  05/30/2015 ADMITTING PHYSICIAN: Adrian SaranSital Ward Boissonneault, MD  DATE OF DISCHARGE: 06/02/2015    PRIMARY CARE PHYSICIAN: SPARKS,JEFFREY D, MD    ADMISSION DIAGNOSIS:  End stage renal disease (HCC) [N18.6] Hyperkalemia [E87.5] Trimalleolar fracture of ankle, closed, left, initial encounter [S82.852A]  DISCHARGE DIAGNOSIS:  Active Problems:   Trimalleolar fracture   SECONDARY DIAGNOSIS:   Past Medical History  Diagnosis Date  . Renal disorder   . Stroke (HCC)   . Coronary artery disease   . Myocardial infarction (HCC)   . Parkinson disease (HCC)   . Chronic home hemodialysis status [facility](HCC)     HOSPITAL COURSE:   81 year old male with end-stage renal disease on hemodialysis Monday, Wednesday and Friday and coronary disease who presents after mechanical fall and noted to have a trimalleolar fracture of the left ankle.  1. Hyperkalemia: This is likely secondary to his renal disease. Potassium improved after dialysis  2. Elevated troponin: There is no indication for acute coronary syndrome. Troponins are flat. This is due to poor renal clearance Appreciate cardiology consultation.   3. Trimalleolar fracture: Postoperative day #2. Patient will require skilled nursing facility. USE ASA 325 mg daily for DVT prophylaxis as per orthopedic surgery. Cont PRN pain meds  4. End-stage renal disease and hematemesis: Hemodialysis on M, W, F.Patient has HD while he was in hospital.  5. History of coronary artery disease status post stenting: Continue aspirin. Continue low-dose beta blocker preop and postoperative period   6. Parkinson's disease: Continue his outpatient medications.  7. Hyperlipidemia: Continue gemfibrozil and statin  8. Pacemaker status: This is interrogated in July of this year  9. Sleep apnea: Patient wears CPAP at  night.   DISCHARGE CONDITIONS AND DIET:   RENAL  Stable condition   CONSULTS OBTAINED:  Treatment Team:  Alwyn Peawayne D Callwood, MD Christena FlakeJohn J Poggi, MD Lamont DowdySarath Kolluru, MD  DRUG ALLERGIES:   Allergies  Allergen Reactions  . Penicillins Swelling    DISCHARGE MEDICATIONS:   Current Discharge Medication List    START taking these medications   Details  calcium acetate (PHOSLO) 667 MG capsule Take 2 capsules (1,334 mg total) by mouth 3 (three) times daily with meals. Qty: 90 capsule, Refills: 0    carvedilol (COREG) 3.125 MG tablet Take 1 tablet (3.125 mg total) by mouth 2 (two) times daily with a meal. Qty: 60 tablet, Refills: 0    Nutritional Supplements (FEEDING SUPPLEMENT, NEPRO CARB STEADY,) LIQD Take 237 mLs by mouth daily. Qty: 237 mL, Refills: 0    oxyCODONE (OXY IR/ROXICODONE) 5 MG immediate release tablet Take 1-2 tablets (5-10 mg total) by mouth every 4 (four) hours as needed for moderate pain. Qty: 30 tablet, Refills: 0      CONTINUE these medications which have NOT CHANGED   Details  aspirin EC 325 MG tablet Take 325 mg by mouth daily.    carbidopa-levodopa (SINEMET IR) 25-100 MG tablet Take 1 tablet by mouth 2 (two) times daily. Refills: 2    gemfibrozil (LOPID) 600 MG tablet Take 1 tablet by mouth 2 (two) times daily. Refills: 2    lovastatin (MEVACOR) 20 MG tablet Take 20 mg by mouth every evening.    midodrine (PROAMATINE) 10 MG tablet Take 1 tablet by mouth every dialysis. Refills: 3    Multiple Vitamins-Minerals (RENAPLEX-D PO) Take 1 tablet  by mouth every morning.    omeprazole (PRILOSEC) 40 MG capsule Take 1 capsule by mouth daily. Refills: 0    SENSIPAR 30 MG tablet Take 1 tablet by mouth daily. Refills: 10              Today   CHIEF COMPLAINT:  Doing well worried about transport to [facility] and is planned for dialysis today.   VITAL SIGNS:  Blood pressure 103/44, pulse 63, temperature 98.4 F (36.9 C), temperature source Oral,  resp. rate 18, height  (1.753 m), weight 93.8 kg (206 lb 12.7 oz), SpO2 94 %.   REVIEW OF SYSTEMS:  Review of Systems  Constitutional: Negative for fever, chills and malaise/fatigue.  HENT: Negative for sore throat.   Eyes: Negative for blurred vision.  Respiratory: Negative for cough, hemoptysis, shortness of breath and wheezing.   Cardiovascular: Negative for chest pain, palpitations and leg swelling.  Gastrointestinal: Negative for nausea, vomiting, abdominal pain, diarrhea and blood in stool.  Genitourinary: Negative for dysuria.  Musculoskeletal: Negative for back pain.  Neurological: Negative for dizziness, tremors and headaches.  Endo/Heme/Allergies: Does not bruise/bleed easily.     PHYSICAL EXAMINATION:  GENERAL:  81 y.o.-year-old patient lying in the bed with no acute distress.  NECK:  Supple, no jugular venous distention. No thyroid enlargement, no tenderness.  LUNGS: Normal breath sounds bilaterally, no wheezing, rales,rhonchi  No use of accessory muscles of respiration.  CARDIOVASCULAR: S1, S2 normal. No murmurs, rubs, or gallops.  ABDOMEN: Soft, non-tender, non-distended. Bowel sounds present. No organomegaly or mass.  EXTREMITIES: No pedal edema, cyanosis, or clubbing.  PSYCHIATRIC: The patient is alert and oriented x 3.  SKIN: No obvious rash, lesion, or ulcer.  TREMOR RIGHT HAND DATA REVIEW:   CBC  Recent Labs Lab 06/01/15 0547  WBC 12.5*  HGB 10.0*  HCT 31.1*  PLT 213    Chemistries   Recent Labs Lab 05/30/15 1012  06/01/15 0547  NA 142  < > 140  K 6.1*  < > 5.0  CL 98*  < > 97*  CO2 29  < > 29  GLUCOSE 93  < > 129*  BUN 45*  < > 25*  CREATININE 8.52*  < > 5.13*  CALCIUM 9.8  < > 9.2  AST 16  --   --   ALT 5*  --   --   ALKPHOS 93  --   --   BILITOT 0.8  --   --   < > = values in this interval not displayed.  Cardiac Enzymes  Recent Labs Lab 05/30/15 1012 05/30/15 1232 05/30/15 1750  TROPONINI 0.06* 0.05* 0.07*     Microbiology Results  @  RADIOLOGY:  Dg Ankle 2 Views Left  05/31/2015  CLINICAL DATA:  Left ankle fracture.  ORIF. EXAM: LEFT ANKLE - 2 VIEW; DG C-ARM 61-120 MIN COMPARISON:  None. FINDINGS: Two intraoperative films show a intramedullary rod in the distal fibula. Fixation screws are seen across the interosseous ligament as well as in the medial and lateral malleolus. Fracture fragments are in anatomic alignment, and talus is centered within the ankle mortise. IMPRESSION: Internal fixation of ankle fracture in anatomic alignment. Electronically Signed   By: Myles Rosenthal M.D.   On: 05/31/2015 19:03   Dg C-arm 1-60 Min  05/31/2015  CLINICAL DATA:  Left ankle fracture.  ORIF. EXAM: LEFT ANKLE - 2 VIEW; DG C-ARM 61-120 MIN COMPARISON:  None. FINDINGS: Two intraoperative films show a intramedullary rod in the distal  fibula. Fixation screws are seen across the interosseous ligament as well as in the medial and lateral malleolus. Fracture fragments are in anatomic alignment, and talus is centered within the ankle mortise. IMPRESSION: Internal fixation of ankle fracture in anatomic alignment. Electronically Signed   By: Myles Rosenthal M.D.   On: 05/31/2015 19:03      Management plans discussed with the patient and he is in agreement. Stable for discharge SNF  Patient should follow up with [facility]THO 10 days  CODE STATUS:     Code Status Orders        Start     Ordered   05/31/15 2024  Full code   Continuous     05/31/15 2023      TOTAL TIME TAKING CARE OF THIS PATIENT: 35 minutes.    Note: This dictation was prepared with Dragon dictation along with smaller phrase technology. Any transcriptional errors that result from this process are unintentional.  Deserea Bordley M.D on 06/02/2015 at 7:47 AM  Between 7am to 6pm - Pager - [phone] After 6pm go to [email or web address] - password [email or web address]  [facility]  Office  [phone]  CC: Primary care physician;  Marguarite Arbour, MD

## 2015-06-02 NOTE — Discharge Instructions (Signed)
NSTRUCTIONS AFTER Surgery  o Remove items at home which could result in a fall. This includes throw rugs or furniture in walking pathways o ICE to the affected joint every three hours while awake for 30 minutes at a time, for at least the first 3-5 days, and then as needed for pain and swelling.  Continue to use ice for pain and swelling. You may notice swelling that will progress down to the foot and ankle.  This is normal after surgery.  Elevate your leg when you are not up walking on it.   o Continue to use the breathing machine you got in the hospital (incentive spirometer) which will help keep your temperature down.  It is common for your temperature to cycle up and down following surgery, especially at night when you are not up moving around and exerting yourself.  The breathing machine keeps your lungs expanded and your temperature down.   DIET:  As you were doing prior to hospitalization, we recommend a well-balanced diet.  DRESSING / WOUND CARE / SHOWERING  Remain in the left post-op splint until your first follow-up appointment.  ACTIVITY  o Increase activity slowly as tolerated, but follow the weight bearing instructions below.   o No driving for 6 weeks or until further direction given by your physician.  You cannot drive while taking narcotics.  o No lifting or carrying greater than 10 lbs. until further directed by your surgeon. o Avoid periods of inactivity such as sitting longer than an hour when not asleep. This helps prevent blood clots.  o You may return to work once you are authorized by your doctor.   WEIGHT BEARING  Non-weightbearing to the left lower extremity  EXERCISES As per PT instructions  CONSTIPATION  Constipation is defined medically as fewer than three stools per week and severe constipation as less than one stool per week.  Even if you have a regular bowel pattern at home, your normal regimen is likely to be disrupted due to multiple reasons following  surgery.  Combination of anesthesia, postoperative narcotics, change in appetite and fluid intake all can affect your bowels.   YOU MUST use at least one of the following options; they are listed in order of increasing strength to get the job done.  They are all available over the counter, and you may need to use some, POSSIBLY even all of these options:    Drink plenty of fluids (prune juice may be helpful) and high fiber foods Colace 100 mg by mouth twice a day  Senokot for constipation as directed and as needed Dulcolax (bisacodyl), take with full glass of water  Miralax (polyethylene glycol) once or twice a day as needed.  If you have tried all these things and are unable to have a bowel movement in the first 3-4 days after surgery call either your surgeon or your primary doctor.    If you experience loose stools or diarrhea, hold the medications until you stool forms back up.  If your symptoms do not get better within 1 week or if they get worse, check with your doctor.  If you experience "the worst abdominal pain ever" or develop nausea or vomiting, please contact the office immediately for further recommendations for treatment.  ITCHING:  If you experience itching with your medications, try taking only a single pain pill, or even half a pain pill at a time.  You can also use Benadryl over the counter for itching or also to help with  sleep.   MEDICATIONS:  See your medication summary on the After Visit Summary that nursing will review with you.  You may have some home medications which will be placed on hold until you complete the course of blood thinner medication.  It is important for you to complete the blood thinner medication as prescribed.  PRECAUTIONS:  If you experience chest pain or shortness of breath - call 911 immediately for transfer to the hospital emergency department.   If you develop a fever greater that 101 F, purulent drainage from wound, increased redness or drainage  from wound, foul odor from the wound/dressing, or calf pain - CONTACT YOUR SURGEON.                                                   FOLLOW-UP APPOINTMENTS:  If you do not already have a post-op appointment, please call the office for an appointment to be seen by your surgeon.  Guidelines for how soon to be seen are listed in your After Visit Summary, but are typically between 1-4 weeks after surgery.  MAKE SURE YOU:   Understand these instructions.   Get help right away if you are not doing well or get worse.   Thank you for letting us be a part of your medical care team.  It is a privilege we respect greatly.  We hope these instructions will help you stay on track for a fast and full recovery!

## 2015-06-02 NOTE — Progress Notes (Signed)
Pre-hd tx 

## 2015-06-02 NOTE — Progress Notes (Signed)
  Subjective: 2 Days Post-Op Procedure(s) (LRB): OPEN REDUCTION INTERNAL FIXATION TRIMALLEOLAR IANKLE FRACTURE DISLOCATION (Left) Patient reports pain as mild, pt has a history of neuropathy and his sensation is greatly decreased in his bilateral lower extremities..   Patient is well, and has had no acute complaints or problems Plan is to go Skilled nursing facility after hospital stay. Negative for chest pain and shortness of breath Fever: no Gastrointestinal:Negative for nausea and vomiting  Objective: Vital signs in last 24 hours: Temp:  [98.1 F (36.7 C)-98.6 F (37 C)] 98.4 F (36.9 C) (12/09 0734) Pulse Rate:  [63-165] 63 (12/09 0734) Resp:  [18] 18 (12/09 0734) BP: (79-172)/(43-143) 103/44 mmHg (12/09 0734) SpO2:  [90 %-96 %] 94 % (12/09 0734)  Intake/Output from previous day:  Intake/Output Summary (Last 24 hours) at 06/02/15 0811 Last data filed at 06/01/15 1900  Gross per 24 hour  Intake    660 ml  Output      0 ml  Net    660 ml    Intake/Output this shift:    Labs:  Recent Labs  05/30/15 1012 05/31/15 0546 06/01/15 0547  HGB 11.6* 10.8* 10.0*    Recent Labs  05/31/15 0546 06/01/15 0547  WBC 13.6* 12.5*  RBC 3.26* 3.15*  HCT 31.9* 31.1*  PLT 229 213    Recent Labs  05/31/15 0546 05/31/15 0950 06/01/15 0547  NA 140  --  140  K 5.0 4.3 5.0  CL 98*  --  97*  CO2 32  --  29  BUN 29*  --  25*  CREATININE 6.36*  --  5.13*  GLUCOSE 99  --  129*  CALCIUM 9.1  --  9.2    Recent Labs  05/30/15 1012  INR 1.14     EXAM General - Patient is Alert, Appropriate and Oriented Extremity - ABD soft Dorsiflexion/Plantar flexion intact Incision: dressing C/D/I Negative Homan's sign to the right lower extremity Dressing/Incision - clean, dry, no drainage to the left post-op splint. Motor Function - intact, moving foot and toes well on exam.   Abdomen soft with normal BS.   Sensation decreased to bilateral lower extremities due to history of  neuropathy.  No change in numbness. Pt able to flex and extend toes.  Past Medical History  Diagnosis Date  . Renal disorder   . Stroke (HCC)   . Coronary artery disease   . Myocardial infarction (HCC)   . Parkinson disease (HCC)   . Chronic home hemodialysis status (HCC)     Assessment/Plan: 2 Days Post-Op Procedure(s) (LRB): OPEN REDUCTION INTERNAL FIXATION TRIMALLEOLAR IANKLE FRACTURE DISLOCATION (Left) Active Problems:   Trimalleolar fracture  Estimated body mass index is 30.52 kg/(m^2) as calculated from the following:   Height as of this encounter: 5\' 9"  (1.753 m).   Weight as of this encounter: 93.8 kg (206 lb 12.7 oz). Advance diet Up with therapy Discharge to SNF   Pt will take ASA 325 daily for DVT prophylaxis. Pt to remain non-weightbearing to left lower extremity. Pt will [facility] for repeat x-rays and placement into a short leg cast.  DVT Prophylaxis - Aspirin, Foot Pumps and heparin Non weight-Bearing to left leg  J. Horris LatinoLance McGhee, PA-C [facility] 06/02/2015, 8:11 AM

## 2015-06-02 NOTE — Progress Notes (Signed)
Per Selena BattenKim admissions coordinator [facility] patient's son has worked it out with [facility] and patient will pay for transport to dialysis. [facility] can make a bed offer.   Patient is medically stable for [facility] dialysis. Per Kim [facility] is going to room 201[facility]B. RN will call report at [phone] and arrange EMS for transport. Clinical Child psychotherapistocial Worker (CSW) sent D/C Summary, D/C packet and FL2 to [employer] via [employer]. CSW contacted patient's wife [name] and made her aware of above. Kim Dialysis coordinator is aware of above. Please reconsult if future social work needs arise. CSW signing off.   Jetta LoutBailey Morgan, LCSWA [phone]

## 2015-06-02 NOTE — Progress Notes (Signed)
HD TX Termination 

## 2015-06-02 NOTE — Progress Notes (Signed)
HD tx start 

## 2015-06-02 NOTE — Progress Notes (Signed)
Post HD Tx Assessment  

## 2015-06-02 NOTE — Progress Notes (Signed)
[facility]

## 2015-06-02 NOTE — Progress Notes (Signed)
Post HD Tx Note   

## 2015-06-02 NOTE — NC FL2 (Signed)
[place] MEDICAID FL2 LEVEL OF CARE SCREENING TOOL     IDENTIFICATION  Patient Name: [name] Birthdate: 03/30/1929 Sex: male Admission Date (Current Location): 05/30/2015  [place] and [place] Number:     Facility and Address:  [facility], [address] [place], [place] [address]      Provider Number: [id number]  Attending Physician Name and Address:  Adrian Saran, MD  Relative Name and Phone Number:       Current Level of Care: Hospital Recommended Level of Care: Skilled Nursing Facility Prior Approval Number:    Date Approved/Denied:   PASRR Number:  ( [id number] )  Discharge Plan: SNF    Current Diagnoses: Patient Active Problem List   Diagnosis Date Noted  . Trimalleolar fracture 05/30/2015    Orientation RESPIRATION BLADDER Height & Weight    Self, Time, Situation, Place  Normal Continent  (175.3 cm) 206 lbs.  BEHAVIORAL SYMPTOMS/MOOD NEUROLOGICAL BOWEL NUTRITION STATUS   (none )  (none ) Continent Diet (Renal Diet Fluid Restrictions )  AMBULATORY STATUS COMMUNICATION OF NEEDS Skin   Assist extensive Verbally Surgical wounds (Incision: Left Leg. )                       Personal Care Assistance Level of Assistance  Bathing, Feeding, Dressing Bathing Assistance: Limited assistance Feeding assistance: Independent Dressing Assistance: Limited assistance     Functional Limitations Info  Sight, Hearing, Speech Sight Info: Adequate Hearing Info: Adequate Speech Info: Adequate    SPECIAL CARE FACTORS FREQUENCY  PT (By licensed PT), OT (By licensed OT)     PT Frequency:  (5) OT Frequency:  (5)            Contractures      Additional Factors Info  Code Status, Allergies Code Status Info:  (Full Code. ) Allergies Info:  (Penicillins)           Current Medications (06/02/2015):  This is the current hospital active medication list Current Facility-Administered Medications  Medication Dose Route  Frequency Provider Last Rate Last Dose  . 0.9 %  sodium chloride infusion   Intravenous Continuous Christena Flake, MD   Stopped at 06/01/15 1217  . acetaminophen (TYLENOL) tablet 650 mg  650 mg Oral Q6H PRN Christena Flake, MD   650 mg at 06/01/15 1225   Or  . acetaminophen (TYLENOL) suppository 650 mg  650 mg Rectal Q6H PRN Christena Flake, MD      . aspirin chewable tablet 81 mg  81 mg Oral Daily Adrian Saran, MD   81 mg at 06/01/15 0948  . bisacodyl (DULCOLAX) suppository 10 mg  10 mg Rectal Daily PRN Christena Flake, MD      . calcium acetate (PHOSLO) capsule 1,334 mg  1,334 mg Oral TID WC Lamont Dowdy, MD   1,334 mg at 06/01/15 1646  . carbidopa-levodopa (SINEMET IR) 25-100 MG per tablet immediate release 1 tablet  1 tablet Oral BID Adrian Saran, MD   1 tablet at 06/01/15 2234  . carvedilol (COREG) tablet 3.125 mg  3.125 mg Oral BID WC Adrian Saran, MD   3.125 mg at 06/01/15 1647  . cinacalcet (SENSIPAR) tablet 30 mg  30 mg Oral Daily Adrian Saran, MD   30 mg at 06/01/15 0948  . diphenhydrAMINE (BENADRYL) 12.5 MG/5ML elixir 12.5-25 mg  12.5-25 mg Oral Q4H PRN Christena Flake, MD      . feeding supplement (NEPRO CARB STEADY)  liquid 237 mL  237 mL Oral Q24H Adrian SaranSital Mody, MD   237 mL at 06/01/15 0951  . gemfibrozil (LOPID) tablet 600 mg  600 mg Oral BID Adrian SaranSital Mody, MD   600 mg at 06/01/15 2233  . heparin injection 5,000 Units  5,000 Units Subcutaneous Q12H Adrian SaranSital Mody, MD   5,000 Units at 06/01/15 2233  . HYDROmorphone (DILAUDID) injection 1-2 mg  1-2 mg Intravenous Q2H PRN Christena FlakeJohn J Poggi, MD      . metoCLOPramide (REGLAN) tablet 5-10 mg  5-10 mg Oral Q8H PRN Christena FlakeJohn J Poggi, MD       Or  . metoCLOPramide (REGLAN) injection 5-10 mg  5-10 mg Intravenous Q8H PRN Christena FlakeJohn J Poggi, MD      . midodrine (PROAMATINE) tablet 10 mg  10 mg Oral Q dialysis Adrian SaranSital Mody, MD   10 mg at 05/30/15 1700  . multivitamin (RENA-VIT) tablet   Oral q morning - 10a Christena FlakeJohn J Poggi, MD   1 tablet at 06/01/15 [phone]  . ondansetron (ZOFRAN) tablet 4 mg  4  mg Oral Q6H PRN Christena FlakeJohn J Poggi, MD       Or  . ondansetron [facility](ZOFRAN) injection 4 mg  4 mg Intravenous Q6H PRN Christena FlakeJohn J Poggi, MD      . oxyCODONE (Oxy IR/ROXICODONE) immediate release tablet 5-10 mg  5-10 mg Oral Q4H PRN Christena FlakeJohn J Poggi, MD   5 mg at 06/01/15 1355  . pantoprazole (PROTONIX) EC tablet 40 mg  40 mg Oral Daily Adrian SaranSital Mody, MD   40 mg at 06/01/15 0950  . pravastatin (PRAVACHOL) tablet 10 mg  10 mg Oral q1800 Adrian SaranSital Mody, MD   10 mg at 06/01/15 2233  . senna-docusate (Senokot-S) tablet 2 tablet  2 tablet Oral BID Adrian SaranSital Mody, MD   2 tablet at 06/01/15 2233  . sodium chloride 0.9 % injection 3 mL  3 mL Intravenous Q12H Adrian SaranSital Mody, MD   3 mL at 06/01/15 0953  . sodium phosphate (FLEET) 7-19 GM/118ML enema 1 enema  1 enema Rectal Once PRN Christena FlakeJohn J Poggi, MD         Discharge Medications: Please see discharge summary for a list of discharge medications.  Relevant Imaging Results:  Relevant Lab Results:   Additional Information  (D[facility] Patient MWF [facility] SSN: [id number])  Haig ProphetMorgan, Alverda Nazzaro G, LCSW

## 2015-06-02 NOTE — Clinical Social Work Placement (Signed)
   CLINICAL SOCIAL WORK PLACEMENT  NOTE  Date:  06/02/2015  Patient Details  Name: [name] MRN: [id number] Date of Birth: 12/11/1928  Clinical Social Work is seeking post-discharge placement for this patient at the Skilled  Nursing Facility level of care (*CSW will initial, date and re-position this form in  chart as items are completed):  Yes   Patient/family provided with [facility] Clinical Social Work Department's list of facilities offering this level of care within the geographic area requested by the patient (or if unable, by the patient's family).  Yes   Patient/family informed of their freedom to choose among providers that offer the needed level of care, that participate in Medicare, Medicaid or managed care program needed by the patient, have an available bed and are willing to accept the patient.  Yes   Patient/family informed of [facility]'s ownership interest in [facility] and [facility], as well as of the fact that they are under no obligation to receive care at these facilities.  PASRR submitted to EDS on       PASRR number received on       Existing PASRR number confirmed on 06/01/15     FL2 transmitted to all facilities in geographic area requested by pt/family on 06/01/15     FL2 transmitted to all facilities within larger geographic area on       Patient informed that his/her managed care company has contracts with or will negotiate with certain facilities, including the following:        Yes   Patient/family informed of bed offers received.  Patient chooses bed at  [facility] )     Physician recommends and patient chooses bed at      Patient to be transferred to  [facility] ) on 06/02/15.  Patient to be transferred to facility by  [facility] )     Patient family notified on 06/02/15 of transfer.  Name of family member notified:   (Wife [name] is aware of D/C today. )     PHYSICIAN       Additional Comment:     _______________________________________________ Haig ProphetMorgan, Gowri Suchan G, LCSW 06/02/2015, 10:59 AM

## 2015-06-02 NOTE — Progress Notes (Signed)
[facility]  ROUNDING NOTE   Subjective:   Seen and examined on hemodialysis. UF goal of 1.5.   Objective:  Vital signs in last 24 hours:  Temp:  [98 F (36.7 C)-98.4 F (36.9 C)] 98 F (36.7 C) (12/09 0945) Pulse Rate:  [62-165] 63 (12/09 1030) Resp:  [14-26] 14 (12/09 1030) BP: (79-172)/(43-143) 105/51 mmHg (12/09 1030) SpO2:  [90 %-98 %] 97 % (12/09 1030) Weight:  [101 kg (222 lb 10.6 oz)] 101 kg (222 lb 10.6 oz) (12/09 0945)  Weight change:  Filed Weights   05/30/15 1635 05/30/15 1930 06/02/15 0945  Weight: 93.895 kg (207 lb) 93.8 kg (206 lb 12.7 oz) 101 kg (222 lb 10.6 oz)    Intake/Output: I/O last 3 completed shifts: In: 1292.3 [P.O.:780; I.V.:412.3; IV Piggyback:100] Out: -    Intake/Output this shift:     Physical Exam: General: NAD, laying in bed  Head: Normocephalic, atraumatic. Moist oral mucosal membranes  Eyes: Anicteric, PERRL  Neck: Supple, trachea midline  Lungs:  Clear to auscultation  Heart: Regular rate and rhythm  Abdomen:  Soft, nontender,   Extremities:  no peripheral edema, left ankle in caste  Neurologic: Resting tremor  Skin: No lesions  Access: Left arm AV    Basic Metabolic Panel:  Recent Labs Lab 05/30/15 1012 05/31/15 0546 05/31/15 0950 06/01/15 0547  NA 142 140  --  140  K 6.1* 5.0 4.3 5.0  CL 98* 98*  --  97*  CO2 29 32  --  29  GLUCOSE 93 99  --  129*  BUN 45* 29*  --  25*  CREATININE 8.52* 6.36*  --  5.13*  CALCIUM 9.8 9.1  --  9.2    Liver Function Tests:  Recent Labs Lab 05/30/15 1012  AST 16  ALT 5*  ALKPHOS 93  BILITOT 0.8  PROT 8.0  ALBUMIN 3.9   No results for input(s): LIPASE, AMYLASE in the last 168 hours. No results for input(s): AMMONIA in the last 168 hours.  CBC:  Recent Labs Lab 05/30/15 1012 05/31/15 0546 06/01/15 0547 06/02/15 1030  WBC 15.6* 13.6* 12.5* 9.2  NEUTROABS 13.5*  --   --   --   HGB 11.6* 10.8* 10.0* 9.1*  HCT 36.2* 31.9* 31.1* 27.7*  MCV 98.4 98.0  98.8 97.4  PLT 265 229 213 211    Cardiac Enzymes:  Recent Labs Lab 05/30/15 1012 05/30/15 1232 05/30/15 1750  TROPONINI 0.06* 0.05* 0.07*    BNP: Invalid input(s): POCBNP  CBG: No results for input(s): GLUCAP in the last 168 hours.  Microbiology: No results found for this or any previous visit.  Coagulation Studies: No results for input(s): LABPROT, INR in the last 72 hours.  Urinalysis: No results for input(s): COLORURINE, LABSPEC, PHURINE, GLUCOSEU, HGBUR, BILIRUBINUR, KETONESUR, PROTEINUR, UROBILINOGEN, NITRITE, LEUKOCYTESUR in the last 72 hours.  Invalid input(s): APPERANCEUR    Imaging: Dg Ankle 2 Views Left  05/31/2015  CLINICAL DATA:  Left ankle fracture.  ORIF. EXAM: LEFT ANKLE - 2 VIEW; DG C-ARM 61-120 MIN COMPARISON:  None. FINDINGS: Two intraoperative films show a intramedullary rod in the distal fibula. Fixation screws are seen across the interosseous ligament as well as in the medial and lateral malleolus. Fracture fragments are in anatomic alignment, and talus is centered within the ankle mortise. IMPRESSION: Internal fixation of ankle fracture in anatomic alignment. Electronically Signed   By: Myles RosenthalJohn  Stahl M.D.   On: 05/31/2015 19:03   Dg C-arm 1-60 Min  05/31/2015  CLINICAL DATA:  Left ankle fracture.  ORIF. EXAM: LEFT ANKLE - 2 VIEW; DG C-ARM 61-120 MIN COMPARISON:  None. FINDINGS: Two intraoperative films show a intramedullary rod in the distal fibula. Fixation screws are seen across the interosseous ligament as well as in the medial and lateral malleolus. Fracture fragments are in anatomic alignment, and talus is centered within the ankle mortise. IMPRESSION: Internal fixation of ankle fracture in anatomic alignment. Electronically Signed   By: Myles Rosenthal M.D.   On: 05/31/2015 19:03     Medications:   . sodium chloride Stopped (06/01/15 1217)   . aspirin  81 mg Oral Daily  . calcium acetate  1,334 mg Oral TID WC  . carbidopa-levodopa  1 tablet Oral  BID  . carvedilol  3.125 mg Oral BID WC  . cinacalcet  30 mg Oral Daily  . epoetin (EPOGEN/PROCRIT) injection  4,000 Units Intravenous Once  . feeding supplement (NEPRO CARB STEADY)  237 mL Oral Q24H  . gemfibrozil  600 mg Oral BID  . heparin subcutaneous  5,000 Units Subcutaneous Q12H  . multivitamin   Oral q morning - 10a  . pantoprazole  40 mg Oral Daily  . pravastatin  10 mg Oral q1800  . senna-docusate  2 tablet Oral BID  . sodium chloride  3 mL Intravenous Q12H   acetaminophen **OR** acetaminophen, bisacodyl, diphenhydrAMINE, HYDROmorphone (DILAUDID) injection, metoCLOPramide **OR** metoCLOPramide (REGLAN) injection, midodrine, ondansetron **OR** ondansetron (ZOFRAN) IV, oxyCODONE, sodium phosphate  Assessment/ Plan:  Mr. KADEEM HYLE is a 81 y.o. white male with Parkinson's, coronary artery disease, hyperlipidemia, hypertension, GERD  [facility] MWF [facility]  1. End Stage Renal Disease: MWF. Seen and ecamined on day. Tolerating treatment well. .  Continue MWF schedule  2. Hypertension: well controlled.  - carvedilol  3. Secondary Hyperparathyroidism: PTH 489, phos 5.1 and Ca 9.3 as outpatient.  - calcium acetate 2 tabs with meals.   4. Anemia of chronic kidney disease: hemoglobin 9.1  - epo with treatment   5. Left ankle fracture: ORIF for left ankle. 12/7 Dr. Joice Lofts   LOS: 3 Anne-Marie Genson 12/9/201610:58 AM

## 2015-06-02 NOTE — Progress Notes (Signed)
Pt discharged via ems. Family has belongings

## 2015-06-06 ENCOUNTER — Encounter: Payer: Self-pay | Admitting: Surgery

## 2015-06-08 NOTE — Anesthesia Postprocedure Evaluation (Signed)
Anesthesia Post Note  Patient: [name]  Procedure(s) Performed: Procedure(s) (LRB): OPEN REDUCTION INTERNAL FIXATION TRIMALLEOLAR IANKLE FRACTURE DISLOCATION (Left)  Patient location during evaluation: PACU Anesthesia Type: General Level of consciousness: awake, awake and alert and oriented Pain management: pain level controlled Vital Signs Assessment: post-procedure vital signs reviewed and stable Respiratory status: spontaneous breathing Cardiovascular status: blood pressure returned to baseline Postop Assessment: no headache Anesthetic complications: no    Last Vitals:  Filed Vitals:   06/02/15 1512 06/02/15 1549  BP: 92/40 92/60  Pulse: 65   Temp: 36.6 C   Resp: 18     Last Pain:  Filed Vitals:   06/02/15 1549  PainSc: 0-No pain                 Joette Schmoker

## 2015-06-26 ENCOUNTER — Encounter
Admission: RE | Admit: 2015-06-26 | Discharge: 2015-06-26 | Disposition: A | Discharge status: Home / Self Care | Payer: Medicare Other | Source: Ambulatory Visit | Attending: Internal Medicine | Admitting: Internal Medicine

## 2015-06-26 DIAGNOSIS — R410 Disorientation, unspecified: Secondary | ICD-10-CM | POA: Insufficient documentation

## 2015-06-26 DIAGNOSIS — N189 Chronic kidney disease, unspecified: Secondary | ICD-10-CM | POA: Insufficient documentation

## 2015-07-06 LAB — COMPREHENSIVE METABOLIC PANEL
ALT: 5 U/L — ABNORMAL LOW (ref 17–63)
ANION GAP: 12 (ref 5–15)
AST: 13 U/L — ABNORMAL LOW (ref 15–41)
Albumin: 3.1 g/dL — ABNORMAL LOW (ref 3.5–5.0)
Alkaline Phosphatase: 66 U/L (ref 38–126)
BILIRUBIN TOTAL: 0.7 mg/dL (ref 0.3–1.2)
BUN: 42 mg/dL — ABNORMAL HIGH (ref 6–20)
CHLORIDE: 95 mmol/L — AB (ref 101–111)
CO2: 30 mmol/L (ref 22–32)
Calcium: 10 mg/dL (ref 8.9–10.3)
Creatinine, Ser: 6.62 mg/dL — ABNORMAL HIGH (ref 0.61–1.24)
GFR, EST AFRICAN AMERICAN: 8 mL/min — AB (ref >60)
GFR, EST NON AFRICAN AMERICAN: 7 mL/min — AB (ref >60)
Glucose, Bld: 88 mg/dL (ref 65–99)
POTASSIUM: 4.2 mmol/L (ref 3.5–5.1)
Sodium: 137 mmol/L (ref 135–145)
TOTAL PROTEIN: 7.1 g/dL (ref 6.5–8.1)

## 2015-07-06 LAB — CBC WITH DIFFERENTIAL/PLATELET
BASOS ABS: 0 10*3/uL (ref 0–0.1)
Basophils Relative: 0 %
EOS PCT: 3 %
Eosinophils Absolute: 0.3 10*3/uL (ref 0–0.7)
HEMATOCRIT: 26.1 % — AB (ref 40.0–52.0)
Hemoglobin: 8.3 g/dL — ABNORMAL LOW (ref 13.0–18.0)
LYMPHS PCT: 9 %
Lymphs Abs: 0.9 10*3/uL — ABNORMAL LOW (ref 1.0–3.6)
MCH: 30 pg (ref 26.0–34.0)
MCHC: 31.8 g/dL — AB (ref 32.0–36.0)
MCV: 94.4 fL (ref 80.0–100.0)
MONO ABS: 0.9 10*3/uL (ref 0.2–1.0)
MONOS PCT: 8 %
NEUTROS ABS: 8.3 10*3/uL — AB (ref 1.4–6.5)
Neutrophils Relative %: 80 %
PLATELETS: 284 10*3/uL (ref 150–440)
RBC: 2.76 MIL/uL — ABNORMAL LOW (ref 4.40–5.90)
RDW: 16.5 % — AB (ref 11.5–14.5)
WBC: 10.4 10*3/uL (ref 3.8–10.6)

## 2015-07-06 LAB — MAGNESIUM: MAGNESIUM: 2.3 mg/dL (ref 1.7–2.4)

## 2015-07-06 LAB — TSH: TSH: 5.721 u[IU]/mL — AB (ref 0.350–4.500)

## 2015-07-07 LAB — VITAMIN D 25 HYDROXY (VIT D DEFICIENCY, FRACTURES): Vit D, 25-Hydroxy: 34.1 ng/mL (ref 30.0–100.0)

## 2015-07-07 LAB — VITAMIN B12: VITAMIN B 12: 214 pg/mL (ref 180–914)

## 2015-07-17 ENCOUNTER — Other Ambulatory Visit: Payer: Self-pay | Admitting: Vascular Surgery

## 2015-07-20 LAB — CBC WITH DIFFERENTIAL/PLATELET
Basophils Absolute: 0 10*3/uL (ref 0–0.1)
Basophils Relative: 1 %
Eosinophils Absolute: 0.1 10*3/uL (ref 0–0.7)
Eosinophils Relative: 2 %
HEMATOCRIT: 23.9 % — AB (ref 40.0–52.0)
HEMOGLOBIN: 7.6 g/dL — AB (ref 13.0–18.0)
LYMPHS ABS: 1.1 10*3/uL (ref 1.0–3.6)
LYMPHS PCT: 15 %
MCH: 29.8 pg (ref 26.0–34.0)
MCHC: 31.8 g/dL — AB (ref 32.0–36.0)
MCV: 93.6 fL (ref 80.0–100.0)
MONOS PCT: 9 %
Monocytes Absolute: 0.6 10*3/uL (ref 0.2–1.0)
NEUTROS ABS: 5.1 10*3/uL (ref 1.4–6.5)
NEUTROS PCT: 73 %
Platelets: 241 10*3/uL (ref 150–440)
RBC: 2.55 MIL/uL — AB (ref 4.40–5.90)
RDW: 16.9 % — ABNORMAL HIGH (ref 11.5–14.5)
WBC: 6.9 10*3/uL (ref 3.8–10.6)

## 2015-07-20 LAB — COMPREHENSIVE METABOLIC PANEL
ALK PHOS: 77 U/L (ref 38–126)
AST: 14 U/L — ABNORMAL LOW (ref 15–41)
Albumin: 3 g/dL — ABNORMAL LOW (ref 3.5–5.0)
Anion gap: 10 (ref 5–15)
BILIRUBIN TOTAL: 0.8 mg/dL (ref 0.3–1.2)
BUN: 29 mg/dL — ABNORMAL HIGH (ref 6–20)
CALCIUM: 9.6 mg/dL (ref 8.9–10.3)
CO2: 29 mmol/L (ref 22–32)
CREATININE: 5.43 mg/dL — AB (ref 0.61–1.24)
Chloride: 95 mmol/L — ABNORMAL LOW (ref 101–111)
GFR calc non Af Amer: 9 mL/min — ABNORMAL LOW (ref >60)
GFR, EST AFRICAN AMERICAN: 10 mL/min — AB (ref >60)
Glucose, Bld: 81 mg/dL (ref 65–99)
Potassium: 4.3 mmol/L (ref 3.5–5.1)
SODIUM: 134 mmol/L — AB (ref 135–145)
TOTAL PROTEIN: 6.5 g/dL (ref 6.5–8.1)

## 2015-07-20 LAB — VITAMIN B12: VITAMIN B 12: 471 pg/mL (ref 180–914)

## 2015-07-20 LAB — TSH: TSH: 6.448 u[IU]/mL — ABNORMAL HIGH (ref 0.350–4.500)

## 2015-07-26 ENCOUNTER — Encounter
Admission: RE | Admit: 2015-07-26 | Discharge: 2015-07-26 | Disposition: A | Discharge status: Home / Self Care | Payer: Medicare Other | Source: Ambulatory Visit | Attending: Internal Medicine | Admitting: Internal Medicine

## 2015-07-26 DIAGNOSIS — N189 Chronic kidney disease, unspecified: Secondary | ICD-10-CM | POA: Insufficient documentation

## 2015-07-26 DIAGNOSIS — D649 Anemia, unspecified: Secondary | ICD-10-CM | POA: Insufficient documentation

## 2015-07-27 ENCOUNTER — Encounter
Admission: RE | Disposition: A | Discharge status: Home / Self Care | Payer: Self-pay | Source: Ambulatory Visit | Attending: Vascular Surgery

## 2015-07-27 ENCOUNTER — Ambulatory Visit
Admission: RE | Admit: 2015-07-27 | Discharge: 2015-07-27 | Disposition: A | Discharge status: Home / Self Care | Payer: Medicare Other | Source: Ambulatory Visit | Attending: Vascular Surgery | Admitting: Vascular Surgery

## 2015-07-27 ENCOUNTER — Encounter: Payer: Self-pay | Admitting: *Deleted

## 2015-07-27 DIAGNOSIS — Z7982 Long term (current) use of aspirin: Secondary | ICD-10-CM | POA: Insufficient documentation

## 2015-07-27 DIAGNOSIS — T82858A Stenosis of vascular prosthetic devices, implants and grafts, initial encounter: Secondary | ICD-10-CM | POA: Insufficient documentation

## 2015-07-27 DIAGNOSIS — G20 Parkinson's disease: Secondary | ICD-10-CM | POA: Insufficient documentation

## 2015-07-27 DIAGNOSIS — Z992 Dependence on renal dialysis: Secondary | ICD-10-CM | POA: Diagnosis not present

## 2015-07-27 DIAGNOSIS — Z87891 Personal history of nicotine dependence: Secondary | ICD-10-CM | POA: Insufficient documentation

## 2015-07-27 DIAGNOSIS — Z79899 Other long term (current) drug therapy: Secondary | ICD-10-CM | POA: Insufficient documentation

## 2015-07-27 DIAGNOSIS — Z950 Presence of cardiac pacemaker: Secondary | ICD-10-CM | POA: Insufficient documentation

## 2015-07-27 DIAGNOSIS — I252 Old myocardial infarction: Secondary | ICD-10-CM | POA: Insufficient documentation

## 2015-07-27 DIAGNOSIS — I2510 Atherosclerotic heart disease of native coronary artery without angina pectoris: Secondary | ICD-10-CM | POA: Insufficient documentation

## 2015-07-27 DIAGNOSIS — I120 Hypertensive chronic kidney disease with stage 5 chronic kidney disease or end stage renal disease: Secondary | ICD-10-CM | POA: Diagnosis not present

## 2015-07-27 DIAGNOSIS — Y832 Surgical operation with anastomosis, bypass or graft as the cause of abnormal reaction of the patient, or of later complication, without mention of misadventure at the time of the procedure: Secondary | ICD-10-CM | POA: Diagnosis not present

## 2015-07-27 DIAGNOSIS — N186 End stage renal disease: Secondary | ICD-10-CM | POA: Insufficient documentation

## 2015-07-27 DIAGNOSIS — Z880 Allergy status to penicillin: Secondary | ICD-10-CM | POA: Diagnosis not present

## 2015-07-27 DIAGNOSIS — Z8673 Personal history of transient ischemic attack (TIA), and cerebral infarction without residual deficits: Secondary | ICD-10-CM | POA: Diagnosis not present

## 2015-07-27 HISTORY — PX: PERIPHERAL VASCULAR CATHETERIZATION: SHX172C

## 2015-07-27 LAB — POTASSIUM (ARMC VASCULAR LAB ONLY): POTASSIUM (ARMC VASCULAR LAB): 4.5 (ref 3.5–5.1)

## 2015-07-27 SURGERY — A/V SHUNTOGRAM/FISTULAGRAM
Anesthesia: Moderate Sedation

## 2015-07-27 MED ORDER — FENTANYL CITRATE (PF) 100 MCG/2ML IJ SOLN
INTRAMUSCULAR | Status: AC
  Filled 2015-07-27: qty 2

## 2015-07-27 MED ORDER — HEPARIN SODIUM (PORCINE) 1000 UNIT/ML IJ SOLN
INTRAMUSCULAR | Status: AC
  Filled 2015-07-27: qty 1

## 2015-07-27 MED ORDER — FENTANYL CITRATE (PF) 100 MCG/2ML IJ SOLN
INTRAMUSCULAR | Status: DC | PRN
  Administered 2015-07-27: 50 ug via INTRAVENOUS

## 2015-07-27 MED ORDER — MIDAZOLAM HCL 2 MG/2ML IJ SOLN
INTRAMUSCULAR | Status: DC | PRN
  Administered 2015-07-27: 2 mg via INTRAVENOUS

## 2015-07-27 MED ORDER — MIDAZOLAM HCL 5 MG/5ML IJ SOLN
INTRAMUSCULAR | Status: AC
  Filled 2015-07-27: qty 5

## 2015-07-27 MED ORDER — HEPARIN SODIUM (PORCINE) 1000 UNIT/ML IJ SOLN
INTRAMUSCULAR | Status: DC | PRN
  Administered 2015-07-27: 3000 [IU] via INTRAVENOUS

## 2015-07-27 MED ORDER — METHYLPREDNISOLONE SODIUM SUCC 125 MG IJ SOLR: 125.0000 mg | INTRAMUSCULAR | Status: DC | PRN

## 2015-07-27 MED ORDER — LIDOCAINE-EPINEPHRINE (PF) 1 %-1:200000 IJ SOLN
INTRAMUSCULAR | Status: AC
  Filled 2015-07-27: qty 30

## 2015-07-27 MED ORDER — IOHEXOL 300 MG/ML  SOLN
INTRAMUSCULAR | Status: DC | PRN
  Administered 2015-07-27: 40 mL via INTRAVENOUS

## 2015-07-27 MED ORDER — ONDANSETRON HCL 4 MG/2ML IJ SOLN: 4.0000 mg | Freq: Four times a day (QID) | INTRAMUSCULAR | Status: DC | PRN

## 2015-07-27 MED ORDER — SODIUM CHLORIDE 0.9 % IV SOLN
INTRAVENOUS | Status: DC
  Administered 2015-07-27: 10:00:00 via INTRAVENOUS

## 2015-07-27 MED ORDER — LIDOCAINE-EPINEPHRINE (PF) 1 %-1:200000 IJ SOLN
INTRAMUSCULAR | Status: DC | PRN
  Administered 2015-07-27: 10 mL via INTRADERMAL

## 2015-07-27 MED ORDER — FAMOTIDINE 20 MG PO TABS: 40.0000 mg | ORAL_TABLET | ORAL | Status: DC | PRN

## 2015-07-27 MED ORDER — HYDROMORPHONE HCL 1 MG/ML IJ SOLN: 1.0000 mg | Freq: Once | INTRAMUSCULAR | Status: DC

## 2015-07-27 MED ORDER — HEPARIN (PORCINE) IN NACL 2-0.9 UNIT/ML-% IJ SOLN
INTRAMUSCULAR | Status: AC
  Filled 2015-07-27: qty 1000

## 2015-07-27 MED ORDER — CLINDAMYCIN PHOSPHATE 300 MG/50ML IV SOLN
INTRAVENOUS | Status: AC
  Administered 2015-07-27: 10:00:00
  Filled 2015-07-27: qty 50

## 2015-07-27 MED ORDER — CLINDAMYCIN PHOSPHATE 300 MG/50ML IV SOLN: 300.0000 mg | Freq: Once | INTRAVENOUS | Status: DC

## 2015-07-27 SURGICAL SUPPLY — 17 items
BALLN ARMADA 12X60X80 (BALLOONS) ×3
BALLN ATLAS 12X40X75 (BALLOONS) ×3
BALLN ULTRVRSE 10X60X75 (BALLOONS) ×3
BALLOON ARMADA 12X60X80 (BALLOONS) ×1 IMPLANT
BALLOON ATLAS 12X40X75 (BALLOONS) ×1 IMPLANT
BALLOON ULTRVRSE 10X60X75 (BALLOONS) ×1 IMPLANT
CANNULA 5F STIFF (CANNULA) ×3 IMPLANT
CATH KA2 5FR 65CM (CATHETERS) ×3 IMPLANT
DEVICE PRESTO INFLATION (MISCELLANEOUS) ×3 IMPLANT
DEVICE TORQUE (MISCELLANEOUS) ×3 IMPLANT
DRAPE BRACHIAL (DRAPES) ×3 IMPLANT
GLIDEWIRE STIFF .35X180X3 HYDR (WIRE) ×3 IMPLANT
PACK ANGIOGRAPHY (CUSTOM PROCEDURE TRAY) ×3 IMPLANT
SHEATH BRITE TIP 6FRX5.5 (SHEATH) ×3 IMPLANT
SHEATH BRITE TIP 7FRX5.5 (SHEATH) ×3 IMPLANT
TOWEL OR 17X26 4PK STRL BLUE (TOWEL DISPOSABLE) ×3 IMPLANT
WIRE MAGIC TOR.035 180C (WIRE) ×3 IMPLANT

## 2015-07-27 NOTE — H&P (Signed)
  [facility] History & Physical Update  The patient was interviewed and re-examined.  The patient's previous History and Physical has been reviewed and is unchanged.  There is no change in the plan of care. We plan to proceed with the scheduled procedure.  DEW,JASON, MD  07/27/2015, 9:47 AM

## 2015-07-27 NOTE — Progress Notes (Signed)
Report called to pt's care nurse with [facility] and plan reviewed, to  Restart previous regimen from pre procedure.

## 2015-07-27 NOTE — Progress Notes (Signed)
Pt clinically stable post fistulogram, wife present, DR Wyn Quaker in to speak with pt/wife with questions answered,  No bleeding at site.

## 2015-07-27 NOTE — Discharge Instructions (Signed)
Fistulogram, Care After °Refer to this sheet in the next few weeks. These instructions provide you with information on caring for yourself after your procedure. Your health care provider may also give you more specific instructions. Your treatment has been planned according to current medical practices, but problems sometimes occur. Call your health care provider if you have any problems or questions after your procedure. °WHAT TO EXPECT AFTER THE PROCEDURE °After your procedure, it is typical to have the following: °· A small amount of discomfort in the area where the catheters were placed. °· A small amount of bruising around the fistula. °· Sleepiness and fatigue. °HOME CARE INSTRUCTIONS °· Rest at home for the day following your procedure. °· Do not drive or operate heavy machinery while taking pain medicine. °· Take medicines only as directed by your health care provider. °· Do not take baths, swim, or use a hot tub until your health care provider approves. You may shower 24 hours after the procedure or as directed by your health care provider. °· There are many different ways to close and cover an incision, including stitches, skin glue, and adhesive strips. Follow your health care provider's instructions on: °¨ Incision care. °¨ Bandage (dressing) changes and removal. °¨ Incision closure removal. °· Monitor your dialysis fistula carefully. °SEEK MEDICAL CARE IF: °· You have drainage, redness, swelling, or pain at your catheter site. °· You have a fever. °· You have chills. °SEEK IMMEDIATE MEDICAL CARE IF: °· You feel weak. °· You have trouble balancing. °· You have trouble moving your arms or legs. °· You have problems with your speech or vision. °· You can no longer feel a vibration or buzz when you put your fingers over your dialysis fistula. °· The limb that was used for the procedure: °¨ Swells. °¨ Is painful. °¨ Is cold. °¨ Is discolored, such as blue or pale white. °  °This information is not intended  to replace advice given to you by your health care provider. Make sure you discuss any questions you have with your health care provider. °  °Document Released: 10/25/2013 Document Reviewed: 10/25/2013 °Elsevier Interactive Patient Education ©2016 [employer] Inc. ° °

## 2015-07-27 NOTE — Op Note (Signed)
[facility]    OPERATIVE NOTE   PROCEDURE: 1.    Left brahiocephlic arteriovenous fistula cannulation under ultrasound guidance 2.    left arm fistulagram including central venogram 3.    Percutaneous transluminal angiopasty of left subclavian and innominate vein with 10 and 12 mm diameter angioplasty alloon  PRE-OPERATIVE DIAGNOSIS: 1. ESRD 2. 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 High grade central stenosis around the pacer wires  SPECIMEN(S):  None  CONTRAST: 40 cc  FLUORO TIME: 5.5 minutes  MODERATE CONSCIOUS SEDATION TIME: Approximately 30 min with 2 mg of Versed and 50 mcg of Fentanyl   INDICATIONS: [name] is a 82 y.o. male who presents with malfunctioning  left brachiocephalic arteriovenous fistula.  The patient is scheduled for  left arm fistulagram.  The patient is aware the risks include but are not limited to: bleeding, infection, thrombosis of the cannulated access, and possible anaphylactic reaction to the contrast.  The patient is aware of the risks of the procedure and elects to proceed forward.  DESCRIPTION: After full informed written consent was obtained, the patient was brought back to the angiography suite and placed supine upon the angiography table.  The patient was connected to monitoring equipment. Moderate conscious sedation was administered throughout the procedure with my supervision of the RN administering medicines and monitoring the patient's vital signs and mental status throughout from the start of the procedure until the patient was taken to the recovery room. The  left arm was prepped and draped in the standard fashion for a percutaneous access intervention. The patient was monitored by and are in under my direct supervision throughout  the entirety of the procedure, and his vital signs, mental status, pulse oximetry, and telemetry were monitored. Under ultrasound guidance, the  arterial access site of the left brachiocephalic arteriovenous fistula was cannulated with a micropuncture needle under direct ultrasound guidance and a permanent image was performed.  The microwire was advanced into the fistula and the needle was exchanged for the a microsheath.  I then upsized to a 6 Fr Sheath and imaging was performed.  Hand injections were completed to image the access including the central venous system. This demonstrated a high-grade stenosis in the 80-90% range in the left subclavian vein associated with his pacer wires. The remainder of the fistula appeared reasonably patent with no greater than 30% stenosis identified.  Based on the images, this patient will need intervention to this area. I then gave the patient 3000 units of intravenous heparin.  I then crossed the stenosis with a Magic Tourqe wire.  Based on the imaging, a 10 mm x 6 cm  angioplasty balloon was selected.  The balloon was centered around the left subclavian vein and into the innominate vein stenosis and inflated to 10 ATM for 1 minute(s).  On completion imaging, a 50-60 % residual stenosis was present.   I then upsized to a 12 mm diameter by 4 cm length high pressure angioplasty balloon after increasing the sheath size to a 7 [place] sheath. This balloon was inflated to 20 atm for 1 minute. Completion angiogram following this inflation demonstrated only about a 15-20% residual stenosis   Based on the completion imaging, no further intervention is necessary.  The wire and balloon were removed from the sheath.  A 4-0  Monocryl purse-string suture was sewn around the sheath.  The sheath was removed while tying down the suture.  A sterile bandage was applied to the puncture site.  COMPLICATIONS: None  CONDITION: Stable   DEW,JASON  07/27/2015 10:44 AM

## 2015-07-28 ENCOUNTER — Encounter: Payer: Self-pay | Admitting: Vascular Surgery

## 2015-07-31 NOTE — H&P (Signed)
[facility] Admission History & Physical  MRN : [id number]  [name] is a 82 y.o. (Aug 02, 1928) male who presents with chief complaint of No chief complaint on file. Marland Kitchen  History of Present Illness: Patient is referred from his dialysis access center for evaluation of his left arm AV fistula. He has had prolonged bleeding which has worsened over the past several months. 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 A duplex did not show any obvious stenosis within the AV fistula, but he does have a pacemaker ipsilateral to the side and the central veins were not well-visualized on duplex. He has no other complaints. He denies fevers or chills or signs of systemic infection.  No current facility-administered medications for this encounter.   Current Outpatient Prescriptions  Medication Sig Dispense Refill  . aspirin EC 325 MG tablet Take 325 mg by mouth 2 (two) times daily.     Marland Kitchen azithromycin (ZITHROMAX) 250 MG tablet Take 250 mg by mouth daily.    . calcium acetate (PHOSLO) 667 MG capsule Take 2 capsules (1,334 mg total) by mouth 3 (three) times daily with meals. 90 capsule 0  . carbidopa-levodopa (SINEMET IR) 25-100 MG tablet Take 1 tablet by mouth 2 (two) times daily.  2  . carvedilol (COREG) 3.125 MG tablet Take 1 tablet (3.125 mg total) by mouth 2 (two) times daily with a meal. 60 tablet 0  . collagenase (SANTYL) ointment Apply 1 application topically daily.    . cyanocobalamin 1000 MCG tablet Take 1,000 mcg by mouth daily.    Marland Kitchen gemfibrozil (LOPID) 600 MG tablet Take 1 tablet by mouth 2 (two) times daily.  2  . guaifenesin (ROBITUSSIN) 100 MG/5ML syrup Take 200 mg by mouth 3 (three) times daily as needed for cough.    . SENSIPAR 30 MG tablet Take 1 tablet by mouth daily.  10  . lovastatin (MEVACOR) 20 MG tablet Take 20 mg by mouth every evening.    . midodrine (PROAMATINE) 10 MG tablet Take 1 tablet  by mouth every dialysis.  3  . Multiple Vitamins-Minerals (RENAPLEX-D PO) Take 1 tablet by mouth every morning.    . Nutritional Supplements (FEEDING SUPPLEMENT, NEPRO CARB STEADY,) LIQD Take 237 mLs by mouth daily. 237 mL 0  . omeprazole (PRILOSEC) 40 MG capsule Take 1 capsule by mouth daily.  0  . oxyCODONE (OXY IR/ROXICODONE) 5 MG immediate release tablet Take 1-2 tablets (5-10 mg total) by mouth every 4 (four) hours as needed for moderate pain. 30 tablet 0    Past Medical History  Diagnosis Date  . Renal disorder   . Stroke (HCC)   . Coronary artery disease   . Myocardial infarction (HCC)   . Parkinson disease (HCC)   . Chronic home hemodialysis status [facility])     Past Surgical History  Procedure Laterality Date  . Pacemaker insertion    . Orif ankle fracture Left 05/31/2015    Procedure: OPEN REDUCTION INTERNAL FIXATION TRIMALLEOLAR IANKLE FRACTURE DISLOCATION;  Surgeon: Christena Flake, MD;  Location: [facility] ORS;  Service: Orthopedics;  Laterality: Left;  . Peripheral vascular catheterization N/A 07/27/2015    Procedure: A/V Shuntogram/Fistulagram;  Surgeon: Annice Needy, MD;  Location: [facility] INVASIVE CV LAB;  Service: Cardiovascular;  Laterality: N/A;  . Peripheral vascular catheterization N/A 07/27/2015    Procedure: A/V Shunt Intervention;  Surgeon: Annice Needy, MD;  Location: [facility] INVASIVE CV LAB;  Service: Cardiovascular;  Laterality: N/A;    Social History Social History  Substance Use Topics  . Smoking status: Former Smoker    Quit date: 05/24/1969  . Smokeless tobacco: None  . Alcohol Use: No  No IVDU  Family History No history of bleeding disorders, clotting disorders, autoimmune diseases, or aneurysms  Allergies  Allergen Reactions  . Penicillins Swelling     REVIEW OF SYSTEMS (Negative unless checked)  Constitutional: Weight loss  Fever  Chills Cardiac: Chest pain   Chest pressure   Palpitations   Shortness of breath when laying flat   Shortness of  breath at rest   Shortness of breath with exertion. Vascular:  Pain in legs with walking   Pain in legs at rest   Pain in legs when laying flat   Claudication   Pain in feet when walking  Pain in feet at rest  Pain in feet when laying flat   History of DVT   Phlebitis   Swelling in legs   Varicose veins   Non-healing ulcers Pulmonary:   Uses home oxygen   Productive cough   Hemoptysis   Wheeze  COPD   Asthma Neurologic:  Dizziness  Blackouts   Seizures   History of stroke   History of TIA  Aphasia   Temporary blindness   Dysphagia   Weakness or numbness in arms   Weakness or numbness in legs Musculoskeletal:  Arthritis   Joint swelling   Joint pain   Low back pain Hematologic:  Easy bruising  Easy bleeding   Hypercoagulable state   Anemic  Hepatitis Gastrointestinal:  Blood in stool   Vomiting blood  Gastroesophageal reflux/heartburn   Difficulty swallowing. Genitourinary:  Chronic kidney disease   Difficult urination  Frequent urination  Burning with urination   Blood in urine Skin:  Rashes   Ulcers   Wounds Psychological:  History of anxiety    History of major depression.  Physical Examination  Filed Vitals:   07/27/15 0950 07/27/15 1051 07/27/15 1115 07/27/15 1128  BP: 118/53 102/51 100/45 120/85  Pulse: 61 61 61 68  Temp: 97.8 F (36.6 C)     TempSrc: Oral     Resp:  Height:  (1.727 m)     Weight: 94.348 kg (208 lb)     SpO2: 100% 99% 98%    Body mass index is 31.63 kg/(m^2). Gen: WD/WN, NAD Head: [place]/AT, No temporalis wasting. Prominent temp pulse not noted. Ear/Nose/Throat: Hearing grossly intact, nares w/o erythema or drainage, oropharynx w/o Erythema/Exudate,  Eyes: PERRLA, EOMI.  Neck: Supple, no nuchal rigidity.  No JVD.  Pulmonary:  Good air movement, no use of accessory muscles.  Cardiac: RRR, normal S1, S2. Vascular: good thrill and bruit in left arm  AVF which is aneurysmal Vessel Right Left  Radial Palpable Palpable                                   Gastrointestinal: soft, non-tender/non-distended. No guarding/reflex.  Musculoskeletal: M/S 5/5 throughout.  Extremities without ischemic changes.  No deformity or atrophy.  Neurologic: CN 2-12 intact. Pain and light touch intact in extremities.  Symmetrical.  Speech is fluent. Motor exam as listed above. Psychiatric: Judgment intact, Mood & affect appropriate for pt's clinical situation. Dermatologic: No rashes or ulcers noted.  No cellulitis or open wounds. Some scabs seen overlying access sites on AVF Lymph : No  Cervical, Axillary, or Inguinal lymphadenopathy.    CBC Lab Results  Component Value Date   WBC 6.9 07/20/2015   HGB 7.6* 07/20/2015   HCT 23.9* 07/20/2015   MCV 93.6 07/20/2015   PLT 241 07/20/2015    BMET    Component Value Date/Time   NA 134* 07/20/2015 0752   NA 140 01/11/2014 1412   K 4.3 07/20/2015 0752   K 5.4* 01/11/2014 1412   CL 95* 07/20/2015 0752   CL 101 01/11/2014 1412   CO2 29 07/20/2015 0752   CO2 28 01/11/2014 1412   GLUCOSE 81 07/20/2015 0752   GLUCOSE 131* 01/11/2014 1412   BUN 29* 07/20/2015 0752   BUN 73* 01/11/2014 1412   CREATININE 5.43* 07/20/2015 0752   CREATININE 7.96* 01/11/2014 1412   CALCIUM 9.6 07/20/2015 0752   CALCIUM 8.2* 01/11/2014 1412   GFRNONAA 9* 07/20/2015 0752   GFRNONAA 6* 01/11/2014 1412   GFRAA 10* 07/20/2015 0752   GFRAA 7* 01/11/2014 1412   Estimated Creatinine Clearance: 11.3 mL/min (by C-G formula based on Cr of 5.43).  COAG Lab Results  Component Value Date   INR 1.14 05/30/2015   INR 1.1 01/11/2014    Radiology No results found.    Assessment/Plan 1. Dysfunction of dialysis access with prolonged bleeding and aneurysm.  Particularly with pacer on that side, suspect central venous issue. Fistulogram performed today for further evaluation. Risks and benefits discussed. 2. End-stage renal  disease. Using left arm AV fistula now for several years. 3. Cardiac arrhythmia status postpacemaker placement. Suspect a central venous issue secondary to that. 4. Hypertension. Stable on outpatient medications.   Samauri Kellenberger, MD  07/31/2015 8:53 AM

## 2015-08-01 ENCOUNTER — Encounter: Payer: Medicare Other | Attending: Internal Medicine | Admitting: Internal Medicine

## 2015-08-01 DIAGNOSIS — X58XXXS Exposure to other specified factors, sequela: Secondary | ICD-10-CM | POA: Diagnosis not present

## 2015-08-01 DIAGNOSIS — Z992 Dependence on renal dialysis: Secondary | ICD-10-CM | POA: Insufficient documentation

## 2015-08-01 DIAGNOSIS — N186 End stage renal disease: Secondary | ICD-10-CM | POA: Insufficient documentation

## 2015-08-01 DIAGNOSIS — G20 Parkinson's disease: Secondary | ICD-10-CM | POA: Diagnosis not present

## 2015-08-01 DIAGNOSIS — Z880 Allergy status to penicillin: Secondary | ICD-10-CM | POA: Insufficient documentation

## 2015-08-01 DIAGNOSIS — L97312 Non-pressure chronic ulcer of right ankle with fat layer exposed: Secondary | ICD-10-CM | POA: Insufficient documentation

## 2015-08-01 DIAGNOSIS — S82852S Displaced trimalleolar fracture of left lower leg, sequela: Secondary | ICD-10-CM | POA: Insufficient documentation

## 2015-08-01 DIAGNOSIS — Z950 Presence of cardiac pacemaker: Secondary | ICD-10-CM | POA: Insufficient documentation

## 2015-08-01 DIAGNOSIS — Z955 Presence of coronary angioplasty implant and graft: Secondary | ICD-10-CM | POA: Insufficient documentation

## 2015-08-01 DIAGNOSIS — I2510 Atherosclerotic heart disease of native coronary artery without angina pectoris: Secondary | ICD-10-CM | POA: Insufficient documentation

## 2015-08-01 DIAGNOSIS — G4730 Sleep apnea, unspecified: Secondary | ICD-10-CM | POA: Insufficient documentation

## 2015-08-01 DIAGNOSIS — Z87891 Personal history of nicotine dependence: Secondary | ICD-10-CM | POA: Insufficient documentation

## 2015-08-02 NOTE — Progress Notes (Signed)
[name] ([id number]) Visit Report for 08/01/2015 Allergy List Details Patient Name: [name], [name]. Date of Service: 08/01/2015 8:45 AM Medical Record Number: [id number] Patient Account Number: [id number] Date of Birth/Sex: 03-26-1929 (81 y.o. Male) Treating RN: Clover Mealy, RN, BSN, [place] Sink Primary Care Physician: Einar Crow Other Clinician: Referring Physician: Blanchard Mane Treating Physician/Extender: Maxwell Caul Weeks in Treatment: 0 Allergies Active Allergies Penicillins Allergy Notes Electronic Signature(s) Signed: 08/01/2015 3:17:41 PM By: Elpidio Eric BSN, RN Entered By: Elpidio Eric on 08/01/2015 08:25:53 [name], [name] ([id number]) -------------------------------------------------------------------------------- Arrival Information Details Patient Name: [name]. Date of Service: 08/01/2015 8:45 AM Medical Record Number: [id number] Patient Account Number: [id number] Date of Birth/Sex: 04-22-1929 (82 y.o. Male) Treating RN: Clover Mealy, RN, BSN, [place] Sink Primary Care Physician: Einar Crow Other Clinician: Referring Physician: Blanchard Mane Treating Physician/Extender: Altamese [place] in Treatment: 0 Visit Information Patient Arrived: Wheel Chair Arrival Time: 08:49 Accompanied By: son Transfer Assistance: Nurse, adult Patient Identification Verified: Yes Secondary Verification Process Yes Completed: Patient Requires Transmission-Based No Precautions: Patient Has Alerts: No Electronic Signature(s) Signed: 08/01/2015 3:17:41 PM By: Elpidio Eric BSN, RN Entered By: Elpidio Eric on 08/01/2015 08:51:49 [name], [name] ([id number]) -------------------------------------------------------------------------------- Clinic Level of Care Assessment Details Patient Name: [name]. Date of Service: 08/01/2015 8:45 AM Medical Record Number: [id number] Patient Account Number: [id number] Date of Birth/Sex: 04/28/1929 (82 y.o. Male) Treating RN: Clover Mealy, RN, BSN,  [place] Sink Primary Care Physician: Einar Crow Other Clinician: Referring Physician: Blanchard Mane Treating Physician/Extender: Altamese [place] in Treatment: 0 Clinic Level of Care Assessment Items TOOL 1 Quantity Score []  - Use when EandM and Procedure is performed on INITIAL visit 0 ASSESSMENTS - Nursing Assessment / Reassessment X - General Physical Exam (combine w/ comprehensive assessment (listed just 1 20 below) when performed on new pt. evals) X - Comprehensive Assessment (HX, ROS, Risk Assessments, Wounds Hx, etc.) 1 25 ASSESSMENTS - Wound and Skin Assessment / Reassessment []  - Dermatologic / Skin Assessment (not related to wound area) 0 ASSESSMENTS - Ostomy and/or Continence Assessment and Care []  - Incontinence Assessment and Management 0 []  - Ostomy Care Assessment and Management (repouching, etc.) 0 PROCESS - Coordination of Care X - Simple Patient / Family Education for ongoing care 1 15 []  - Complex (extensive) Patient / Family Education for ongoing care 0 X - Staff obtains Chiropractor, Records, Test Results / Process Orders 1 10 X - Staff telephones HHA, Nursing Homes / Clarify orders / etc 1 10 []  - Routine Transfer to another Facility (non-emergent condition) 0 []  - Routine Hospital Admission (non-emergent condition) 0 X - New Admissions / Manufacturing engineer / Ordering NPWT, Apligraf, etc. 1 15 []  - Emergency Hospital Admission (emergent condition) 0 PROCESS - Special Needs []  - Pediatric / Minor Patient Management 0 []  - Isolation Patient Management 0 [name], [name] ([id number]) []  - Hearing / Language / Visual special needs 0 []  - Assessment of Community assistance (transportation, D/C planning, etc.) 0 []  - Additional assistance / Altered mentation 0 []  - Support Surface(s) Assessment (bed, cushion, seat, etc.) 0 INTERVENTIONS - Miscellaneous []  - External ear exam 0 X - Patient Transfer (multiple staff / Nurse, adult / Similar devices) 1 10 []  -  Simple Staple / Suture removal (25 or less) 0 []  - Complex Staple / Suture removal (26 or more) 0 []  - Hypo/Hyperglycemic Management (do not check if billed separately) 0 X - Ankle / Brachial Index (ABI) - do not check if billed separately 1 15  Has the patient been seen at the hospital within the last three years: Yes Total Score: 120 Level Of Care: New/Established - Level 4 Electronic Signature(s) Signed: 08/01/2015 3:17:41 PM By: Elpidio Eric BSN, RN Entered By: Elpidio Eric on 08/01/2015 09:57:10 [name], [name] ([id number]) -------------------------------------------------------------------------------- Encounter Discharge Information Details Patient Name: [name]. Date of Service: 08/01/2015 8:45 AM Medical Record Number: [id number] Patient Account Number: [id number] Date of Birth/Sex: December 19, 1928 (83 y.o. Male) Treating RN: Clover Mealy, RN, BSN, [place] Sink Primary Care Physician: Einar Crow Other Clinician: Referring Physician: Blanchard Mane Treating Physician/Extender: Altamese [place] in Treatment: 0 Encounter Discharge Information Items Discharge Pain Level: 0 Discharge Condition: Stable Ambulatory Status: Wheelchair Discharge Destination: Nursing Home Transportation: Other Accompanied By: son Schedule Follow-up Appointment: No Medication Reconciliation completed No and provided to Patient/Care Bobbi Kozakiewicz: Provided on Clinical Summary of Care: 08/01/2015 Form Type Recipient Paper Patient [facility] Electronic Signature(s) Signed: 08/01/2015 10:14:55 AM By: Gwenlyn Perking Entered By: Gwenlyn Perking on 08/01/2015 10:14:55 [name] ([id number]) -------------------------------------------------------------------------------- Lower Extremity Assessment Details Patient Name: [name]. Date of Service: 08/01/2015 8:45 AM Medical Record Number: [id number] Patient Account Number: [id number] Date of Birth/Sex: [birth year] (82 y.o. Male) Treating RN: Clover Mealy, RN, BSN,  [place] Sink Primary Care Physician: Einar Crow Other Clinician: Referring Physician: Blanchard Mane Treating Physician/Extender: Maxwell Caul Weeks in Treatment: 0 Edema Assessment Assessed: [Left: No] [Right: No] Edema: [Left: Ye] [Right: s] Vascular Assessment Pulses: Posterior Tibial Palpable: [Left:No] Doppler: [Left:Monophasic] Dorsalis Pedis Palpable: [Left:No] Doppler: [Left:Monophasic] Extremity colors, hair growth, and conditions: Extremity Color: [Left:Mottled] Hair Growth on Extremity: [Left:Yes] Temperature of Extremity: [Left:Warm] Capillary Refill: [Left:< 3 seconds] Blood Pressure: Brachial: [Left:99] Dorsalis Pedis: 20 [Left:Dorsalis Pedis:] Ankle: Posterior Tibial: 40 [Left:Posterior Tibial: 0.40] Toe Nail Assessment Left: Right: Thick: Yes Discolored: Yes Deformed: No Improper Length and Hygiene: No Electronic Signature(s) Signed: 08/01/2015 3:17:41 PM By: Elpidio Eric BSN, RN Entered By: Elpidio Eric on 08/01/2015 09:25:20 [name], [name] ([id number]) -------------------------------------------------------------------------------- Multi Wound Chart Details Patient Name: [name], [name]. Date of Service: 08/01/2015 8:45 AM Medical Record Number: [id number] Patient Account Number: [id number] Date of Birth/Sex: 10/10/1928 (82 y.o. Male) Treating RN: Clover Mealy, RN, BSN, [place] Sink Primary Care Physician: Einar Crow Other Clinician: Referring Physician: Blanchard Mane Treating Physician/Extender: Maxwell Caul Weeks in Treatment: 0 Vital Signs Height(in): 69 Pulse(bpm): 64 Weight(lbs): 205 Blood Pressure 99/53 (mmHg): Body Mass Index(BMI): 30 Temperature(F): 98 Respiratory Rate 17 (breaths/min): Photos: [1:No Photos] [2:No Photos] [3:No Photos] Wound Location: [1:Left Foot - Plantar] [2:Left Malleolus - Medial] [3:Left Malleolus - Lateral] Wounding Event: [1:Gradually Appeared] [2:Gradually Appeared] [3:Gradually Appeared] Primary Etiology:  [1:Pressure Ulcer] [2:Pressure Ulcer] [3:Pressure Ulcer] Comorbid History: [1:Cataracts, Sleep Apnea, Cataracts, Sleep Apnea, Cataracts, Sleep Apnea, Coronary Artery Disease, Coronary Artery Disease, Coronary Artery Disease, End Stage Renal Disease, End Stage Renal Disease, End Stage Renal Disease, Gout]  [2:Gout] [3:Gout] Date Acquired: [1:07/04/2015] [2:07/04/2015] [3:07/04/2015] Weeks of Treatment: [1:0] [2:0] [3:0] Wound Status: [1:Open] [2:Open] [3:Open] Measurements L x W x D 2.4x2x0.1 [2:9.5x6x0.3] [3:6.7x6x0.2] (cm) Area (cm) : [1:3.77] [2:44.768] [3:31.573] Volume (cm) : [1:0.377] [2:13.43] [3:6.315] % Reduction in Area: [1:0.00%] [2:0.00%] [3:0.00%] % Reduction in Volume: 0.00% [2:0.00%] [3:0.00%] Classification: [1:Unstageable/Unclassified Category/Stage IV] [3:Category/Stage II] Exudate Amount: [1:None Present] [2:Medium] [3:Medium] Exudate Type: [1:N/A] [2:Serosanguineous] [3:Serosanguineous] Exudate Color: [1:N/A] [2:red, brown] [3:red, brown] Wound Margin: [1:Distinct, outline attached Distinct, outline attached Distinct, outline attached] Granulation Amount: [1:None Present (0%)] [2:Small (1-33%)] [3:Small (1-33%)] Necrotic Amount: [1:Large (67-100%)] [2:Large (67-100%)] [3:Large (67-100%)] Necrotic Tissue: [1:Eschar] [2:Eschar] [3:Eschar, Adherent Slough] Exposed Structures: [  1:Fascia: No Fat: No Tendon: No Muscle: No Joint: No] [2:Fascia: No Fat: No Tendon: No Muscle: No Joint: No] [3:Fascia: No Fat: No Tendon: No Muscle: No Joint: No] Bone: No Bone: No Bone: No Limited to Skin Limited to Skin Limited to Skin Breakdown Breakdown Breakdown Epithelialization: None N/A None Periwound Skin Texture: Edema: No Edema: No Edema: No Excoriation: No Excoriation: No Excoriation: No Induration: No Induration: No Induration: No Callus: No Callus: No Callus: No Crepitus: No Crepitus: No Crepitus: No Fluctuance: No Fluctuance: No Fluctuance: No Friable: No Friable:  No Friable: No Rash: No Rash: No Rash: No Scarring: No Scarring: No Scarring: No Periwound Skin Moist: Yes Maceration: Yes Moist: Yes Moisture: Dry/Scaly: Yes Moist: Yes Dry/Scaly: Yes Maceration: No Dry/Scaly: No Maceration: No Periwound Skin Color: Atrophie Blanche: No Atrophie Blanche: No Atrophie Blanche: No Cyanosis: No Cyanosis: No Cyanosis: No Ecchymosis: No Ecchymosis: No Ecchymosis: No Erythema: No Erythema: No Erythema: No Hemosiderin Staining: No Hemosiderin Staining: No Hemosiderin Staining: No Mottled: No Mottled: No Mottled: No Pallor: No Pallor: No Pallor: No Rubor: No Rubor: No Rubor: No Temperature: No Abnormality No Abnormality No Abnormality Tenderness on Yes No No Palpation: Wound Preparation: Ulcer Cleansing: Ulcer Cleansing: Ulcer Cleansing: Rinsed/Irrigated with Rinsed/Irrigated with Rinsed/Irrigated with Saline Saline Saline Topical Anesthetic Topical Anesthetic Topical Anesthetic Applied: Other: lidocaine Applied: Other: lidocaine Applied: Other: lidocaine 4% 4% 4% Treatment Notes Electronic Signature(s) Signed: 08/01/2015 3:17:41 PM By: Elpidio Eric BSN, RN Entered By: Elpidio Eric on 08/01/2015 09:42:21 [name], [name] ([id number]) -------------------------------------------------------------------------------- Multi-Disciplinary Care Plan Details Patient Name: [name], [name]. Date of Service: 08/01/2015 8:45 AM Medical Record Number: [id number] Patient Account Number: [id number] Date of Birth/Sex: Apr 05, 1929 (82 y.o. Male) Treating RN: Clover Mealy, RN, BSN, [place] Sink Primary Care Physician: Einar Crow Other Clinician: Referring Physician: Blanchard Mane Treating Physician/Extender: Altamese [place] in Treatment: 0 Active Inactive Abuse / Safety / Falls / Self Care Management Nursing Diagnoses: Impaired home maintenance Impaired physical mobility Knowledge deficit related to: safety; personal, health (wound),  emergency Potential for falls Self care deficit: actual or potential Goals: Patient/caregiver will verbalize understanding of skin care regimen Date Initiated: 08/01/2015 Goal Status: Active Patient/caregiver will verbalize/demonstrate measure taken to improve self care Date Initiated: 08/01/2015 Goal Status: Active Patient/caregiver will verbalize/demonstrate measures taken to improve the patient's personal safety Date Initiated: 08/01/2015 Goal Status: Active Patient/caregiver will verbalize/demonstrate understanding of what to do in case of emergency Date Initiated: 08/01/2015 Goal Status: Active Interventions: Assess fall risk on admission and as needed Assess: immobility, friction, shearing, incontinence upon admission and as needed Assess impairment of mobility on admission and as needed per policy Assess self care needs on admission and as needed Provide education on basic hygiene Provide education on fall prevention Provide education on personal and home safety Provide education on safe transfers Notes: [name], CYPRIAN GONGAWARE ([id number]) Orientation to the Wound Care Program Nursing Diagnoses: Knowledge deficit related to the wound healing center program Goals: Patient/caregiver will verbalize understanding of the Wound Healing Center Program Date Initiated: 08/01/2015 Goal Status: Active Interventions: Provide education on orientation to the wound center Notes: Pressure Nursing Diagnoses: Knowledge deficit related to management of pressures ulcers Potential for impaired tissue integrity related to pressure, friction, moisture, and shear Goals: Patient will remain free from development of additional pressure ulcers Date Initiated: 08/01/2015 Goal Status: Active Patient will remain free of pressure ulcers Date Initiated: 08/01/2015 Goal Status: Active Patient/caregiver will verbalize risk factors for pressure ulcer development Date Initiated: 08/01/2015 Goal Status:  Active Patient/caregiver  will verbalize understanding of pressure ulcer management Date Initiated: 08/01/2015 Goal Status: Active Interventions: Assess: immobility, friction, shearing, incontinence upon admission and as needed Assess offloading mechanisms upon admission and as needed Assess potential for pressure ulcer upon admission and as needed Provide education on pressure ulcers Treatment Activities: Patient referred for pressure reduction/relief devices : 08/01/2015 Patient referred for seating evaluation to ensure proper offloading : 08/01/2015 Pressure reduction/relief device ordered : 08/01/2015 [name], [name] ([id number]) Notes: Wound/Skin Impairment Nursing Diagnoses: Impaired tissue integrity Knowledge deficit related to smoking impact on wound healing Knowledge deficit related to ulceration/compromised skin integrity Goals: Patient/caregiver will verbalize understanding of skin care regimen Date Initiated: 08/01/2015 Goal Status: Active Ulcer/skin breakdown will have a volume reduction of 30% by week 4 Date Initiated: 08/01/2015 Goal Status: Active Ulcer/skin breakdown will have a volume reduction of 50% by week 8 Date Initiated: 08/01/2015 Goal Status: Active Ulcer/skin breakdown will have a volume reduction of 80% by week 12 Date Initiated: 08/01/2015 Goal Status: Active Ulcer/skin breakdown will heal within 14 weeks Date Initiated: 08/01/2015 Goal Status: Active Interventions: Assess patient/caregiver ability to obtain necessary supplies Assess patient/caregiver ability to perform ulcer/skin care regimen upon admission and as needed Assess ulceration(s) every visit Provide education on smoking Provide education on ulcer and skin care Treatment Activities: Skin care regimen initiated : 08/01/2015 Topical wound management initiated : 08/01/2015 Notes: Electronic Signature(s) Signed: 08/01/2015 3:17:41 PM By: Elpidio Eric BSN, RN Entered By: Elpidio Eric on 08/01/2015  09:42:05 [name], [name] ([id number]) -------------------------------------------------------------------------------- Pain Assessment Details Patient Name: Khim, [name]. Date of Service: 08/01/2015 8:45 AM Medical Record Number: [id number] Patient Account Number: [id number] Date of Birth/Sex: 1929-02-17 (82 y.o. Male) Treating RN: Clover Mealy, RN, BSN, [place] Sink Primary Care Physician: Einar Crow Other Clinician: Referring Physician: Blanchard Mane Treating Physician/Extender: Maxwell Caul Weeks in Treatment: 0 Active Problems Location of Pain Severity and Description of Pain Patient Has Paino No Site Locations Pain Management and Medication Current Pain Management: Electronic Signature(s) Signed: 08/01/2015 3:17:41 PM By: Elpidio Eric BSN, RN Entered By: Elpidio Eric on 08/01/2015 08:51:57 [name], [name] ([id number]) -------------------------------------------------------------------------------- Patient/Caregiver Education Details Patient Name: Durkin, [name]. Date of Service: 08/01/2015 8:45 AM Medical Record Number: [id number] Patient Account Number: [id number] Date of Birth/Gender: 11/23/1928 (82 y.o. Male) Treating RN: Clover Mealy, RN, BSN, [place] Sink Primary Care Physician: Einar Crow Other Clinician: Referring Physician: Blanchard Mane Treating Physician/Extender: Altamese [place] in Treatment: 0 Education Assessment Education Provided To: Patient Education Topics Provided Basic Hygiene: Methods: Explain/Verbal Responses: State content correctly Pressure: Methods: Explain/Verbal Responses: State content correctly Safety: Methods: Explain/Verbal Responses: State content correctly Smoking and Wound Healing: Methods: Explain/Verbal Responses: State content correctly Welcome To The Wound Care Center: Methods: Explain/Verbal Responses: State content correctly Wound/Skin Impairment: Methods: Explain/Verbal Responses: State content correctly Electronic  Signature(s) Signed: 08/01/2015 3:17:41 PM By: Elpidio Eric BSN, RN Entered By: Elpidio Eric on 08/01/2015 10:00:27 [name], [name] ([id number]) -------------------------------------------------------------------------------- Wound Assessment Details Patient Name: Vary, [name]. Date of Service: 08/01/2015 8:45 AM Medical Record Number: [id number] Patient Account Number: [id number] Date of Birth/Sex: 23-Dec-1928 (82 y.o. Male) Treating RN: Clover Mealy, RN, BSN, [place] Sink Primary Care Physician: Einar Crow Other Clinician: Referring Physician: Blanchard Mane Treating Physician/Extender: Maxwell Caul Weeks in Treatment: 0 Wound Status Wound Number: 1 Primary Pressure Ulcer Etiology: Wound Location: Left Foot - Plantar Wound Open Wounding Event: Gradually Appeared Status: Date Acquired: 07/04/2015 Comorbid Cataracts, Sleep Apnea, Coronary Weeks Of Treatment: 0 History: Artery Disease, End Stage Renal Clustered Wound: No Disease, Gout  Photos Photo Uploaded By: Elpidio Eric on 08/01/2015 14:17:05 Wound Measurements Length: (cm) 2.4 Width: (cm) 2 Depth: (cm) 0.1 Area: (cm) 3.77 Volume: (cm) 0.377 % Reduction in Area: 0% % Reduction in Volume: 0% Epithelialization: None Tunneling: No Undermining: No Wound Description Classification: Unstageable/Unclassified Wound Margin: Distinct, outline attached Exudate Amount: None Present Petrosyan, [name] ([id number]) Foul Odor After Cleansing: No Wound Bed Granulation Amount: None Present (0%) Exposed Structure Necrotic Amount: Large (67-100%) Fascia Exposed: No Necrotic Quality: Eschar Fat Layer Exposed: No Tendon Exposed: No Muscle Exposed: No Joint Exposed: No Bone Exposed: No Limited to Skin Breakdown Periwound Skin Texture Texture Color No Abnormalities Noted: No No Abnormalities Noted: No Callus: No Atrophie Blanche: No Crepitus: No Cyanosis: No Excoriation: No Ecchymosis: No Fluctuance: No Erythema: No Friable:  No Hemosiderin Staining: No Induration: No Mottled: No Localized Edema: No Pallor: No Rash: No Rubor: No Scarring: No Temperature / Pain Moisture Temperature: No Abnormality No Abnormalities Noted: No Tenderness on Palpation: Yes Dry / Scaly: Yes Maceration: No Moist: Yes Wound Preparation Ulcer Cleansing: Rinsed/Irrigated with Saline Topical Anesthetic Applied: Other: lidocaine 4%, Treatment Notes Wound #1 (Left, Plantar Foot) 1. Cleansed with: Clean wound with Normal Saline 4. Dressing Applied: Santyl Ointment 5. Secondary Dressing Applied Guaze, ABD and kerlix/Conform 7. Secured with Secretary/administrator) Signed: 08/01/2015 3:17:41 PM By: Elpidio Eric BSN, RN Entered By: Elpidio Eric on 08/01/2015 09:16:17 Faw, [name] ([id number]) [place], [name] ([id number]) -------------------------------------------------------------------------------- Wound Assessment Details Patient Name: Zanni, [name]. Date of Service: 08/01/2015 8:45 AM Medical Record Number: [id number] Patient Account Number: [id number] Date of Birth/Sex: 10/11/1928 (82 y.o. Male) Treating RN: Clover Mealy, RN, BSN, [place] Primary Care Physician: Einar Crow Other Clinician: Referring Physician: Blanchard Mane Treating Physician/Extender: Maxwell Caul Weeks in Treatment: 0 Wound Status Wound Number: 2 Primary Pressure Ulcer Etiology: Wound Location: Left Malleolus - Medial Wound Open Wounding Event: Gradually Appeared Status: Date Acquired: 07/04/2015 Comorbid Cataracts, Sleep Apnea, Coronary Weeks Of Treatment: 0 History: Artery Disease, End Stage Renal Clustered Wound: No Disease, Gout Photos Photo Uploaded By: Elpidio Eric on 08/01/2015 14:18:11 Wound Measurements Length: (cm) 9.5 Width: (cm) 6 Depth: (cm) 0.3 Area: (cm) 44.768 Volume: (cm) 13.43 % Reduction in Area: 0% % Reduction in Volume: 0% Tunneling: No Undermining: No Wound Description Classification: Category/Stage  IV Wound Margin: Distinct, outline attached Exudate Amount: Medium Exudate Type: Serosanguineous Ahlquist, [name] ([id number]) Foul Odor After Cleansing: No Exudate Color: red, brown Wound Bed Granulation Amount: Small (1-33%) Exposed Structure Necrotic Amount: Large (67-100%) Fascia Exposed: No Necrotic Quality: Eschar Fat Layer Exposed: No Tendon Exposed: No Muscle Exposed: No Joint Exposed: No Bone Exposed: No Limited to Skin Breakdown Periwound Skin Texture Texture Color No Abnormalities Noted: No No Abnormalities Noted: No Callus: No Atrophie Blanche: No Crepitus: No Cyanosis: No Excoriation: No Ecchymosis: No Fluctuance: No Erythema: No Friable: No Hemosiderin Staining: No Induration: No Mottled: No Localized Edema: No Pallor: No Rash: No Rubor: No Scarring: No Temperature / Pain Moisture Temperature: No Abnormality No Abnormalities Noted: No Dry / Scaly: No Maceration: Yes Moist: Yes Wound Preparation Ulcer Cleansing: Rinsed/Irrigated with Saline Topical Anesthetic Applied: Other: lidocaine 4%, Treatment Notes Wound #2 (Left, Medial Malleolus) 1. Cleansed with: Clean wound with Normal Saline 4. Dressing Applied: Santyl Ointment 5. Secondary Dressing Applied Guaze, ABD and kerlix/Conform 7. Secured with Secretary/administrator) Signed: 08/01/2015 3:17:41 PM By: Elpidio Eric BSN, RN [place], [name] ([id number]) Entered By: Elpidio Eric on 08/01/2015 09:24:10 Alms, [name] ([id number]) -------------------------------------------------------------------------------- Wound Assessment  Details Patient Name: Gibbard, BARNES FLOREK. Date of Service: 08/01/2015 8:45 AM Medical Record Number: [id number] Patient Account Number: [id number] Date of Birth/Sex: [birth year] (82 y.o. Male) Treating RN: Clover Mealy, RN, BSN, [place] Primary Care Physician: Einar Crow Other Clinician: Referring Physician: Blanchard Mane Treating Physician/Extender: Maxwell Caul Weeks in Treatment: 0 Wound Status Wound Number: 3 Primary Pressure Ulcer Etiology: Wound Location: Left Malleolus - Lateral Wound Open Wounding Event: Gradually Appeared Status: Date Acquired: 07/04/2015 Comorbid Cataracts, Sleep Apnea, Coronary Weeks Of Treatment: 0 History: Artery Disease, End Stage Renal Clustered Wound: No Disease, Gout Photos Photo Uploaded By: Elpidio Eric on 08/01/2015 14:18:12 Wound Measurements Length: (cm) 6.7 Width: (cm) 6 Depth: (cm) 0.2 Area: (cm) 31.573 Volume: (cm) 6.315 % Reduction in Area: 0% % Reduction in Volume: 0% Epithelialization: None Tunneling: No Undermining: No Wound Description Classification: Category/Stage II Wound Margin: Distinct, outline attached Exudate Amount: Medium Exudate Type: Serosanguineous Bene, [name] ([id number]) Foul Odor After Cleansing: No Exudate Color: red, brown Wound Bed Granulation Amount: Small (1-33%) Exposed Structure Necrotic Amount: Large (67-100%) Fascia Exposed: No Necrotic Quality: Eschar, Adherent Slough Fat Layer Exposed: No Tendon Exposed: No Muscle Exposed: No Joint Exposed: No Bone Exposed: No Limited to Skin Breakdown Periwound Skin Texture Texture Color No Abnormalities Noted: No No Abnormalities Noted: No Callus: No Atrophie Blanche: No Crepitus: No Cyanosis: No Excoriation: No Ecchymosis: No Fluctuance: No Erythema: No Friable: No Hemosiderin Staining: No Induration: No Mottled: No Localized Edema: No Pallor: No Rash: No Rubor: No Scarring: No Temperature / Pain Moisture Temperature: No Abnormality No Abnormalities Noted: No Dry / Scaly: Yes Maceration: No Moist: Yes Wound Preparation Ulcer Cleansing: Rinsed/Irrigated with Saline Topical Anesthetic Applied: Other: lidocaine 4%, Treatment Notes Wound #3 (Left, Lateral Malleolus) 1. Cleansed with: Clean wound with Normal Saline 4. Dressing Applied: Santyl Ointment 5. Secondary Dressing  Applied Guaze, ABD and kerlix/Conform 7. Secured with Secretary/administrator) Signed: 08/01/2015 3:17:41 PM By: Elpidio Eric BSN, RN Riebe, [name] ([id number]) Entered By: Elpidio Eric on 08/01/2015 09:13:36 Kleiner, [name] ([id number]) -------------------------------------------------------------------------------- Vitals Details Patient Name: Heckel, [name]. Date of Service: 08/01/2015 8:45 AM Medical Record Number: [id number] Patient Account Number: [id number] Date of Birth/Sex: December 17, 1928 (82 y.o. Male) Treating RN: Clover Mealy, RN, BSN, Rita Primary Care Physician: Einar Crow Other Clinician: Referring Physician: Blanchard Mane Treating Physician/Extender: Altamese [place] in Treatment: 0 Vital Signs Time Taken: 08:56 Temperature (F): 98 Height (in): 69 Pulse (bpm): 64 Source: Stated Respiratory Rate (breaths/min): 17 Weight (lbs): 205 Blood Pressure (mmHg): 99/53 Source: Stated Reference Range: 80 - 120 mg / dl Body Mass Index (BMI): 30.3 Electronic Signature(s) Signed: 08/01/2015 3:17:41 PM By: Elpidio Eric BSN, RN Entered By: Elpidio Eric on 08/01/2015 08:59:55

## 2015-08-02 NOTE — Progress Notes (Signed)
[name] ([id number]) Visit Report for 08/01/2015 Chief Complaint Document Details Patient Name: [name], [name]. Date of Service: 08/01/2015 8:45 AM Medical Record Number: [id number] Patient Account Number: [id number] Date of Birth/Sex: [birth year] (82 y.o. Male) Treating RN: Clover Mealy, RN, BSN, [place] Sink Primary Care Physician: Einar Crow Other Clinician: Referring Physician: Blanchard Mane Treating Physician/Extender: Altamese [place] in Treatment: 0 Information Obtained from: Patient Chief Complaint Patient is here for a extensive wounds on his lateral and medial left ankle as well as the plantar first metatarsal head largely related to a cast injury Electronic Signature(s) Signed: 08/01/2015 3:38:56 PM By: Baltazar Najjar MD Entered By: Baltazar Najjar on 08/01/2015 11:59:05 Kines, Sol Blackburn ([id number]) -------------------------------------------------------------------------------- Debridement Details Patient Name: [name]. Date of Service: 08/01/2015 8:45 AM Medical Record Number: [id number] Patient Account Number: [id number] Date of Birth/Sex: 1929/04/28 (82 y.o. Male) Treating RN: Clover Mealy, RN, BSN, [place] Sink Primary Care Physician: Einar Crow Other Clinician: Referring Physician: Blanchard Mane Treating Physician/Extender: Altamese [place] in Treatment: 0 Debridement Performed for Wound #1 Left,Plantar Foot Assessment: Performed By: Physician Maxwell Caul, MD Debridement: Debridement Pre-procedure Yes Verification/Time Out Taken: Start Time: 09:49 Pain Control: Lidocaine 4% Topical Solution Level: Skin/Subcutaneous Tissue Total Area Debrided (L x 2.4 (cm) x 2 (cm) = 4.8 (cm) W): Tissue and other Non-Viable, Eschar, Exudate, Fibrin/Slough, Subcutaneous material debrided: Instrument: Blade, Curette, Forceps Bleeding: Moderate Hemostasis Achieved: Silver Nitrate End Time: 09:54 Procedural Pain: Insensate Post Procedural Pain:  Insensate Response to Treatment: Procedure was tolerated well Post Debridement Measurements of Total Wound Length: (cm) 2.4 Stage: Unstageable/Unclassified Width: (cm) 2 Depth: (cm) 0.1 Volume: (cm) 0.377 Post Procedure Diagnosis Same as Pre-procedure Electronic Signature(s) Signed: 08/01/2015 3:17:41 PM By: Elpidio Eric BSN, RN Signed: 08/01/2015 3:38:56 PM By: Baltazar Najjar MD Entered By: Baltazar Najjar on 08/01/2015 11:02:43 Hennick, Sol Blackburn ([id number]) -------------------------------------------------------------------------------- Debridement Details Patient Name: [name]. Date of Service: 08/01/2015 8:45 AM Medical Record Number: [id number] Patient Account Number: [id number] Date of Birth/Sex: July 09, 1928 (82 y.o. Male) Treating RN: Clover Mealy, RN, BSN, [place] Sink Primary Care Physician: Einar Crow Other Clinician: Referring Physician: Blanchard Mane Treating Physician/Extender: Altamese [place] in Treatment: 0 Debridement Performed for Wound #2 Left,Medial Malleolus Assessment: Performed By: Physician Maxwell Caul, MD Debridement: Debridement Pre-procedure Yes Verification/Time Out Taken: Start Time: 09:40 Pain Control: Lidocaine 4% Topical Solution Level: Skin/Subcutaneous Tissue Total Area Debrided (L x 9.5 (cm) x 6 (cm) = 57 (cm) W): Tissue and other Non-Viable, Eschar, Exudate, Fibrin/Slough, Subcutaneous material debrided: Instrument: Blade, Curette, Forceps Bleeding: Large Hemostasis Achieved: Silver Nitrate End Time: 09:49 Procedural Pain: Insensate Post Procedural Pain: Insensate Response to Treatment: Procedure was tolerated well Post Debridement Measurements of Total Wound Length: (cm) 9.5 Stage: Category/Stage IV Width: (cm) 6 Depth: (cm) 0.3 Volume: (cm) 13.43 Post Procedure Diagnosis Same as Pre-procedure Electronic Signature(s) Signed: 08/01/2015 3:17:41 PM By: Elpidio Eric BSN, RN Signed: 08/01/2015 3:38:56 PM By: Baltazar Najjar MD Entered By: Baltazar Najjar on 08/01/2015 11:02:58 Rumler, Sol Blackburn ([id number]) -------------------------------------------------------------------------------- Debridement Details Patient Name: [name]. Date of Service: 08/01/2015 8:45 AM Medical Record Number: [id number] Patient Account Number: [id number] Date of Birth/Sex: 1928/08/13 (82 y.o. Male) Treating RN: Clover Mealy, RN, BSN, [place] Sink Primary Care Physician: Einar Crow Other Clinician: Referring Physician: Blanchard Mane Treating Physician/Extender: Altamese  in Treatment: 0 Debridement Performed for Wound #3 Left,Lateral Malleolus Assessment: Performed By: Physician Maxwell Caul, MD Debridement: Debridement Pre-procedure Yes Verification/Time Out Taken: Start Time: 09:54 Pain Control: Lidocaine 4% Topical Solution  Level: Skin/Subcutaneous Tissue Total Area Debrided (L x 6.7 (cm) x 6 (cm) = 40.2 (cm) W): Tissue and other Non-Viable, Eschar, Exudate, Fibrin/Slough, Subcutaneous material debrided: Instrument: Blade, Curette, Forceps Bleeding: Moderate Hemostasis Achieved: Silver Nitrate End Time: 10:00 Procedural Pain: Insensate Post Procedural Pain: Insensate Response to Treatment: Procedure was tolerated well Post Debridement Measurements of Total Wound Length: (cm) 6.7 Stage: Category/Stage II Width: (cm) 6 Depth: (cm) 0.2 Volume: (cm) 6.315 Post Procedure Diagnosis Same as Pre-procedure Electronic Signature(s) Signed: 08/01/2015 3:17:41 PM By: Elpidio Eric BSN, RN Signed: 08/01/2015 3:38:56 PM By: Baltazar Najjar MD Entered By: Baltazar Najjar on 08/01/2015 11:03:17 Detzel, Sol Blackburn ([id number]) -------------------------------------------------------------------------------- HPI Details Patient Name: [name]. Date of Service: 08/01/2015 8:45 AM Medical Record Number: [id number] Patient Account Number: [id number] Date of Birth/Sex: 16-Oct-1928 (82 y.o. Male) Treating RN:  Clover Mealy, RN, BSN, [place] Sink Primary Care Physician: Einar Crow Other Clinician: Referring Physician: Blanchard Mane Treating Physician/Extender: Altamese [place] in Treatment: 0 History of Present Illness HPI Description: 08/01/15; this is a patient who had a trimalleolar ankle fracture on the left in early December. He underwent ORIF and had a cast placed for 6 weeks. Apparently there was nothing noted to be wrong until the cast was removed and he was noted to have extensive injury to the medial aspect of his left ankle and foot and the lateral aspect of the ankle to a lesser extent. He was also noted by the staff on arrival here to have a necrotic area over the left first metatarsal head. He has been in a nursing home for rehabilitation at [facility]. Had been using Santyl to the medial foot and ankle but not the lateral aspect of period and there has been no specific treatment to the left first metatarsal head. The patient has chronic renal failure on dialysis for 7 years but he is not a diabetic. He has no known arterial problems to his lower legs. He has no prior wound issues. Electronic Signature(s) Signed: 08/01/2015 3:38:56 PM By: Baltazar Najjar MD Entered By: Baltazar Najjar on 08/01/2015 12:00:13 Holifield, Sol Blackburn ([id number]) -------------------------------------------------------------------------------- Physical Exam Details Patient Name: Fulco, Sol Blackburn. Date of Service: 08/01/2015 8:45 AM Medical Record Number: [id number] Patient Account Number: [id number] Date of Birth/Sex: December 16, 1928 (82 y.o. Male) Treating RN: Clover Mealy, RN, BSN, [place] Sink Primary Care Physician: Einar Crow Other Clinician: Referring Physician: Blanchard Mane Treating Physician/Extender: Maxwell Caul [place] in Treatment: 0 Constitutional .... Respiratory Respiratory effort is normal. Air entry is clear. Cardiovascular Heart sounds are normal, he appears to be euvolemic. Patient does not have  palpable pulses at the dorsalis pedis, posterior tibial, popliteal. The patient has evidence of chronic venous insufficiency in his bilateral legs. Gastrointestinal (GI) No masses are noted. Liver and spleen are not palpable. Notes Wound exam; the patient has extensive necrotic wounds over the medial aspect of the left ankle and foot. These all required extensive surgical debridement however he tends to bleed fairly freely and subsequent debridement surgery going to have to be done here. He also has wounds on the lateral aspect of the ankle which are smaller but also required necrotic tissue removal. Finally he had a large callus over the first metatarsal head which was removed and again there was deep necrotic tissue here but it did not reach bone Electronic Signature(s) Signed: 08/01/2015 3:38:56 PM By: Baltazar Najjar MD Entered By: Baltazar Najjar on 08/01/2015 12:05:11 Dralle, Sol Blackburn ([id number]) -------------------------------------------------------------------------------- Physician Orders Details Patient Name: Burdo, Sol Blackburn. Date of Service: 08/01/2015  8:45 AM Medical Record Number: [id number] Patient Account Number: [id number] Date of Birth/Sex: [birth year] (82 y.o. Male) Treating RN: Clover Mealy, RN, BSN, [place] Sink Primary Care Physician: Einar Crow Other Clinician: Referring Physician: Blanchard Mane Treating Physician/Extender: Altamese [place] in Treatment: 0 Verbal / Phone Orders: Yes Clinician: Afful, RN, BSN, Rita Read Back and Verified: Yes Diagnosis Coding Wound Cleansing Wound #1 Left,Plantar Foot o Clean wound with Normal Saline. Wound #2 Left,Medial Malleolus o Clean wound with Normal Saline. Wound #3 Left,Lateral Malleolus o Clean wound with Normal Saline. Anesthetic Wound #1 Left,Plantar Foot o Topical Lidocaine 4% cream applied to wound bed prior to debridement Wound #2 Left,Medial Malleolus o Topical Lidocaine 4% cream applied to wound bed  prior to debridement Wound #3 Left,Lateral Malleolus o Topical Lidocaine 4% cream applied to wound bed prior to debridement Primary Wound Dressing Wound #1 Left,Plantar Foot o Santyl Ointment Wound #2 Left,Medial Malleolus o Santyl Ointment Wound #3 Left,Lateral Malleolus o Santyl Ointment Secondary Dressing Wound #1 Left,Plantar Foot o Gauze, ABD and Kerlix/Conform Wound #2 Left,Medial Malleolus o Gauze, ABD and Kerlix/Conform Moxon, Sol Blackburn ([id number]) Wound #3 Left,Lateral Malleolus o Gauze, ABD and Kerlix/Conform Dressing Change Frequency Wound #1 Left,Plantar Foot o Change dressing every day. Wound #2 Left,Medial Malleolus o Change dressing every day. Wound #3 Left,Lateral Malleolus o Change dressing every day. Follow-up Appointments Wound #1 Left,Plantar Foot o Return Appointment in 1 week. Wound #2 Left,Medial Malleolus o Return Appointment in 1 week. Wound #3 Left,Lateral Malleolus o Return Appointment in 1 week. Off-Loading Wound #1 Left,Plantar Foot o Other: - DH walking boot Wound #2 Left,Medial Malleolus o Other: - DH walking boot Wound #3 Left,Lateral Malleolus o Other: - DH walking boot Electronic Signature(s) Signed: 08/01/2015 3:17:41 PM By: Elpidio Eric BSN, RN Signed: 08/01/2015 3:38:56 PM By: Baltazar Najjar MD Entered By: Elpidio Eric on 08/01/2015 09:54:00 Elmquist, Sol Blackburn ([id number]) -------------------------------------------------------------------------------- Problem List Details Patient Name: Villalpando, Sol Blackburn. Date of Service: 08/01/2015 8:45 AM Medical Record Number: [id number] Patient Account Number: [id number] Date of Birth/Sex: Feb 12, 1929 (82 y.o. 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 Date of Service: 08/01/2015 8:45 AM Medical Record Number: [id number] Patient Account Number: [id number] Date of Birth/Sex: [birth year] (82 y.o. Male) Treating RN: Clover Mealy, RN, BSN, [place] Sink Primary Care Physician: Einar Crow Other Clinician: Referring Physician: Blanchard Mane Treating Physician/Extender: Altamese [place] in Treatment: 0 Subjective Chief Complaint Information obtained from Patient Patient is here for a extensive wounds on his lateral and medial left ankle as well as the plantar first metatarsal head largely related to a cast injury History of Present Illness (HPI) 08/01/15; this is a patient who had a trimalleolar ankle fracture on the left in early December. He underwent ORIF and had a cast placed for 6 weeks. Apparently there was nothing noted to be wrong until the cast was removed and he was noted to have extensive injury to the medial aspect of his  left ankle and foot and the lateral aspect of the ankle to a lesser extent. He was also noted by the staff on arrival here to have a necrotic area over the left first metatarsal head. He has been in a nursing home for rehabilitation at [facility]. Had been using Santyl to the medial foot and ankle but not the lateral aspect of period and there has been no specific treatment to the left first metatarsal head. The patient has chronic  renal failure on dialysis for 7 years but he is not a diabetic. He has no known arterial problems to his lower legs. He has no prior wound issues. Wound History Patient presents with 1 open wound. Patient has been treating wound in the following manner: dry dressing. Laboratory tests have not been performed in the last month. Patient reportedly has not tested positive for an antibiotic resistant organism. Patient reportedly has not tested positive for osteomyelitis. Patient reportedly has not had testing performed to evaluate circulation in the legs. Patient experiences the following problems associated with their wounds: infection, swelling. Patient History Information obtained from Patient, Caregiver, Chart. Allergies Penicillins Family History Heart Disease - Father, Hypertension - Mother, No family history of Cancer, Diabetes, Hereditary Spherocytosis, Kidney Disease, Lung Disease, Seizures, Stroke, Thyroid Problems, Tuberculosis. Social History Former smoker, Marital Status - Married, Alcohol Use - Rarely, Drug Use - No History, Caffeine Use - Daily Pitcher, RANVIR RENOVATO ([id number]) - tea. Medical History Eyes Patient has history of Cataracts Ear/Nose/Mouth/Throat Denies history of Chronic sinus problems/congestion, Middle ear problems Hematologic/Lymphatic Denies history of Anemia, Hemophilia, Human Immunodeficiency Virus, Lymphedema, Sickle Cell Disease Respiratory Patient has history of Sleep Apnea Cardiovascular Patient has history of Coronary Artery Disease Gastrointestinal Denies history of Cirrhosis , Colitis, Crohn s, Hepatitis A, Hepatitis B, Hepatitis C Endocrine Denies history of Type I Diabetes, Type II Diabetes Genitourinary Patient has history of End Stage Renal Disease - on dialysis Immunological Denies history of Lupus Erythematosus, Raynaud s, Scleroderma Integumentary (Skin) Denies history of History of Burn, History of pressure  wounds Musculoskeletal Patient has history of Gout Neurologic Denies history of Dementia, Neuropathy, Quadriplegia, Paraplegia, Seizure Disorder Oncologic Denies history of Received Chemotherapy, Received Radiation Psychiatric Denies history of Anorexia/bulimia, Confinement Anxiety Hospitalization/Surgery History - cholecystectomy. - cardiac stent. - cardiac stents. - coronary angioplasty. - ORIF. - insert pacemaker. Medical And Surgical History Notes Cardiovascular hyperlipedemia, cardiomyopathy, syncope and collapse Neurologic parkinsons Review of Systems (ROS) Constitutional Symptoms (General Health) The patient has no complaints or symptoms. Eyes Complains or has symptoms of Glasses / Contacts. Denies complaints or symptoms of Dry Eyes. Ear/Nose/Mouth/Throat The patient has no complaints or symptoms. Hematologic/Lymphatic The patient has no complaints or symptoms. Flamenco, Sol Blackburn ([id number]) Respiratory The patient has no complaints or symptoms. Gastrointestinal The patient has no complaints or symptoms. Endocrine The patient has no complaints or symptoms. Genitourinary Complains or has symptoms of Kidney failure/ Dialysis. Immunological The patient has no complaints or symptoms. Integumentary (Skin) Complains or has symptoms of Wounds. Musculoskeletal The patient has no complaints or symptoms. Neurologic The patient has no complaints or symptoms. Oncologic The patient has no complaints or symptoms. Psychiatric The patient has no complaints or symptoms. Objective Constitutional Vitals Time Taken: 8:56 AM, Height: 69 in, Source: Stated, Weight: 205 lbs, Source: Stated, BMI: 30.3, Temperature: 98 F, Pulse: 64 bpm, Respiratory Rate: 17 breaths/min, Blood Pressure: 99/53 mmHg. Respiratory Respiratory effort is normal. Air entry is clear. Cardiovascular Heart sounds are normal, he appears to be euvolemic. Patient does  not have palpable pulses at the dorsalis  pedis, posterior tibial, popliteal. The patient has evidence of chronic venous insufficiency in his bilateral legs. Gastrointestinal (GI) No masses are noted. Liver and spleen are not palpable. General Notes: Wound exam; the patient has extensive necrotic wounds over the medial aspect of the left ankle and foot. These all required extensive surgical debridement however he tends to bleed fairly freely and subsequent debridement surgery going to have to be done here. He also has wounds on the lateral aspect of the ankle which are smaller but also required necrotic tissue removal. Finally he had a large callus over the first metatarsal head which was removed and again there was deep necrotic tissue here but it did Jakes, Sol Blackburn ([id number]) not reach bone Integumentary (Hair, Skin) Wound #1 status is Open. Original cause of wound was Gradually Appeared. The wound is located on the Left,Plantar Foot. The wound measures 2.4cm length x 2cm width x 0.1cm depth; 3.77cm^2 area and 0.377cm^3 volume. The wound is limited to skin breakdown. There is no tunneling or undermining noted. There is a none present amount of drainage noted. The wound margin is distinct with the outline attached to the wound base. There is no granulation within the wound bed. There is a large (67-100%) amount of necrotic tissue within the wound bed including Eschar. The periwound skin appearance exhibited: Dry/Scaly, Moist. The periwound skin appearance did not exhibit: Callus, Crepitus, Excoriation, Fluctuance, Friable, Induration, Localized Edema, Rash, Scarring, Maceration, Atrophie Blanche, Cyanosis, Ecchymosis, Hemosiderin Staining, Mottled, Pallor, Rubor, Erythema. Periwound temperature was noted as No Abnormality. The periwound has tenderness on palpation. Wound #2 status is Open. Original cause of wound was Gradually Appeared. The wound is located on the Left,Medial Malleolus. The wound measures 9.5cm length x 6cm width  x 0.3cm depth; 44.768cm^2 area and 13.43cm^3 volume. The wound is limited to skin breakdown. There is no tunneling or undermining noted. There is a medium amount of serosanguineous drainage noted. The wound margin is distinct with the outline attached to the wound base. There is small (1-33%) granulation within the wound bed. There is a large (67-100%) amount of necrotic tissue within the wound bed including Eschar. The periwound skin appearance exhibited: Maceration, Moist. The periwound skin appearance did not exhibit: Callus, Crepitus, Excoriation, Fluctuance, Friable, Induration, Localized Edema, Rash, Scarring, Dry/Scaly, Atrophie Blanche, Cyanosis, Ecchymosis, Hemosiderin Staining, Mottled, Pallor, Rubor, Erythema. Periwound temperature was noted as No Abnormality. Wound #3 status is Open. Original cause of wound was Gradually Appeared. The wound is located on the Left,Lateral Malleolus. The wound measures 6.7cm length x 6cm width x 0.2cm depth; 31.573cm^2 area and 6.315cm^3 volume. The wound is limited to skin breakdown. There is no tunneling or undermining noted. There is a medium amount of serosanguineous drainage noted. The wound margin is distinct with the outline attached to the wound base. There is small (1-33%) granulation within the wound bed. There is a large (67-100%) amount of necrotic tissue within the wound bed including Eschar and Adherent Slough. The periwound skin appearance exhibited: Dry/Scaly, Moist. The periwound skin appearance did not exhibit: Callus, Crepitus, Excoriation, Fluctuance, Friable, Induration, Localized Edema, Rash, Scarring, Maceration, Atrophie Blanche, Cyanosis, Ecchymosis, Hemosiderin Staining, Mottled, Pallor, Rubor, Erythema. Periwound temperature was noted as No Abnormality. Assessment Active Problems ICD-10 L97.312 - Non-pressure chronic ulcer of right ankle with fat layer exposed S82.852S - Displaced trimalleolar fracture of left lower leg,  sequela N18.6 - End stage renal disease G20 - Parkinson's disease Nicolaisen, Sol Blackburn ([id number]) Procedures  Wound #1 Wound #1 is a Pressure Ulcer located on the Left,Plantar Foot . There was a Skin/Subcutaneous Tissue Debridement ([id number]) debridement with total area of 4.8 sq cm performed by Maxwell Caul, MD. with the following instrument(s): Blade, Curette, and Forceps to remove Non-Viable tissue/material including Exudate, Fibrin/Slough, Eschar, and Subcutaneous after achieving pain control using Lidocaine 4% Topical Solution. A time out was conducted prior to the start of the procedure. A Moderate amount of bleeding was controlled with Silver Nitrate. The procedure was tolerated well with a pain level of Insensate throughout and a pain level of Insensate following the procedure. Post Debridement Measurements: 2.4cm length x 2cm width x 0.1cm depth; 0.377cm^3 volume. Post debridement Stage noted as Unstageable/Unclassified. Post procedure Diagnosis Wound #1: Same as Pre-Procedure Wound #2 Wound #2 is a Pressure Ulcer located on the Left,Medial Malleolus . There was a Skin/Subcutaneous Tissue Debridement ([id number]) debridement with total area of 57 sq cm performed by Maxwell Caul, MD. with the following instrument(s): Blade, Curette, and Forceps to remove Non-Viable tissue/material including Exudate, Fibrin/Slough, Eschar, and Subcutaneous after achieving pain control using Lidocaine 4% Topical Solution. A time out was conducted prior to the start of the procedure. A Large amount of bleeding was controlled with Silver Nitrate. The procedure was tolerated well with a pain level of Insensate throughout and a pain level of Insensate following the procedure. Post Debridement Measurements: 9.5cm length x 6cm width x 0.3cm depth; 13.43cm^3 volume. Post debridement Stage noted as Category/Stage IV. Post procedure Diagnosis Wound #2: Same as Pre-Procedure Wound #3 Wound #3 is a  Pressure Ulcer located on the Left,Lateral Malleolus . There was a Skin/Subcutaneous Tissue Debridement ([id number]) debridement with total area of 40.2 sq cm performed by Maxwell Caul, MD. with the following instrument(s): Blade, Curette, and Forceps to remove Non-Viable tissue/material including Exudate, Fibrin/Slough, Eschar, and Subcutaneous after achieving pain control using Lidocaine 4% Topical Solution. A time out was conducted prior to the start of the procedure. A Moderate amount of bleeding was controlled with Silver Nitrate. The procedure was tolerated well with a pain level of Insensate throughout and a pain level of Insensate following the procedure. Post Debridement Measurements: 6.7cm length x 6cm width x 0.2cm depth; 6.315cm^3 volume. Post debridement Stage noted as Category/Stage II. Post procedure Diagnosis Wound #3: Same as Pre-Procedure Plan Beidleman, LEOVARDO THOMAN ([id number]) Wound Cleansing: Wound #1 Left,Plantar Foot: Clean wound with Normal Saline. Wound #2 Left,Medial Malleolus: Clean wound with Normal Saline. Wound #3 Left,Lateral Malleolus: Clean wound with Normal Saline. Anesthetic: Wound #1 Left,Plantar Foot: Topical Lidocaine 4% cream applied to wound bed prior to debridement Wound #2 Left,Medial Malleolus: Topical Lidocaine 4% cream applied to wound bed prior to debridement Wound #3 Left,Lateral Malleolus: Topical Lidocaine 4% cream applied to wound bed prior to debridement Primary Wound Dressing: Wound #1 Left,Plantar Foot: Santyl Ointment Wound #2 Left,Medial Malleolus: Santyl Ointment Wound #3 Left,Lateral Malleolus: Santyl Ointment Secondary Dressing: Wound #1 Left,Plantar Foot: Gauze, ABD and Kerlix/Conform Wound #2 Left,Medial Malleolus: Gauze, ABD and Kerlix/Conform Wound #3 Left,Lateral Malleolus: Gauze, ABD and Kerlix/Conform Dressing Change Frequency: Wound #1 Left,Plantar Foot: Change dressing every day. Wound #2 Left,Medial  Malleolus: Change dressing every day. Wound #3 Left,Lateral Malleolus: Change dressing every day. Follow-up Appointments: Wound #1 Left,Plantar Foot: Return Appointment in 1 week. Wound #2 Left,Medial Malleolus: Return Appointment in 1 week. Wound #3 Left,Lateral Malleolus: Return Appointment in 1 week. Off-Loading: Wound #1 Left,Plantar Foot: Other: - DH walking boot Wound #2 Left,Medial Malleolus: Other: - DH  walking boot Wound #3 Left,Lateral Malleolus: Other: - DH walking boot Parzych, Sol Blackburn ([id number]) #1 we will continue Santyl to all of these wound areas, unfortunately they're going to require further aggressive debridement #2 ABG and Kerlix. #3 I could not feel his peripheral pulses so he will require formal arterial Dopplers. His ABI in this clinic was noncompressible. #4 I suspect this is cast injury, although I'm not completely sure about this. If there is a history of wounds before his surgery or after and before the cast was applied I do not have this information Electronic Signature(s) Signed: 08/01/2015 3:38:56 PM By: Baltazar Najjar MD Entered By: Baltazar Najjar on 08/01/2015 12:07:35 Cockrell, Sol Blackburn ([id number]) -------------------------------------------------------------------------------- ROS/PFSH Details Patient Name: Becraft, Sol Blackburn. Date of Service: 08/01/2015 8:45 AM Medical Record Number: [id number] Patient Account Number: [id number] Date of Birth/Sex: 06-01-1929 (82 y.o. Male) Treating RN: Clover Mealy, RN, BSN, Rita Primary Care Physician: Einar Crow Other Clinician: Referring Physician: Blanchard Mane Treating Physician/Extender: Altamese [place] in Treatment: 0 Information Obtained From Patient Caregiver Chart Wound History Do you currently have one or more open woundso Yes How many open wounds do you currently haveo 1 How have you been treating your wound(s) until nowo dry dressing Has your wound(s) ever healed and then re-openedo  No Have you had any lab work done in the past montho No Have you tested positive for an antibiotic resistant organism (MRSA, VRE)o No Have you tested positive for osteomyelitis (bone infection)o No Have you had any tests for circulation on your legso No Have you had other problems associated with your woundso Infection, Swelling Eyes Complaints and Symptoms: Positive for: Glasses / Contacts Negative for: Dry Eyes Medical History: Positive for: Cataracts Genitourinary Complaints and Symptoms: Positive for: Kidney failure/ Dialysis Medical History: Positive for: End Stage Renal Disease - on dialysis Integumentary (Skin) Complaints and Symptoms: Positive for: Wounds Medical History: Negative for: History of Burn; History of pressure wounds Constitutional Symptoms (General Health) Spinola, HAYWARD RYLANDER ([id number]) Complaints and Symptoms: No Complaints or Symptoms Ear/Nose/Mouth/Throat Complaints and Symptoms: No Complaints or Symptoms Medical History: Negative for: Chronic sinus problems/congestion; Middle ear problems Hematologic/Lymphatic Complaints and Symptoms: No Complaints or Symptoms Medical History: Negative for: Anemia; Hemophilia; Human Immunodeficiency Virus; Lymphedema; Sickle Cell Disease Respiratory Complaints and Symptoms: No Complaints or Symptoms Medical History: Positive for: Sleep Apnea Cardiovascular Medical History: Positive for: Coronary Artery Disease Past Medical History Notes: hyperlipedemia, cardiomyopathy, syncope and collapse Gastrointestinal Complaints and Symptoms: No Complaints or Symptoms Medical History: Negative for: Cirrhosis ; Colitis; Crohnos; Hepatitis A; Hepatitis B; Hepatitis C Endocrine Complaints and Symptoms: No Complaints or Symptoms Medical History: Negative for: Type I Diabetes; Type II Diabetes Immunological Wheeling, DYLEN MCELHANNON ([id number]) Complaints and Symptoms: No Complaints or Symptoms Medical History: Negative for:  Lupus Erythematosus; Raynaudos; Scleroderma Musculoskeletal Complaints and Symptoms: No Complaints or Symptoms Medical History: Positive for: Gout Neurologic Complaints and Symptoms: No Complaints or Symptoms Medical History: Negative for: Dementia; Neuropathy; Quadriplegia; Paraplegia; Seizure Disorder Past Medical History Notes: parkinsons Oncologic Complaints and Symptoms: No Complaints or Symptoms Medical History: Negative for: Received Chemotherapy; Received Radiation Psychiatric Complaints and Symptoms: No Complaints or Symptoms Medical History: Negative for: Anorexia/bulimia; Confinement Anxiety HBO Extended History Items Eyes: Cataracts Hospitalization / Surgery History Name of Hospital Purpose of Hospitalization/Surgery Date cholecystectomy cardiac stent cardiac stents coronary angioplasty Herendeen, JALIEL DEAVERS ([id number]) ORIF insert pacemaker Family and Social History Cancer: No; Diabetes: No; Heart Disease: Yes - Father; Hereditary Spherocytosis: No; Hypertension: Yes - Mother; Kidney Disease:  No; Lung Disease: No; Seizures: No; Stroke: No; Thyroid Problems: No; Tuberculosis: No; Former smoker; Marital Status - Married; Alcohol Use: Rarely; Drug Use: No History; Caffeine Use: Daily - tea; Financial Concerns: No; Food, Clothing or Shelter Needs: No; Support System Lacking: No; Transportation Concerns: No; Advanced Directives: No; Living Will: No Electronic Signature(s) Signed: 08/01/2015 3:17:41 PM By: Elpidio Eric BSN, RN Signed: 08/01/2015 3:38:56 PM By: Baltazar Najjar MD Entered By: Elpidio Eric on 08/01/2015 08:56:22 Bekele, Sol Blackburn ([id number]) -------------------------------------------------------------------------------- SuperBill Details Patient Name: Bangerter, Sol Blackburn. Date of Service: 08/01/2015 Medical Record Number: [id number] Patient Account Number: [id number] Date of Birth/Sex: 1929-01-14 (82 y.o. Male) Treating RN: Clover Mealy, RN, BSN, [place] Sink Primary Care  Physician: Einar Crow Other Clinician: Referring Physician: Blanchard Mane Treating Physician/Extender: Altamese [place] in Treatment: 0 Diagnosis Coding ICD-10 Codes Code Description [phone] Non-pressure chronic ulcer of right ankle with fat layer exposed S82.852S Displaced trimalleolar fracture of left lower leg, sequela N18.6 End stage renal disease G20 Parkinson's disease Facility Procedures CPT4 Code Description: [id number] 99214 - WOUND CARE VISIT-LEV 4 EST PT Modifier: Quantity: 1 CPT4 Code Description: [id number] 11042 - DEB SUBQ TISSUE 20 SQ CM/< ICD-10 Description Diagnosis L97.312 Non-pressure chronic ulcer of right ankle with fat Modifier: layer expose Quantity: 1 d CPT4 Code Description: [id number] 11045 - DEB SUBQ TISS EA ADDL 20CM ICD-10 Description Diagnosis L97.312 Non-pressure chronic ulcer of right ankle with fat Modifier: layer expose Quantity: 5 d Physician Procedures CPT4 Code Description: [id number] 99204 - WC PHYS LEVEL 4 - NEW PT ICD-10 Description Diagnosis L97.312 Non-pressure chronic ulcer of right ankle with fat Modifier: layer expose Quantity: 1 d CPT4 Code Description: [id number] 11042 - WC PHYS SUBQ TISS 20 SQ CM ICD-10 Description Diagnosis L97.312 Non-pressure chronic ulcer of right ankle with fat Modifier: layer expose Quantity: 1 d CPT4 Code Description: 8756433 11045 - WC PHYS SUBQ TISS EA ADDL 20 CM Ou, ALEXIS MIZUNO ([id number]) Modifier: Quantity: 5 Electronic Signature(s) Signed: 08/01/2015 3:38:56 PM By: Baltazar Najjar MD Entered By: Baltazar Najjar on 08/01/2015 12:08:28

## 2015-08-02 NOTE — Progress Notes (Signed)
Olsen, SEKOU ZUCKERMAN ([id number]) Visit Report for 08/01/2015 Abuse/Suicide Risk Screen Details Patient Name: [name]. Date of Service: 08/01/2015 8:45 AM Medical Record Number: [id number] Patient Account Number: [id number] Date of Birth/Sex: 10-07-1928 (82 y.o. Male) Treating RN: Clover Mealy, RN, BSN, [place] Sink Primary Care Physician: Einar Crow Other Clinician: Referring Physician: Blanchard Mane Treating Physician/Extender: Maxwell Caul Weeks in Treatment: 0 Abuse/Suicide Risk Screen Items Answer ABUSE/SUICIDE RISK SCREEN: Has anyone close to you tried to hurt or harm you recentlyo No Do you feel uncomfortable with anyone in your familyo No Has anyone forced you do things that you didnot want to doo No Do you have any thoughts of harming yourselfo No Patient displays signs or symptoms of abuse and/or neglect. No Electronic Signature(s) Signed: 08/01/2015 3:17:41 PM By: Elpidio Eric BSN, RN Entered By: Elpidio Eric on 08/01/2015 08:52:09 Goodenow, [name] ([id number]) -------------------------------------------------------------------------------- Activities of Daily Living Details Patient Name: [name]. Date of Service: 08/01/2015 8:45 AM Medical Record Number: [id number] Patient Account Number: [id number] Date of Birth/Sex: [birth year] (82 y.o. Male) Treating RN: Clover Mealy, RN, BSN, [place] Sink Primary Care Physician: Einar Crow Other Clinician: Referring Physician: Blanchard Mane Treating Physician/Extender: Maxwell Caul [place] in Treatment: 0 Activities of Daily Living Items Answer Activities of Daily Living (Please select one for each item) Drive Automobile Not Able Take Medications Not Able Use Telephone Need Assistance Care for Appearance Need Assistance Use Toilet Need Assistance Bath / Shower Need Assistance Dress Self Need Assistance Feed Self Need Assistance Walk Not Able Get In / Out Bed Need Assistance Housework Need Assistance Prepare Meals Not Able Handle  Money Not Able Shop for Self Not Able Electronic Signature(s) Signed: 08/01/2015 3:17:41 PM By: Elpidio Eric BSN, RN Entered By: Elpidio Eric on 08/01/2015 08:52:40 Condron, [name] ([id number]) -------------------------------------------------------------------------------- Education Assessment Details Patient Name: [name]. Date of Service: 08/01/2015 8:45 AM Medical Record Number: [id number] Patient Account Number: [id number] Date of Birth/Sex: 10/13/1928 (82 y.o. Male) Treating RN: Clover Mealy, RN, BSN, [place] Sink Primary Care Physician: Einar Crow Other Clinician: Referring Physician: Blanchard Mane Treating Physician/Extender: Altamese [place] in Treatment: 0 Primary Learner Assessed: Patient Learning Preferences/Education Level/Primary Language Learning Preference: Explanation Highest Education Level: High School Preferred Language: English Cognitive Barrier Assessment/Beliefs Language Barrier: No Physical Barrier Assessment Impaired Vision: No Impaired Hearing: Yes Decreased Hand dexterity: No Knowledge/Comprehension Assessment Knowledge Level: Medium Comprehension Level: Medium Ability to understand written Medium instructions: Ability to understand verbal Medium instructions: Motivation Assessment Anxiety Level: Calm Cooperation: Cooperative Education Importance: Acknowledges Need Interest in Health Problems: Asks Questions Willingness to Engage in Self- Medium Management Activities: Readiness to Engage in Self- Medium Management Activities: Electronic Signature(s) Signed: 08/01/2015 3:17:41 PM By: Elpidio Eric BSN, RN Entered By: Elpidio Eric on 08/01/2015 08:53:13 Colee, [name] ([id number]) -------------------------------------------------------------------------------- Fall Risk Assessment Details Patient Name: [name]. Date of Service: 08/01/2015 8:45 AM Medical Record Number: [id number] Patient Account Number: [id number] Date of Birth/Sex:  [birth year] (82 y.o. Male) Treating RN: Clover Mealy, RN, BSN, Rita Primary Care Physician: Einar Crow Other Clinician: Referring Physician: Blanchard Mane Treating Physician/Extender: Altamese [place] in Treatment: 0 Fall Risk Assessment Items Have you had 2 or more falls in the last 12 monthso 0 Yes Have you had any fall that resulted in injury in the last 12 monthso 0 Yes FALL RISK ASSESSMENT: History of falling - immediate or within 3 months 0 No Secondary diagnosis 0 No Ambulatory aid None/bed rest/wheelchair/nurse 0 Yes Crutches/cane/walker 0 No Furniture 0 No IV Access/Saline Lock  0 No Gait/Training Normal/bed rest/immobile 0 Yes Weak 10 Yes Impaired 20 Yes Mental Status Oriented to own ability 0 Yes Electronic Signature(s) Signed: 08/01/2015 3:17:41 PM By: Elpidio Eric BSN, RN Entered By: Elpidio Eric on 08/01/2015 08:53:39 Steib, [name] ([id number]) -------------------------------------------------------------------------------- Foot Assessment Details Patient Name: [name]. Date of Service: 08/01/2015 8:45 AM Medical Record Number: [id number] Patient Account Number: [id number] Date of Birth/Sex: March 28, 1929 (82 y.o. Male) Treating RN: Clover Mealy, RN, BSN, [place] Sink Primary Care Physician: Einar Crow Other Clinician: Referring Physician: Blanchard Mane Treating Physician/Extender: Maxwell Caul [place] in Treatment: 0 Foot Assessment Items Site Locations + = Sensation present, - = Sensation absent, C = Callus, U = Ulcer R = Redness, W = Warmth, M = Maceration, PU = Pre-ulcerative lesion F = Fissure, S = Swelling, D = Dryness Assessment Right: Left: Other Deformity: No No Prior Foot Ulcer: No No Prior Amputation: No No Charcot Joint: No No Ambulatory Status: Non-ambulatory Assistance Device: Wheelchair Gait: Surveyor, mining) Signed: 08/01/2015 3:17:41 PM By: Elpidio Eric BSN, RN Entered By: Elpidio Eric on 08/01/2015 08:54:16 [name],  [name] ([id number]) -------------------------------------------------------------------------------- Nutrition Risk Assessment Details Patient Name: Sprecher, [name]. Date of Service: 08/01/2015 8:45 AM Medical Record Number: [id number] Patient Account Number: [id number] Date of Birth/Sex: [birth year] (82 y.o. Male) Treating RN: Clover Mealy, RN, BSN, [place] Sink Primary Care Physician: Einar Crow Other Clinician: Referring Physician: Blanchard Mane Treating Physician/Extender: Maxwell Caul Weeks in Treatment: 0 Height (in): Weight (lbs): Body Mass Index (BMI): Nutrition Risk Assessment Items NUTRITION RISK SCREEN: I have an illness or condition that made me change the kind and/or 0 No amount of food I eat I eat fewer than two meals per day 0 No I eat few fruits and vegetables, or milk products 0 No I have three or more drinks of beer, liquor or wine almost every day 2 Yes I have tooth or mouth problems that make it hard for me to eat 0 No I don't always have enough money to buy the food I need 0 No I eat alone most of the time 0 No I take three or more different prescribed or over-the-counter drugs a 0 No day Without wanting to, I have lost or gained 10 pounds in the last six 0 No months I am not always physically able to shop, cook and/or feed myself 0 No Nutrition Protocols Good Risk Protocol 0 No interventions needed Moderate Risk Protocol Electronic Signature(s) Signed: 08/01/2015 3:17:41 PM By: Elpidio Eric BSN, RN Entered By: Elpidio Eric on 08/01/2015 08:54:08

## 2015-08-08 ENCOUNTER — Encounter: Payer: Medicare Other | Admitting: Internal Medicine

## 2015-08-08 DIAGNOSIS — L97312 Non-pressure chronic ulcer of right ankle with fat layer exposed: Secondary | ICD-10-CM | POA: Diagnosis not present

## 2015-08-09 ENCOUNTER — Other Ambulatory Visit: Payer: Self-pay | Admitting: Vascular Surgery

## 2015-08-10 NOTE — Progress Notes (Addendum)
Geffert, JASMAN MURRI ([id number]) Visit Report for 08/08/2015 Arrival Information Details Patient Name: [name], [name]. Date of Service: 08/08/2015 10:45 AM Medical Record Number: [id number] Patient Account Number: [id number] Date of Birth/Sex: Mar 11, 1929 (82 y.o. Male) Treating RN: Clover Mealy, RN, BSN, [place] Sink Primary Care Physician: Einar Crow Other Clinician: Referring Physician: Einar Crow Treating Physician/Extender: Altamese [place] in Treatment: 1 Visit Information History Since Last Visit Added or deleted any medications: No Patient Arrived: Wheel Chair Any new allergies or adverse reactions: No Arrival Time: 10:32 Had a fall or experienced change in No Accompanied By: son activities of daily living that may affect Transfer Assistance: EasyPivot risk of falls: Patient Lift Signs or symptoms of abuse/neglect since last No Patient Identification Verified: Yes visito Secondary Verification Process Yes Hospitalized since last visit: No Completed: Has Dressing in Place as Prescribed: Yes Patient Requires Transmission- No Pain Present Now: No Based Precautions: Patient Has Alerts: No Electronic Signature(s) Signed: 08/08/2015 4:45:24 PM By: Elpidio Eric BSN, RN Entered By: Elpidio Eric on 08/08/2015 10:32:49 [name], [name] ([id number]) -------------------------------------------------------------------------------- Encounter Discharge Information Details Patient Name: [name], [name]. Date of Service: 08/08/2015 10:45 AM Medical Record Number: [id number] Patient Account Number: [id number] Date of Birth/Sex: 1928-07-02 (82 y.o. Male) Treating RN: Clover Mealy, RN, BSN, [place] Sink Primary Care Physician: Einar Crow Other Clinician: Referring Physician: Einar Crow Treating Physician/Extender: Altamese [place] in Treatment: 1 Encounter Discharge Information Items Schedule Follow-up Appointment: No Medication Reconciliation completed No and provided to  Patient/Care Kayce Betty: Provided on Clinical Summary of Care: 08/08/2015 Form Type Recipient Paper Patient [facility] Electronic Signature(s) Signed: 08/08/2015 11:37:53 AM By: Gwenlyn Perking Entered By: Gwenlyn Perking on 08/08/2015 11:37:53 [name], [name] ([id number]) -------------------------------------------------------------------------------- General Visit Notes Details Patient Name: [name], [name]. Date of Service: 08/08/2015 10:45 AM Medical Record Number: [id number] Patient Account Number: [id number] Date of Birth/Sex: July 03, 1928 (82 y.o. Male) Treating RN: Clover Mealy, RN, BSN, [place] Primary Care Physician: Einar Crow Other Clinician: Referring Physician: Einar Crow Treating Physician/Extender: Maxwell Caul Weeks in Treatment: 1 Notes Patient presented today with 3 new wounds. Electronic Signature(s) Signed: 08/08/2015 11:01:47 AM By: Elpidio Eric BSN, RN Entered By: Elpidio Eric on 08/08/2015 11:01:46 [name], [name] ([id number]) -------------------------------------------------------------------------------- Lower Extremity Assessment Details Patient Name: [name], [name]. Date of Service: 08/08/2015 10:45 AM Medical Record Number: [id number] Patient Account Number: [id number] Date of Birth/Sex: Nov 30, 1928 (82 y.o. Male) Treating RN: Clover Mealy, RN, BSN, [place] Sink Primary Care Physician: Einar Crow Other Clinician: Referring Physician: Einar Crow Treating Physician/Extender: Altamese [place] in Treatment: 1 Vascular Assessment Pulses: Posterior Tibial Dorsalis Pedis Palpable: [Left:No] [Right:No] Doppler: [Left:Monophasic] [Right:Monophasic] Extremity colors, hair growth, and conditions: Extremity Color: [Left:Mottled] [Right:Mottled] Hair Growth on Extremity: [Left:No] [Right:No] Temperature of Extremity: [Left:Warm] [Right:Warm] Capillary Refill: [Left:< 3 seconds] [Right:< 3 seconds] Toe Nail Assessment Left: Right: Thick: Yes Yes Discolored:  Yes Yes Deformed: No No Improper Length and Hygiene: No No Electronic Signature(s) Signed: 08/08/2015 10:54:10 AM By: Elpidio Eric BSN, RN Previous Signature: 08/08/2015 10:53:51 AM Version By: Elpidio Eric BSN, RN Entered By: Elpidio Eric on 08/08/2015 10:54:10 [name], [name] ([id number]) -------------------------------------------------------------------------------- Multi Wound Chart Details Patient Name: [name], [name]. Date of Service: 08/08/2015 10:45 AM Medical Record Number: [id number] Patient Account Number: [id number] Date of Birth/Sex: 1929/04/23 (82 y.o. Male) Treating RN: Clover Mealy, RN, BSN, [place] Sink Primary Care Physician: Einar Crow Other Clinician: Referring Physician: Einar Crow Treating Physician/Extender: Maxwell Caul Weeks in Treatment: 1 Vital Signs Height(in): 69 Pulse(bpm): 59 Weight(lbs): 205 Blood Pressure 95/42 (mmHg): Body Mass  Index(BMI): 30 Temperature(F): Respiratory Rate 17 (breaths/min): Photos: [1:No Photos] [2:No Photos] [3:No Photos] Wound Location: [1:Left Foot - Plantar] [2:Left Malleolus - Medial] [3:Left Malleolus - Medial] Wounding Event: [1:Gradually Appeared] [2:Gradually Appeared] [3:Gradually Appeared] Primary Etiology: [1:Pressure Ulcer] [2:Pressure Ulcer] [3:Pressure Ulcer] Comorbid History: [1:Cataracts, Sleep Apnea, Cataracts, Sleep Apnea, Cataracts, Sleep Apnea, Coronary Artery Disease, Coronary Artery Disease, Coronary Artery Disease, End Stage Renal Disease, End Stage Renal Disease, End Stage Renal Disease, Gout]  [2:Gout] [3:Gout] Date Acquired: [1:07/04/2015] [2:07/04/2015] [3:07/04/2015] Weeks of Treatment: [1:1] [2:1] [3:1] Wound Status: [1:Open] [2:Open] [3:Open] Measurements L x W x D 1.9x1.7x0.4 [2:6.5x1x0.2] [3:8.5x6x0.4] (cm) Area (cm) : [1:2.537] [2:5.105] [3:40.055] Volume (cm) : [1:1.015] [2:1.021] [3:16.022] % Reduction in Area: [1:32.70%] [2:88.60%] [3:-26.90%] % Reduction in Volume: -169.20%  [2:92.40%] [3:-153.70%] Classification: [1:Unstageable/Unclassified Category/Stage IV] [3:Category/Stage II] Exudate Amount: [1:Medium] [2:Medium] [3:Medium] Exudate Type: [1:Serosanguineous] [2:Serosanguineous] [3:Serosanguineous] Exudate Color: [1:red, brown] [2:red, brown] [3:red, brown] Wound Margin: [1:Distinct, outline attached Distinct, outline attached Distinct, outline attached] Granulation Amount: [1:None Present (0%)] [2:Small (1-33%)] [3:Small (1-33%)] Granulation Quality: [1:N/A] [2:N/A] [3:Pink, Pale] Necrotic Amount: [1:Large (67-100%)] [2:Large (67-100%)] [3:Large (67-100%)] Necrotic Tissue: [1:Eschar, Adherent Slough Eschar, Adherent Slough Eschar, Adherent Slough] Exposed Structures: [1:Fascia: No Fat: No Tendon: No Muscle: No] [2:Fascia: No Fat: No Tendon: No Muscle: No] [3:Fascia: No Fat: No Tendon: No Muscle: No] Joint: No Joint: No Joint: No Bone: No Bone: No Bone: No Limited to Skin Limited to Skin Limited to Skin Breakdown Breakdown Breakdown Epithelialization: None None None Periwound Skin Texture: Callus: Yes Edema: No Edema: No Edema: No Excoriation: No Excoriation: No Excoriation: No Induration: No Induration: No Induration: No Callus: No Callus: No Crepitus: No Crepitus: No Crepitus: No Fluctuance: No Fluctuance: No Fluctuance: No Friable: No Friable: No Friable: No Rash: No Rash: No Rash: No Scarring: No Scarring: No Scarring: No Periwound Skin Moist: Yes Moist: Yes Moist: Yes Moisture: Dry/Scaly: Yes Maceration: No Dry/Scaly: Yes Maceration: No Dry/Scaly: No Maceration: No Periwound Skin Color: Atrophie Blanche: No Atrophie Blanche: No Atrophie Blanche: No Cyanosis: No Cyanosis: No Cyanosis: No Ecchymosis: No Ecchymosis: No Ecchymosis: No Erythema: No Erythema: No Erythema: No Hemosiderin Staining: No Hemosiderin Staining: No Hemosiderin Staining: No Mottled: No Mottled: No Mottled: No Pallor: No Pallor:  No Pallor: No Rubor: No Rubor: No Rubor: No Temperature: No Abnormality No Abnormality No Abnormality Tenderness on Yes No No Palpation: Wound Preparation: Ulcer Cleansing: Ulcer Cleansing: Ulcer Cleansing: Rinsed/Irrigated with Rinsed/Irrigated with Rinsed/Irrigated with Saline Saline Saline Topical Anesthetic Topical Anesthetic Topical Anesthetic Applied: Other: lidocaine Applied: Other: lidocaine Applied: Other: lidocaine 4% 4% 4% Wound Number: Photos: No Photos No Photos No Photos Wound Location: Right Calcaneus - Distal Right Calcaneus - Left Foot - Lateral Proximal Wounding Event: Gradually Appeared Gradually Appeared Gradually Appeared Primary Etiology: Pressure Ulcer Pressure Ulcer Pressure Ulcer Comorbid History: Cataracts, Sleep Apnea, Cataracts, Sleep Apnea, Cataracts, Sleep Apnea, Coronary Artery Disease, Coronary Artery Disease, Coronary Artery Disease, End Stage Renal Disease, End Stage Renal Disease, End Stage Renal Disease, Gout Gout Gout Date Acquired: 08/06/2015 08/06/2015 08/06/2015 Weeks of Treatment: 0 0 0 Wound Status: Open Open Open 1.5x1.2x0.2 1.7x2x0.1 1x2x0.1 Blackburn, [name] ([id number]) Measurements L x W x D (cm) Area (cm) : 1.414 2.67 1.571 Volume (cm) : 0.283 0.267 0.157 % Reduction in Area: 0.00% 0.00% 0.00% % Reduction in Volume: 0.00% 0.00% 0.00% Classification: Category/Stage II Category/Stage II Unstageable/Unclassified Exudate Amount: Small Small None Present Exudate Type: Serosanguineous Serosanguineous N/A Exudate Color: red, brown red, brown N/A Wound Margin: Distinct,  outline attached Distinct, outline attached Distinct, outline attached Granulation Amount: Medium (34-66%) Small (1-33%) None Present (0%) Granulation Quality: N/A Pink N/A Necrotic Amount: Small (1-33%) Medium (34-66%) Large (67-100%) Necrotic Tissue: Adherent Slough Adherent Slough Eschar Exposed Structures: Fascia: No Fascia: No Fascia: No Fat: No Fat:  No Fat: No Tendon: No Tendon: No Tendon: No Muscle: No Muscle: No Muscle: No Joint: No Joint: No Joint: No Bone: No Bone: No Bone: No Limited to Skin Limited to Skin Limited to Skin Breakdown Breakdown Breakdown Epithelialization: None None None Periwound Skin Texture: Edema: No Edema: No Edema: No Excoriation: No Excoriation: No Excoriation: No Induration: No Induration: No Induration: No Callus: No Callus: No Callus: No Crepitus: No Crepitus: No Crepitus: No Fluctuance: No Fluctuance: No Fluctuance: No Friable: No Friable: No Friable: No Rash: No Rash: No Rash: No Scarring: No Scarring: No Scarring: No Periwound Skin Moist: Yes Moist: Yes Dry/Scaly: Yes Moisture: Maceration: No Maceration: No Maceration: No Dry/Scaly: No Dry/Scaly: No Moist: No Periwound Skin Color: Atrophie Blanche: No Atrophie Blanche: No Atrophie Blanche: No Cyanosis: No Cyanosis: No Cyanosis: No Ecchymosis: No Ecchymosis: No Ecchymosis: No Erythema: No Erythema: No Erythema: No Hemosiderin Staining: No Hemosiderin Staining: No Hemosiderin Staining: No Mottled: No Mottled: No Mottled: No Pallor: No Pallor: No Pallor: No Rubor: No Rubor: No Rubor: No Temperature: No Abnormality No Abnormality No Abnormality Tenderness on No No No Palpation: Wound Preparation: Ulcer Cleansing: Ulcer Cleansing: Ulcer Cleansing: Rinsed/Irrigated with Rinsed/Irrigated with Rinsed/Irrigated with Saline Saline Saline Blackburn, [name] ([id number]) Topical Anesthetic Topical Anesthetic Topical Anesthetic Applied: Other Applied: Other: lidocaine Applied: Other: lidocaine 4% 4% Treatment Notes Electronic Signature(s) Signed: 08/08/2015 10:58:43 AM By: Elpidio Eric BSN, RN Entered By: Elpidio Eric on 08/08/2015 10:58:43 Blackburn, [name] ([id number]) -------------------------------------------------------------------------------- Multi-Disciplinary Care Plan Details Patient Name: Blackburn,  [name]. Date of Service: 08/08/2015 10:45 AM Medical Record Number: [id number] Patient Account Number: [id number] Date of Birth/Sex: 02-05-1929 (82 y.o. Male) Treating RN: Clover Mealy, RN, BSN, [place] Sink Primary Care Physician: Einar Crow Other Clinician: Referring Physician: Einar Crow Treating Physician/Extender: Altamese [place] in Treatment: 1 Active Inactive Electronic Signature(s) Signed: 09/26/2015 9:39:26 AM By: Elpidio Eric BSN, RN Previous Signature: 08/08/2015 10:58:36 AM Version By: Elpidio Eric BSN, RN Entered By: Elpidio Eric on 09/26/2015 09:39:25 Blackburn, [name] ([id number]) -------------------------------------------------------------------------------- Pain Assessment Details Patient Name: Lucien, [name]. Date of Service: 08/08/2015 10:45 AM Medical Record Number: [id number] Patient Account Number: [id number] Date of Birth/Sex: Nov 21, 1928 (82 y.o. Male) Treating RN: Clover Mealy, RN, BSN, [place] Sink Primary Care Physician: Einar Crow Other Clinician: Referring Physician: Einar Crow Treating Physician/Extender: Maxwell Caul Weeks in Treatment: 1 Active Problems Location of Pain Severity and Description of Pain Patient Has Paino No Site Locations Pain Management and Medication Current Pain Management: Electronic Signature(s) Signed: 08/08/2015 4:45:24 PM By: Elpidio Eric BSN, RN Entered By: Elpidio Eric on 08/08/2015 10:35:08 Blackburn, [name] ([id number]) -------------------------------------------------------------------------------- Wound Assessment Details Patient Name: Ellinger, [name]. Date of Service: 08/08/2015 10:45 AM Medical Record Number: [id number] Patient Account Number: [id number] Date of Birth/Sex: 05-05-1929 (82 y.o. Male) Treating RN: Clover Mealy, RN, BSN, [place] Sink Primary Care Physician: Einar Crow Other Clinician: Referring Physician: Einar Crow Treating Physician/Extender: Maxwell Caul Weeks in Treatment: 1 Wound  Status Wound Number: 1 Primary Pressure Ulcer Etiology: Wound Location: Left Foot - Plantar Wound Open Wounding Event: Gradually Appeared Status: Date Acquired: 07/04/2015 Comorbid Cataracts, Sleep Apnea, Coronary Weeks Of Treatment: 1 History: Artery Disease, End Stage Renal Clustered Wound: No Disease, Gout Photos Photo Uploaded  By: Elpidio Eric on 08/08/2015 16:39:46 Wound Measurements Length: (cm) 1.9 Width: (cm) 1.7 Depth: (cm) 0.4 Area: (cm) 2.537 Volume: (cm) 1.015 % Reduction in Area: 32.7% % Reduction in Volume: -169.2% Epithelialization: None Tunneling: No Undermining: No Wound Description Classification: Unstageable/Unclassified Wound Margin: Distinct, outline attached Exudate Amount: Medium Exudate Type: Serosanguineous Exudate Color: red, brown Foul Odor After Cleansing: No Wound Bed Granulation Amount: None Present (0%) Exposed Structure Necrotic Amount: Large (67-100%) Fascia Exposed: No Necrotic Quality: Eschar, Adherent Slough Fat Layer Exposed: No Tendon Exposed: No Blackburn, [name] ([id number]) Muscle Exposed: No Joint Exposed: No Bone Exposed: No Limited to Skin Breakdown Periwound Skin Texture Texture Color No Abnormalities Noted: No No Abnormalities Noted: No Callus: Yes Atrophie Blanche: No Crepitus: No Cyanosis: No Excoriation: No Ecchymosis: No Fluctuance: No Erythema: No Friable: No Hemosiderin Staining: No Induration: No Mottled: No Localized Edema: No Pallor: No Rash: No Rubor: No Scarring: No Temperature / Pain Moisture Temperature: No Abnormality No Abnormalities Noted: No Tenderness on Palpation: Yes Dry / Scaly: Yes Maceration: No Moist: Yes Wound Preparation Ulcer Cleansing: Rinsed/Irrigated with Saline Topical Anesthetic Applied: Other: lidocaine 4%, Electronic Signature(s) Signed: 08/08/2015 10:54:35 AM By: Elpidio Eric BSN, RN Entered By: Elpidio Eric on 08/08/2015 10:54:35 Blackburn, [name]  ([id number]) -------------------------------------------------------------------------------- Wound Assessment Details Patient Name: Stickley, [name]. Date of Service: 08/08/2015 10:45 AM Medical Record Number: [id number] Patient Account Number: [id number] Date of Birth/Sex: 1929-06-13 (82 y.o. Male) Treating RN: Clover Mealy, RN, BSN, [place] Primary Care Physician: Einar Crow Other Clinician: Referring Physician: Einar Crow Treating Physician/Extender: Maxwell Caul Weeks in Treatment: 1 Wound Status Wound Number: 2 Primary Pressure Ulcer Etiology: Wound Location: Left Malleolus - Medial Wound Open Wounding Event: Gradually Appeared Status: Date Acquired: 07/04/2015 Comorbid Cataracts, Sleep Apnea, Coronary Weeks Of Treatment: 1 History: Artery Disease, End Stage Renal Clustered Wound: No Disease, Gout Photos Photo Uploaded By: Elpidio Eric on 08/08/2015 16:40:09 Wound Measurements Length: (cm) 6.5 Width: (cm) 1 Depth: (cm) 0.2 Area: (cm) 5.105 Volume: (cm) 1.021 % Reduction in Area: 88.6% % Reduction in Volume: 92.4% Epithelialization: None Tunneling: No Undermining: No Wound Description Classification: Category/Stage IV Wound Margin: Distinct, outline attached Exudate Amount: Medium Exudate Type: Serosanguineous Yo, [name] ([id number]) Foul Odor After Cleansing: No Exudate Color: red, brown Wound Bed Granulation Amount: Small (1-33%) Exposed Structure Necrotic Amount: Large (67-100%) Fascia Exposed: No Necrotic Quality: Eschar, Adherent Slough Fat Layer Exposed: No Tendon Exposed: No Muscle Exposed: No Joint Exposed: No Bone Exposed: No Limited to Skin Breakdown Periwound Skin Texture Texture Color No Abnormalities Noted: No No Abnormalities Noted: No Callus: No Atrophie Blanche: No Crepitus: No Cyanosis: No Excoriation: No Ecchymosis: No Fluctuance: No Erythema: No Friable: No Hemosiderin Staining: No Induration: No Mottled:  No Localized Edema: No Pallor: No Rash: No Rubor: No Scarring: No Temperature / Pain Moisture Temperature: No Abnormality No Abnormalities Noted: No Dry / Scaly: No Maceration: No Moist: Yes Wound Preparation Ulcer Cleansing: Rinsed/Irrigated with Saline Topical Anesthetic Applied: Other: lidocaine 4%, Electronic Signature(s) Signed: 08/08/2015 10:55:21 AM By: Elpidio Eric BSN, RN Entered By: Elpidio Eric on 08/08/2015 10:55:21 Blackburn, [name] ([id number]) -------------------------------------------------------------------------------- Wound Assessment Details Patient Name: Blackburn, [name]. Date of Service: 08/08/2015 10:45 AM Medical Record Number: [id number] Patient Account Number: [id number] Date of Birth/Sex: [birth year] (82 y.o. Male) Treating RN: Clover Mealy, RN, BSN, [place] Sink Primary Care Physician: Einar Crow Other Clinician: Referring Physician: Einar Crow Treating Physician/Extender: Maxwell Caul Weeks in Treatment: 1 Wound Status Wound Number: 3 Primary Pressure Ulcer Etiology: Wound  Location: Left Malleolus - Medial Wound Open Wounding Event: Gradually Appeared Status: Date Acquired: 07/04/2015 Comorbid Cataracts, Sleep Apnea, Coronary Weeks Of Treatment: 1 History: Artery Disease, End Stage Renal Clustered Wound: No Disease, Gout Photos Photo Uploaded By: Elpidio Eric on 08/08/2015 16:40:22 Wound Measurements Length: (cm) 8.5 Width: (cm) 6 Depth: (cm) 0.4 Area: (cm) 40.055 Volume: (cm) 16.022 % Reduction in Area: -26.9% % Reduction in Volume: -153.7% Epithelialization: None Tunneling: No Undermining: No Wound Description Classification: Category/Stage II Wound Margin: Distinct, outline attached Exudate Amount: Medium Exudate Type: Serosanguineous Exudate Color: red, brown Foul Odor After Cleansing: No Wound Bed Granulation Amount: Small (1-33%) Exposed Structure Granulation Quality: Pink, Pale Fascia Exposed: No Necrotic Amount:  Large (67-100%) Fat Layer Exposed: No Necrotic Quality: Eschar, Adherent Slough Tendon Exposed: No Coor, [name] ([id number]) Muscle Exposed: No Joint Exposed: No Bone Exposed: No Limited to Skin Breakdown Periwound Skin Texture Texture Color No Abnormalities Noted: No No Abnormalities Noted: No Callus: No Atrophie Blanche: No Crepitus: No Cyanosis: No Excoriation: No Ecchymosis: No Fluctuance: No Erythema: No Friable: No Hemosiderin Staining: No Induration: No Mottled: No Localized Edema: No Pallor: No Rash: No Rubor: No Scarring: No Temperature / Pain Moisture Temperature: No Abnormality No Abnormalities Noted: No Dry / Scaly: Yes Maceration: No Moist: Yes Wound Preparation Ulcer Cleansing: Rinsed/Irrigated with Saline Topical Anesthetic Applied: Other: lidocaine 4%, Electronic Signature(s) Signed: 08/08/2015 10:56:11 AM By: Elpidio Eric BSN, RN Entered By: Elpidio Eric on 08/08/2015 10:56:11 Blackburn, [name] ([id number]) -------------------------------------------------------------------------------- Wound Assessment Details Patient Name: Viernes, [name]. Date of Service: 08/08/2015 10:45 AM Medical Record Number: [id number] Patient Account Number: [id number] Date of Birth/Sex: 1929/04/18 (82 y.o. Male) Treating RN: Clover Mealy, RN, BSN, [place] Primary Care Physician: Einar Crow Other Clinician: Referring Physician: Einar Crow Treating Physician/Extender: Maxwell Caul Weeks in Treatment: 1 Wound Status Wound Number: 4 Primary Pressure Ulcer Etiology: Wound Location: Right Calcaneus - Distal Wound Open Wounding Event: Gradually Appeared Status: Date Acquired: 08/06/2015 Comorbid Cataracts, Sleep Apnea, Coronary Weeks Of Treatment: 0 History: Artery Disease, End Stage Renal Clustered Wound: No Disease, Gout Photos Photo Uploaded By: Elpidio Eric on 08/08/2015 16:41:04 Wound Measurements Length: (cm) 1.5 Width: (cm) 1.2 Depth: (cm)  0.2 Area: (cm) 1.414 Volume: (cm) 0.283 % Reduction in Area: 0% % Reduction in Volume: 0% Epithelialization: None Tunneling: No Undermining: No Wound Description Classification: Category/Stage II Wound Margin: Distinct, outline attached Exudate Amount: Small Exudate Type: Serosanguineous Exudate Color: red, brown Foul Odor After Cleansing: No Wound Bed Granulation Amount: Medium (34-66%) Exposed Structure Necrotic Amount: Small (1-33%) Fascia Exposed: No Necrotic Quality: Adherent Slough Fat Layer Exposed: No Tendon Exposed: No [place]au, [name] ([id number]) Muscle Exposed: No Joint Exposed: No Bone Exposed: No Limited to Skin Breakdown Periwound Skin Texture Texture Color No Abnormalities Noted: No No Abnormalities Noted: No Callus: No Atrophie Blanche: No Crepitus: No Cyanosis: No Excoriation: No Ecchymosis: No Fluctuance: No Erythema: No Friable: No Hemosiderin Staining: No Induration: No Mottled: No Localized Edema: No Pallor: No Rash: No Rubor: No Scarring: No Temperature / Pain Moisture Temperature: No Abnormality No Abnormalities Noted: No Dry / Scaly: No Maceration: No Moist: Yes Wound Preparation Ulcer Cleansing: Rinsed/Irrigated with Saline Topical Anesthetic Applied: Other Electronic Signature(s) Signed: 08/08/2015 10:57:01 AM By: Elpidio Eric BSN, RN Entered By: Elpidio Eric on 08/08/2015 10:57:00 Fleener, [name] ([id number]) -------------------------------------------------------------------------------- Wound Assessment Details Patient Name: Glauber, [name]. Date of Service: 08/08/2015 10:45 AM Medical Record Number: [id number] Patient Account Number: [id number] Date of Birth/Sex: [birth year] (82 y.o. Male) Treating  RN: Clover Mealy, RN, BSN, [place] Primary Care Physician: Einar Crow Other Clinician: Referring Physician: Einar Crow Treating Physician/Extender: Altamese [place] in Treatment: 1 Wound Status Wound Number: 5  Primary Pressure Ulcer Etiology: Wound Location: Right Calcaneus - Proximal Wound Open Wounding Event: Gradually Appeared Status: Date Acquired: 08/06/2015 Comorbid Cataracts, Sleep Apnea, Coronary Weeks Of Treatment: 0 History: Artery Disease, End Stage Renal Clustered Wound: No Disease, Gout Photos Photo Uploaded By: Elpidio Eric on 08/08/2015 16:41:25 Wound Measurements Length: (cm) 1.7 Width: (cm) 2 Depth: (cm) 0.1 Area: (cm) 2.67 Volume: (cm) 0.267 % Reduction in Area: 0% % Reduction in Volume: 0% Epithelialization: None Tunneling: No Undermining: No Wound Description Classification: Category/Stage II Wound Margin: Distinct, outline attached Exudate Amount: Small Exudate Type: Serosanguineous Exudate Color: red, brown Foul Odor After Cleansing: No Wound Bed Granulation Amount: Small (1-33%) Exposed Structure Granulation Quality: Pink Fascia Exposed: No Necrotic Amount: Medium (34-66%) Fat Layer Exposed: No Necrotic Quality: Adherent Slough Tendon Exposed: No Mcdonald, [name] ([id number]) Muscle Exposed: No Joint Exposed: No Bone Exposed: No Limited to Skin Breakdown Periwound Skin Texture Texture Color No Abnormalities Noted: No No Abnormalities Noted: No Callus: No Atrophie Blanche: No Crepitus: No Cyanosis: No Excoriation: No Ecchymosis: No Fluctuance: No Erythema: No Friable: No Hemosiderin Staining: No Induration: No Mottled: No Localized Edema: No Pallor: No Rash: No Rubor: No Scarring: No Temperature / Pain Moisture Temperature: No Abnormality No Abnormalities Noted: No Dry / Scaly: No Maceration: No Moist: Yes Wound Preparation Ulcer Cleansing: Rinsed/Irrigated with Saline Topical Anesthetic Applied: Other: lidocaine 4%, Electronic Signature(s) Signed: 08/08/2015 10:57:45 AM By: Elpidio Eric BSN, RN Entered By: Elpidio Eric on 08/08/2015 10:57:45 Fosse, [name]  ([id number]) -------------------------------------------------------------------------------- Wound Assessment Details Patient Name: Ivan, [name]. Date of Service: 08/08/2015 10:45 AM Medical Record Number: [id number] Patient Account Number: [id number] Date of Birth/Sex: 01-05-1929 (82 y.o. Male) Treating RN: Clover Mealy, RN, BSN, Rita Primary Care Physician: Einar Crow Other Clinician: Referring Physician: Einar Crow Treating Physician/Extender: Maxwell Caul Weeks in Treatment: 1 Wound Status Wound Number: 6 Primary Pressure Ulcer Etiology: Wound Location: Left Foot - Lateral Wound Open Wounding Event: Gradually Appeared Status: Date Acquired: 08/06/2015 Comorbid Cataracts, Sleep Apnea, Coronary Weeks Of Treatment: 0 History: Artery Disease, End Stage Renal Clustered Wound: No Disease, Gout Photos Photo Uploaded By: Elpidio Eric on 08/08/2015 16:41:43 Wound Measurements Length: (cm) 1 Width: (cm) 2 Depth: (cm) 0.1 Area: (cm) 1.571 Volume: (cm) 0.157 % Reduction in Area: 0% % Reduction in Volume: 0% Epithelialization: None Tunneling: No Undermining: No Wound Description Classification: Unstageable/Unclassified Wound Margin: Distinct, outline attached Exudate Amount: None Present Danis, [name] ([id number]) Foul Odor After Cleansing: No Wound Bed Granulation Amount: None Present (0%) Exposed Structure Necrotic Amount: Large (67-100%) Fascia Exposed: No Necrotic Quality: Eschar Fat Layer Exposed: No Tendon Exposed: No Muscle Exposed: No Joint Exposed: No Bone Exposed: No Limited to Skin Breakdown Periwound Skin Texture Texture Color No Abnormalities Noted: No No Abnormalities Noted: No Callus: No Atrophie Blanche: No Crepitus: No Cyanosis: No Excoriation: No Ecchymosis: No Fluctuance: No Erythema: No Friable: No Hemosiderin Staining: No Induration: No Mottled: No Localized Edema: No Pallor: No Rash: No Rubor: No Scarring: No  Temperature / Pain Moisture Temperature: No Abnormality No Abnormalities Noted: No Dry / Scaly: Yes Maceration: No Moist: No Wound Preparation Ulcer Cleansing: Rinsed/Irrigated with Saline Topical Anesthetic Applied: Other: lidocaine 4%, Electronic Signature(s) Signed: 08/08/2015 10:58:26 AM By: Elpidio Eric BSN, RN Entered By: Elpidio Eric on 08/08/2015 10:58:26 Vitiello, [name] ([id number]) -------------------------------------------------------------------------------- Vitals  Details Patient Name: Hinsley, PAYSON EVRARD. Date of Service: 08/08/2015 10:45 AM Medical Record Number: [id number] Patient Account Number: [id number] Date of Birth/Sex: 1928-07-06 (82 y.o. Male) Treating RN: Afful, RN, BSN, Rita Primary Care Physician: Einar Crow Other Clinician: Referring Physician: Einar Crow Treating Physician/Extender: Altamese [place] in Treatment: 1 Vital Signs Time Taken: 10:35 Pulse (bpm): 59 Height (in): 69 Respiratory Rate (breaths/min): 17 Weight (lbs): 205 Blood Pressure (mmHg): 95/42 Body Mass Index (BMI): 30.3 Reference Range: 80 - 120 mg / dl Electronic Signature(s) Signed: 08/08/2015 4:45:24 PM By: Elpidio Eric BSN, RN Entered By: Elpidio Eric on 08/08/2015 10:35:30

## 2015-08-10 NOTE — Progress Notes (Signed)
Castronovo, DAISY MCNEEL ([id number]) Visit Report for 08/08/2015 Chief Complaint Document Details Patient Name: [name], [name]. Date of Service: 08/08/2015 10:45 AM Medical Record Number: [id number] Patient Account Number: [id number] Date of Birth/Sex: Dec 16, 1928 (82 y.o. Male) Treating RN: Clover Mealy, RN, BSN, [place] Sink Primary Care Physician: Einar Crow Other Clinician: Referring Physician: Einar Crow Treating Physician/Extender: Altamese [place] in Treatment: 1 Information Obtained from: Patient Chief Complaint Patient is here for a extensive wounds on his lateral and medial left ankle as well as the plantar first metatarsal head largely related to a cast injury Electronic Signature(s) Signed: 08/09/2015 5:04:49 PM By: Baltazar Najjar MD Entered By: Baltazar Najjar on 08/08/2015 12:54:35 Sherrard, [name] ([id number]) -------------------------------------------------------------------------------- Debridement Details Patient Name: Bruss, [name]. Date of Service: 08/08/2015 10:45 AM Medical Record Number: [id number] Patient Account Number: [id number] Date of Birth/Sex: 04/19/1929 (82 y.o. Male) Treating RN: Afful, RN, BSN, Rita Primary Care Physician: Einar Crow Other Clinician: Referring Physician: Einar Crow Treating Physician/Extender: Altamese [place] in Treatment: 1 Debridement Performed for Wound #1 Left,Plantar Foot Assessment: Performed By: Physician Maxwell Caul, MD Debridement: Debridement Pre-procedure Yes Verification/Time Out Taken: Start Time: 11:16 Pain Control: Lidocaine 4% Topical Solution Level: Skin/Subcutaneous Tissue Total Area Debrided (L x 1.9 (cm) x 1.7 (cm) = 3.23 (cm) W): Tissue and other Non-Viable, Callus, Exudate, Fibrin/Slough, Subcutaneous material debrided: Instrument: Blade Bleeding: Minimum Hemostasis Achieved: Pressure End Time: 11:20 Procedural Pain: 0 Post Procedural Pain: 0 Response to Treatment:  Procedure was tolerated well Post Debridement Measurements of Total Wound Length: (cm) 1.9 Stage: Unstageable/Unclassified Width: (cm) 1.7 Depth: (cm) 0.4 Volume: (cm) 1.015 Post Procedure Diagnosis Same as Pre-procedure Electronic Signature(s) Signed: 08/08/2015 4:45:24 PM By: Elpidio Eric BSN, RN Signed: 08/09/2015 5:04:49 PM By: Baltazar Najjar MD Entered By: Baltazar Najjar on 08/08/2015 12:53:54 [name], [name] ([id number]) -------------------------------------------------------------------------------- Debridement Details Patient Name: Derouin, [name]. Date of Service: 08/08/2015 10:45 AM Medical Record Number: [id number] Patient Account Number: [id number] Date of Birth/Sex: 15-Nov-1928 (82 y.o. Male) Treating RN: Clover Mealy, RN, BSN, [place] Sink Primary Care Physician: Einar Crow Other Clinician: Referring Physician: Einar Crow Treating Physician/Extender: Altamese [place] in Treatment: 1 Debridement Performed for Wound #3 Left,Medial Malleolus Assessment: Performed By: Physician Maxwell Caul, MD Debridement: Debridement Pre-procedure Yes Verification/Time Out Taken: Start Time: 11:10 Pain Control: Lidocaine 4% Topical Solution Level: Skin/Subcutaneous Tissue Total Area Debrided (L x 8.5 (cm) x 6 (cm) = 51 (cm) W): Tissue and other Non-Viable, Eschar, Exudate, Fibrin/Slough, Subcutaneous material debrided: Instrument: Curette Bleeding: Moderate Hemostasis Achieved: Silver Nitrate End Time: 11:15 Procedural Pain: 0 Post Procedural Pain: 0 Response to Treatment: Procedure was tolerated well Post Debridement Measurements of Total Wound Length: (cm) 8.5 Stage: Category/Stage II Width: (cm) 6 Depth: (cm) 0.4 Volume: (cm) 16.022 Post Procedure Diagnosis Same as Pre-procedure Electronic Signature(s) Signed: 08/08/2015 4:45:24 PM By: Elpidio Eric BSN, RN Signed: 08/09/2015 5:04:49 PM By: Baltazar Najjar MD Entered By: Baltazar Najjar on 08/08/2015  12:54:06 [name], [name] ([id number]) -------------------------------------------------------------------------------- HPI Details Patient Name: Mcerlean, [name]. Date of Service: 08/08/2015 10:45 AM Medical Record Number: [id number] Patient Account Number: [id number] Date of Birth/Sex: Nov 07, 1928 (82 y.o. Male) Treating RN: Clover Mealy, RN, BSN, [place] Sink Primary Care Physician: Einar Crow Other Clinician: Referring Physician: Einar Crow Treating Physician/Extender: Altamese [place] in Treatment: 1 History of Present Illness HPI Description: 08/01/15; this is a patient who had a trimalleolar ankle fracture on the left in early December. He underwent ORIF and had a cast placed for 6 weeks. Apparently there  was nothing noted to be wrong until the cast was removed and he was noted to have extensive injury to the medial aspect of his left ankle and foot and the lateral aspect of the ankle to a lesser extent. He was also noted by the staff on arrival here to have a necrotic area over the left first metatarsal head. He has been in a nursing home for rehabilitation at [facility]. Had been using Santyl to the medial foot and ankle but not the lateral aspect of period and there has been no specific treatment to the left first metatarsal head. The patient has chronic renal failure on dialysis for 7 years but he is not a diabetic. He has no known arterial problems to his lower legs. He has no prior wound issues. 08/08/15; this is a patient I saw last week. He had advertised significant necrotic wounds after removal of a cast for a fractured ankle. This may have been in injury from the cast itself or perhaps fracture blisters I am not really sure. He has been at [facility] facility. We noted last week that he did not have evidence of good perfusion to his feet do. We sent him for arterial Dopplers which were apparently done in vascular surgery. These were I gather very of normal  and he has been booked for an arteriogram next Monday Electronic Signature(s) Signed: 08/09/2015 5:04:49 PM By: Baltazar Najjar MD Entered By: Baltazar Najjar on 08/08/2015 12:57:54 [name], [name] ([id number]) -------------------------------------------------------------------------------- Physical Exam Details Patient Name: Pflug, [name]. Date of Service: 08/08/2015 10:45 AM Medical Record Number: [id number] Patient Account Number: [id number] Date of Birth/Sex: 08-11-1928 (82 y.o. Male) Treating RN: Clover Mealy, RN, BSN, [place] Sink Primary Care Physician: Einar Crow Other Clinician: Referring Physician: Einar Crow Treating Physician/Extender: Maxwell Caul Weeks in Treatment: 1 Notes Wound exam; the patient has extensive necrotic wounds over the medial aspect of his left ankle and foot all of these were surgically debridement. Also over the first left metatarsal head is surgical debridement done for a non-healthy-looking wound. He has more superficial wounds on the lateral left foot and ankle Electronic Signature(s) Signed: 08/09/2015 5:04:49 PM By: Baltazar Najjar MD Entered By: Baltazar Najjar on 08/08/2015 12:58:48 [name], [name] ([id number]) -------------------------------------------------------------------------------- Physician Orders Details Patient Name: [name], [name]. Date of Service: 08/08/2015 10:45 AM Medical Record Number: [id number] Patient Account Number: [id number] Date of Birth/Sex: [birth year] (82 y.o. Male) Treating RN: Clover Mealy, RN, BSN, [place] Sink Primary Care Physician: Einar Crow Other Clinician: Referring Physician: Einar Crow Treating Physician/Extender: Altamese [place] in Treatment: 1 Verbal / Phone Orders: Yes Clinician: Afful, RN, BSN, Rita Read Back and Verified: Yes Diagnosis Coding Wound Cleansing Wound #1 Left,Plantar Foot o Clean wound with Normal Saline. Wound #2 Left,Lateral Malleolus o Clean wound with Normal  Saline. Wound #3 Left,Medial Malleolus o Clean wound with Normal Saline. Wound #4 Right,Distal Calcaneus o Clean wound with Normal Saline. Wound #5 Right,Proximal Calcaneus o Clean wound with Normal Saline. Wound #6 Left,Lateral Foot o Clean wound with Normal Saline. Anesthetic Wound #1 Left,Plantar Foot o Topical Lidocaine 4% cream applied to wound bed prior to debridement Wound #2 Left,Lateral Malleolus o Topical Lidocaine 4% cream applied to wound bed prior to debridement Wound #3 Left,Medial Malleolus o Topical Lidocaine 4% cream applied to wound bed prior to debridement Wound #4 Right,Distal Calcaneus o Topical Lidocaine 4% cream applied to wound bed prior to debridement Wound #5 Right,Proximal Calcaneus o Topical Lidocaine 4% cream applied to wound bed prior  to debridement Wound #6 Left,Lateral Foot o Topical Lidocaine 4% cream applied to wound bed prior to debridement Gallaway, [name] ([id number]) Primary Wound Dressing Wound #1 Left,Plantar Foot o Santyl Ointment Wound #2 Left,Lateral Malleolus o Santyl Ointment Wound #3 Left,Medial Malleolus o Santyl Ointment Wound #4 Right,Distal Calcaneus o Santyl Ointment Wound #5 Right,Proximal Calcaneus o Santyl Ointment Wound #6 Left,Lateral Foot o Santyl Ointment Secondary Dressing Wound #1 Left,Plantar Foot o Gauze and Kerlix/Conform Wound #2 Left,Lateral Malleolus o Gauze and Kerlix/Conform Wound #3 Left,Medial Malleolus o Gauze and Kerlix/Conform Wound #4 Right,Distal Calcaneus o Gauze and Kerlix/Conform Wound #5 Right,Proximal Calcaneus o Gauze and Kerlix/Conform Wound #6 Left,Lateral Foot o Gauze and Kerlix/Conform Dressing Change Frequency Wound #1 Left,Plantar Foot o Change dressing every day. Wound #2 Left,Lateral Malleolus o Change dressing every day. Wound #3 Left,Medial Malleolus o Change dressing every day. [name], [name] ([id number]) Wound #4  Right,Distal Calcaneus o Change dressing every day. Wound #5 Right,Proximal Calcaneus o Change dressing every day. Wound #6 Left,Lateral Foot o Change dressing every day. Follow-up Appointments Wound #1 Left,Plantar Foot o Return Appointment in 2 weeks. Wound #2 Left,Lateral Malleolus o Return Appointment in 2 weeks. Wound #3 Left,Medial Malleolus o Return Appointment in 2 weeks. Wound #4 Right,Distal Calcaneus o Return Appointment in 2 weeks. Wound #5 Right,Proximal Calcaneus o Return Appointment in 2 weeks. Wound #6 Left,Lateral Foot o Return Appointment in 2 weeks. Off-Loading Wound #1 Left,Plantar Foot o Other: - DH walking boot Wound #2 Left,Lateral Malleolus o Other: - DH walking boot Wound #3 Left,Medial Malleolus o Other: - DH walking boot Wound #4 Right,Distal Calcaneus o Other: - DH walking boot Wound #5 Right,Proximal Calcaneus o Other: - DH walking boot Wound #6 Left,Lateral Foot o Other: - DH walking boot [name], [name] ([id number]) Electronic Signature(s) Signed: 08/08/2015 4:45:24 PM By: Elpidio Eric BSN, RN Signed: 08/09/2015 5:04:49 PM By: Baltazar Najjar MD Entered By: Elpidio Eric on 08/08/2015 11:41:24 [name], [name] ([id number]) -------------------------------------------------------------------------------- Problem List Details Patient Name: Fauble, [name]. Date of Service: 08/08/2015 10:45 AM Medical Record Number: [id number] Patient Account Number: [id number] Date of Birth/Sex: Apr 17, 1929 (82 y.o. Male) Treating RN: Clover Mealy, RN, BSN, [place] Sink Primary Care Physician: Einar Crow Other Clinician: Referring Physician: Einar Crow Treating Physician/Extender: Maxwell Caul Weeks in Treatment: 1 Active Problems ICD-10 Encounter Code Description Active Date Diagnosis L97.312 Non-pressure chronic ulcer of right ankle with fat layer 08/01/2015 Yes exposed S82.852S Displaced trimalleolar fracture of left lower  leg, sequela 08/01/2015 Yes N18.6 End stage renal disease 08/01/2015 Yes G20 Parkinson's disease 08/01/2015 Yes L97.322 Non-pressure chronic ulcer of left ankle with fat layer 08/08/2015 Yes exposed Inactive Problems Resolved Problems Electronic Signature(s) Signed: 08/09/2015 5:04:49 PM By: Baltazar Najjar MD Entered By: Baltazar Najjar on 08/08/2015 13:01:29 [name], [name] ([id number]) -------------------------------------------------------------------------------- Progress Note Details Patient Name: [name], [name]. Date of Service: 08/08/2015 10:45 AM Medical Record Number: [id number] Patient Account Number: [id number] Date of Birth/Sex: Jun 11, 1929 (82 y.o. Male) Treating RN: Clover Mealy, RN, BSN, [place] Sink Primary Care Physician: Einar Crow Other Clinician: Referring Physician: Einar Crow Treating Physician/Extender: Altamese [place] in Treatment: 1 Subjective Chief Complaint Information obtained from Patient Patient is here for a extensive wounds on his lateral and medial left ankle as well as the plantar first metatarsal head largely related to a cast injury History of Present Illness (HPI) 08/01/15; this is a patient who had a trimalleolar ankle fracture on the left in early December. He underwent ORIF and had a cast placed for 6 weeks.  Apparently there was nothing noted to be wrong until the cast was removed and he was noted to have extensive injury to the medial aspect of his left ankle and foot and the lateral aspect of the ankle to a lesser extent. He was also noted by the staff on arrival here to have a necrotic area over the left first metatarsal head. He has been in a nursing home for rehabilitation at [facility]. Had been using Santyl to the medial foot and ankle but not the lateral aspect of period and there has been no specific treatment to the left first metatarsal head. The patient has chronic renal failure on dialysis for 7 years but he is not a diabetic.  He has no known arterial problems to his lower legs. He has no prior wound issues. 08/08/15; this is a patient I saw last week. He had advertised significant necrotic wounds after removal of a cast for a fractured ankle. This may have been in injury from the cast itself or perhaps fracture blisters I am not really sure. He has been at [facility] facility. We noted last week that he did not have evidence of good perfusion to his feet do. We sent him for arterial Dopplers which were apparently done in vascular surgery. These were I gather very of normal and he has been booked for an arteriogram next Monday Objective Constitutional Vitals Time Taken: 10:35 AM, Height: 69 in, Weight: 205 lbs, BMI: 30.3, Pulse: 59 bpm, Respiratory Rate: 17 breaths/min, Blood Pressure: 95/42 mmHg. Integumentary (Hair, Skin) Wound #1 status is Open. Original cause of wound was Gradually Appeared. The wound is located on the Left,Plantar Foot. The wound measures 1.9cm length x 1.7cm width x 0.4cm depth; 2.537cm^2 area and 1.015cm^3 volume. The wound is limited to skin breakdown. There is no tunneling or undermining noted. [name], [name] ([id number]) There is a medium amount of serosanguineous drainage noted. The wound margin is distinct with the outline attached to the wound base. There is no granulation within the wound bed. There is a large (67- 100%) amount of necrotic tissue within the wound bed including Eschar and Adherent Slough. The periwound skin appearance exhibited: Callus, Dry/Scaly, Moist. The periwound skin appearance did not exhibit: Crepitus, Excoriation, Fluctuance, Friable, Induration, Localized Edema, Rash, Scarring, Maceration, Atrophie Blanche, Cyanosis, Ecchymosis, Hemosiderin Staining, Mottled, Pallor, Rubor, Erythema. Periwound temperature was noted as No Abnormality. The periwound has tenderness on palpation. Wound #2 status is Open. Original cause of wound was Gradually  Appeared. The wound is located on the Left,Lateral Malleolus. The wound measures 6.5cm length x 1cm width x 0.2cm depth; 5.105cm^2 area and 1.021cm^3 volume. The wound is limited to skin breakdown. There is no tunneling or undermining noted. There is a medium amount of serosanguineous drainage noted. The wound margin is distinct with the outline attached to the wound base. There is small (1-33%) granulation within the wound bed. There is a large (67-100%) amount of necrotic tissue within the wound bed including Eschar and Adherent Slough. The periwound skin appearance exhibited: Moist. The periwound skin appearance did not exhibit: Callus, Crepitus, Excoriation, Fluctuance, Friable, Induration, Localized Edema, Rash, Scarring, Dry/Scaly, Maceration, Atrophie Blanche, Cyanosis, Ecchymosis, Hemosiderin Staining, Mottled, Pallor, Rubor, Erythema. Periwound temperature was noted as No Abnormality. Wound #3 status is Open. Original cause of wound was Gradually Appeared. The wound is located on the Left,Medial Malleolus. The wound measures 8.5cm length x 6cm width x 0.4cm depth; 40.055cm^2 area and 16.022cm^3 volume. The wound is limited to  skin breakdown. There is no tunneling or undermining noted. There is a medium amount of serosanguineous drainage noted. The wound margin is distinct with the outline attached to the wound base. There is small (1-33%) pink, pale granulation within the wound bed. There is a large (67-100%) amount of necrotic tissue within the wound bed including Eschar and Adherent Slough. The periwound skin appearance exhibited: Dry/Scaly, Moist. The periwound skin appearance did not exhibit: Callus, Crepitus, Excoriation, Fluctuance, Friable, Induration, Localized Edema, Rash, Scarring, Maceration, Atrophie Blanche, Cyanosis, Ecchymosis, Hemosiderin Staining, Mottled, Pallor, Rubor, Erythema. Periwound temperature was noted as No Abnormality. Wound #4 status is Open. Original cause  of wound was Gradually Appeared. The wound is located on the Right,Distal Calcaneus. The wound measures 1.5cm length x 1.2cm width x 0.2cm depth; 1.414cm^2 area and 0.283cm^3 volume. The wound is limited to skin breakdown. There is no tunneling or undermining noted. There is a small amount of serosanguineous drainage noted. The wound margin is distinct with the outline attached to the wound base. There is medium (34-66%) granulation within the wound bed. There is a small (1-33%) amount of necrotic tissue within the wound bed including Adherent Slough. The periwound skin appearance exhibited: Moist. The periwound skin appearance did not exhibit: Callus, Crepitus, Excoriation, Fluctuance, Friable, Induration, Localized Edema, Rash, Scarring, Dry/Scaly, Maceration, Atrophie Blanche, Cyanosis, Ecchymosis, Hemosiderin Staining, Mottled, Pallor, Rubor, Erythema. Periwound temperature was noted as No Abnormality. Wound #5 status is Open. Original cause of wound was Gradually Appeared. The wound is located on the Right,Proximal Calcaneus. The wound measures 1.7cm length x 2cm width x 0.1cm depth; 2.67cm^2 area and 0.267cm^3 volume. The wound is limited to skin breakdown. There is no tunneling or undermining noted. There is a small amount of serosanguineous drainage noted. The wound margin is distinct with the outline attached to the wound base. There is small (1-33%) pink granulation within the wound bed. There is a medium (34-66%) amount of necrotic tissue within the wound bed including Adherent Slough. The periwound skin appearance exhibited: Moist. The periwound skin appearance did not exhibit: Callus, Crepitus, Excoriation, Fluctuance, Friable, Induration, Localized Edema, Rash, Scarring, Dry/Scaly, Maceration, Atrophie Blanche, Cyanosis, Ecchymosis, Hemosiderin Staining, Mottled, Pallor, Rubor, [name], [name] ([id number]) Erythema. Periwound temperature was noted as No Abnormality. Wound #6 status  is Open. Original cause of wound was Gradually Appeared. The wound is located on the Left,Lateral Foot. The wound measures 1cm length x 2cm width x 0.1cm depth; 1.571cm^2 area and 0.157cm^3 volume. The wound is limited to skin breakdown. There is no tunneling or undermining noted. There is a none present amount of drainage noted. The wound margin is distinct with the outline attached to the wound base. There is no granulation within the wound bed. There is a large (67-100%) amount of necrotic tissue within the wound bed including Eschar. The periwound skin appearance exhibited: Dry/Scaly. The periwound skin appearance did not exhibit: Callus, Crepitus, Excoriation, Fluctuance, Friable, Induration, Localized Edema, Rash, Scarring, Maceration, Moist, Atrophie Blanche, Cyanosis, Ecchymosis, Hemosiderin Staining, Mottled, Pallor, Rubor, Erythema. Periwound temperature was noted as No Abnormality. Assessment Active Problems ICD-10 L97.312 - Non-pressure chronic ulcer of right ankle with fat layer exposed S82.852S - Displaced trimalleolar fracture of left lower leg, sequela N18.6 - End stage renal disease G20 - Parkinson's disease Procedures Wound #1 Wound #1 is a Pressure Ulcer located on the Left,Plantar Foot . There was a Skin/Subcutaneous Tissue Debridement (40981-19147) debridement with total area of 3.23 sq cm performed by Maxwell Caul, MD. with the following instrument(s):  Blade to remove Non-Viable tissue/material including Exudate, Fibrin/Slough, Callus, and Subcutaneous after achieving pain control using Lidocaine 4% Topical Solution. A time out was conducted prior to the start of the procedure. A Minimum amount of bleeding was controlled with Pressure. The procedure was tolerated well with a pain level of 0 throughout and a pain level of 0 following the procedure. Post Debridement Measurements: 1.9cm length x 1.7cm width x 0.4cm depth; 1.015cm^3 volume. Post debridement Stage  noted as Unstageable/Unclassified. Post procedure Diagnosis Wound #1: Same as Pre-Procedure Wound #3 Wound #3 is a Pressure Ulcer located on the Left,Medial Malleolus . There was a Skin/Subcutaneous Tissue Debridement (40981-19147) debridement with total area of 51 sq cm performed by Maxwell Caul, MD. with the following instrument(s): Curette to remove Non-Viable tissue/material including Exudate, Fibrin/Slough, Eschar, and Subcutaneous after achieving pain control using Lidocaine 4% Topical Mccluskey, [name] ([id number]) Solution. A time out was conducted prior to the start of the procedure. A Moderate amount of bleeding was controlled with Silver Nitrate. The procedure was tolerated well with a pain level of 0 throughout and a pain level of 0 following the procedure. Post Debridement Measurements: 8.5cm length x 6cm width x 0.4cm depth; 16.022cm^3 volume. Post debridement Stage noted as Category/Stage II. Post procedure Diagnosis Wound #3: Same as Pre-Procedure Plan Wound Cleansing: Wound #1 Left,Plantar Foot: Clean wound with Normal Saline. Wound #2 Left,Lateral Malleolus: Clean wound with Normal Saline. Wound #3 Left,Medial Malleolus: Clean wound with Normal Saline. Wound #4 Right,Distal Calcaneus: Clean wound with Normal Saline. Wound #5 Right,Proximal Calcaneus: Clean wound with Normal Saline. Wound #6 Left,Lateral Foot: Clean wound with Normal Saline. Anesthetic: Wound #1 Left,Plantar Foot: Topical Lidocaine 4% cream applied to wound bed prior to debridement Wound #2 Left,Lateral Malleolus: Topical Lidocaine 4% cream applied to wound bed prior to debridement Wound #3 Left,Medial Malleolus: Topical Lidocaine 4% cream applied to wound bed prior to debridement Wound #4 Right,Distal Calcaneus: Topical Lidocaine 4% cream applied to wound bed prior to debridement Wound #5 Right,Proximal Calcaneus: Topical Lidocaine 4% cream applied to wound bed prior to debridement Wound #6  Left,Lateral Foot: Topical Lidocaine 4% cream applied to wound bed prior to debridement Primary Wound Dressing: Wound #1 Left,Plantar Foot: Santyl Ointment Wound #2 Left,Lateral Malleolus: Santyl Ointment Wound #3 Left,Medial Malleolus: Santyl Ointment Wound #4 Right,Distal Calcaneus: Santyl Ointment Wound #5 Right,Proximal Calcaneus: Santyl Ointment Kishi, [name] ([id number]) Wound #6 Left,Lateral Foot: Santyl Ointment Secondary Dressing: Wound #1 Left,Plantar Foot: Gauze and Kerlix/Conform Wound #2 Left,Lateral Malleolus: Gauze and Kerlix/Conform Wound #3 Left,Medial Malleolus: Gauze and Kerlix/Conform Wound #4 Right,Distal Calcaneus: Gauze and Kerlix/Conform Wound #5 Right,Proximal Calcaneus: Gauze and Kerlix/Conform Wound #6 Left,Lateral Foot: Gauze and Kerlix/Conform Dressing Change Frequency: Wound #1 Left,Plantar Foot: Change dressing every day. Wound #2 Left,Lateral Malleolus: Change dressing every day. Wound #3 Left,Medial Malleolus: Change dressing every day. Wound #4 Right,Distal Calcaneus: Change dressing every day. Wound #5 Right,Proximal Calcaneus: Change dressing every day. Wound #6 Left,Lateral Foot: Change dressing every day. Follow-up Appointments: Wound #1 Left,Plantar Foot: Return Appointment in 2 weeks. Wound #2 Left,Lateral Malleolus: Return Appointment in 2 weeks. Wound #3 Left,Medial Malleolus: Return Appointment in 2 weeks. Wound #4 Right,Distal Calcaneus: Return Appointment in 2 weeks. Wound #5 Right,Proximal Calcaneus: Return Appointment in 2 weeks. Wound #6 Left,Lateral Foot: Return Appointment in 2 weeks. Off-Loading: Wound #1 Left,Plantar Foot: Other: - DH walking boot Wound #2 Left,Lateral Malleolus: Other: - DH walking boot Wound #3 Left,Medial Malleolus: Other: - DH walking boot Wound #4 Right,Distal Calcaneus: Other: -  DH walking boot Wound #5 Right,Proximal Calcaneus: Birchall, [name] ([id number]) Other: - DH  walking boot Wound #6 Left,Lateral Foot: Other: - DH walking boot We will continue with santyl based dressings to all his wounds. Arteriogram on monday F/u in 2 weeks Electronic Signature(s) Signed: 08/09/2015 5:04:49 PM By: Baltazar Najjar MD Entered By: Baltazar Najjar on 08/08/2015 12:59:45 Earp, [name] ([id number]) -------------------------------------------------------------------------------- SuperBill Details Patient Name: Reish, [name]. Date of Service: 08/08/2015 Medical Record Number: [id number] Patient Account Number: [id number] Date of Birth/Sex: 29-Nov-1928 (82 y.o. 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 Facility Procedures CPT4 Code: 46962952 Description: 11042 - DEB SUBQ TISSUE 20 SQ CM/< ICD-10 Description Diagnosis L97.322 Non-pressure chronic ulcer of left ankle with fat Modifier: layer exposed Quantity: 1 CPT4 Code: 84132440 Description: 11045 - DEB SUBQ TISS EA ADDL 20CM ICD-10 Description Diagnosis L97.322 Non-pressure chronic ulcer of left ankle with fat Modifier: layer exposed Quantity: 2 Physician Procedures CPT4 Code: [id number] Description: 11042 - WC PHYS SUBQ TISS 20 SQ CM ICD-10 Description Diagnosis L97.322 Non-pressure chronic ulcer of left ankle with fat Modifier: layer exposed Quantity: 1 CPT4 Code: [id number] Patricelli, Chrissie Noa Description: 11045 - WC PHYS SUBQ TISS EA ADDL 20 CM ICD-10 Description Diagnosis L97.322 Non-pressure chronic ulcer of left ankle with fat E.  ([id number]) Modifier: layer exposed Quantity: 2 Electronic Signature(s) Signed: 08/09/2015 5:04:49 PM By: Baltazar Najjar MD Entered By: Baltazar Najjar on 08/08/2015 13:02:19

## 2015-08-14 ENCOUNTER — Inpatient Hospital Stay: Payer: Medicare Other

## 2015-08-14 ENCOUNTER — Inpatient Hospital Stay
Admission: RE | Admit: 2015-08-14 | Discharge: 2015-08-14 | Disposition: A | Discharge status: Home / Self Care | Payer: Medicare Other | Source: Ambulatory Visit | Attending: Adult Health | Admitting: Adult Health

## 2015-08-14 ENCOUNTER — Inpatient Hospital Stay
Admission: RE | Admit: 2015-08-14 | Discharge: 2015-08-25 | DRG: 166 | Disposition: A | Discharge status: Hospice | Payer: Medicare Other | Source: Ambulatory Visit | Attending: Internal Medicine | Admitting: Internal Medicine

## 2015-08-14 ENCOUNTER — Encounter: Payer: Self-pay | Admitting: *Deleted

## 2015-08-14 ENCOUNTER — Encounter
Admission: AD | Disposition: A | Discharge status: Hospice | Payer: Self-pay | Source: Ambulatory Visit | Attending: Internal Medicine

## 2015-08-14 DIAGNOSIS — R570 Cardiogenic shock: Secondary | ICD-10-CM | POA: Diagnosis not present

## 2015-08-14 DIAGNOSIS — E11622 Type 2 diabetes mellitus with other skin ulcer: Secondary | ICD-10-CM | POA: Diagnosis present

## 2015-08-14 DIAGNOSIS — E785 Hyperlipidemia, unspecified: Secondary | ICD-10-CM | POA: Diagnosis present

## 2015-08-14 DIAGNOSIS — E875 Hyperkalemia: Secondary | ICD-10-CM | POA: Diagnosis not present

## 2015-08-14 DIAGNOSIS — R092 Respiratory arrest: Secondary | ICD-10-CM | POA: Diagnosis not present

## 2015-08-14 DIAGNOSIS — I2510 Atherosclerotic heart disease of native coronary artery without angina pectoris: Secondary | ICD-10-CM | POA: Diagnosis present

## 2015-08-14 DIAGNOSIS — N2581 Secondary hyperparathyroidism of renal origin: Secondary | ICD-10-CM | POA: Diagnosis present

## 2015-08-14 DIAGNOSIS — I132 Hypertensive heart and chronic kidney disease with heart failure and with stage 5 chronic kidney disease, or end stage renal disease: Secondary | ICD-10-CM | POA: Diagnosis present

## 2015-08-14 DIAGNOSIS — E1165 Type 2 diabetes mellitus with hyperglycemia: Secondary | ICD-10-CM | POA: Diagnosis present

## 2015-08-14 DIAGNOSIS — I723 Aneurysm of iliac artery: Secondary | ICD-10-CM | POA: Diagnosis present

## 2015-08-14 DIAGNOSIS — J9600 Acute respiratory failure, unspecified whether with hypoxia or hypercapnia: Secondary | ICD-10-CM

## 2015-08-14 DIAGNOSIS — Z951 Presence of aortocoronary bypass graft: Secondary | ICD-10-CM

## 2015-08-14 DIAGNOSIS — H9190 Unspecified hearing loss, unspecified ear: Secondary | ICD-10-CM | POA: Diagnosis present

## 2015-08-14 DIAGNOSIS — J9601 Acute respiratory failure with hypoxia: Principal | ICD-10-CM | POA: Diagnosis present

## 2015-08-14 DIAGNOSIS — I5022 Chronic systolic (congestive) heart failure: Secondary | ICD-10-CM | POA: Diagnosis present

## 2015-08-14 DIAGNOSIS — I469 Cardiac arrest, cause unspecified: Secondary | ICD-10-CM

## 2015-08-14 DIAGNOSIS — Z6831 Body mass index (BMI) 31.0-31.9, adult: Secondary | ICD-10-CM | POA: Diagnosis not present

## 2015-08-14 DIAGNOSIS — E1151 Type 2 diabetes mellitus with diabetic peripheral angiopathy without gangrene: Secondary | ICD-10-CM | POA: Diagnosis present

## 2015-08-14 DIAGNOSIS — Z79899 Other long term (current) drug therapy: Secondary | ICD-10-CM | POA: Diagnosis not present

## 2015-08-14 DIAGNOSIS — I255 Ischemic cardiomyopathy: Secondary | ICD-10-CM | POA: Diagnosis present

## 2015-08-14 DIAGNOSIS — G931 Anoxic brain damage, not elsewhere classified: Secondary | ICD-10-CM | POA: Diagnosis present

## 2015-08-14 DIAGNOSIS — Z87891 Personal history of nicotine dependence: Secondary | ICD-10-CM | POA: Diagnosis not present

## 2015-08-14 DIAGNOSIS — I252 Old myocardial infarction: Secondary | ICD-10-CM

## 2015-08-14 DIAGNOSIS — Z4659 Encounter for fitting and adjustment of other gastrointestinal appliance and device: Secondary | ICD-10-CM

## 2015-08-14 DIAGNOSIS — J9621 Acute and chronic respiratory failure with hypoxia: Secondary | ICD-10-CM | POA: Diagnosis not present

## 2015-08-14 DIAGNOSIS — R404 Transient alteration of awareness: Secondary | ICD-10-CM | POA: Diagnosis not present

## 2015-08-14 DIAGNOSIS — Z01818 Encounter for other preprocedural examination: Secondary | ICD-10-CM

## 2015-08-14 DIAGNOSIS — R05 Cough: Secondary | ICD-10-CM | POA: Diagnosis present

## 2015-08-14 DIAGNOSIS — I739 Peripheral vascular disease, unspecified: Secondary | ICD-10-CM

## 2015-08-14 DIAGNOSIS — L97329 Non-pressure chronic ulcer of left ankle with unspecified severity: Secondary | ICD-10-CM | POA: Diagnosis present

## 2015-08-14 DIAGNOSIS — G4733 Obstructive sleep apnea (adult) (pediatric): Secondary | ICD-10-CM | POA: Diagnosis present

## 2015-08-14 DIAGNOSIS — L89152 Pressure ulcer of sacral region, stage 2: Secondary | ICD-10-CM | POA: Diagnosis present

## 2015-08-14 DIAGNOSIS — E46 Unspecified protein-calorie malnutrition: Secondary | ICD-10-CM | POA: Diagnosis present

## 2015-08-14 DIAGNOSIS — J150 Pneumonia due to Klebsiella pneumoniae: Secondary | ICD-10-CM | POA: Diagnosis not present

## 2015-08-14 DIAGNOSIS — Z515 Encounter for palliative care: Secondary | ICD-10-CM | POA: Diagnosis not present

## 2015-08-14 DIAGNOSIS — N186 End stage renal disease: Secondary | ICD-10-CM

## 2015-08-14 DIAGNOSIS — R0600 Dyspnea, unspecified: Secondary | ICD-10-CM | POA: Diagnosis not present

## 2015-08-14 DIAGNOSIS — E1122 Type 2 diabetes mellitus with diabetic chronic kidney disease: Secondary | ICD-10-CM | POA: Diagnosis present

## 2015-08-14 DIAGNOSIS — G20 Parkinson's disease: Secondary | ICD-10-CM | POA: Diagnosis present

## 2015-08-14 DIAGNOSIS — Z992 Dependence on renal dialysis: Secondary | ICD-10-CM

## 2015-08-14 DIAGNOSIS — Z8673 Personal history of transient ischemic attack (TIA), and cerebral infarction without residual deficits: Secondary | ICD-10-CM | POA: Diagnosis not present

## 2015-08-14 DIAGNOSIS — G9340 Encephalopathy, unspecified: Secondary | ICD-10-CM | POA: Diagnosis not present

## 2015-08-14 DIAGNOSIS — Z950 Presence of cardiac pacemaker: Secondary | ICD-10-CM | POA: Diagnosis not present

## 2015-08-14 DIAGNOSIS — D631 Anemia in chronic kidney disease: Secondary | ICD-10-CM | POA: Diagnosis present

## 2015-08-14 DIAGNOSIS — Z66 Do not resuscitate: Secondary | ICD-10-CM | POA: Diagnosis not present

## 2015-08-14 DIAGNOSIS — R1310 Dysphagia, unspecified: Secondary | ICD-10-CM | POA: Diagnosis not present

## 2015-08-14 DIAGNOSIS — L89623 Pressure ulcer of left heel, stage 3: Secondary | ICD-10-CM | POA: Diagnosis present

## 2015-08-14 DIAGNOSIS — Z7982 Long term (current) use of aspirin: Secondary | ICD-10-CM

## 2015-08-14 DIAGNOSIS — K219 Gastro-esophageal reflux disease without esophagitis: Secondary | ICD-10-CM | POA: Diagnosis present

## 2015-08-14 DIAGNOSIS — L8990 Pressure ulcer of unspecified site, unspecified stage: Secondary | ICD-10-CM | POA: Insufficient documentation

## 2015-08-14 HISTORY — PX: PERIPHERAL VASCULAR CATHETERIZATION: SHX172C

## 2015-08-14 LAB — HEPATIC FUNCTION PANEL
ALBUMIN: 3 g/dL — AB (ref 3.5–5.0)
ALT: 6 U/L — ABNORMAL LOW (ref 17–63)
AST: 34 U/L (ref 15–41)
Alkaline Phosphatase: 98 U/L (ref 38–126)
BILIRUBIN TOTAL: 1.2 mg/dL (ref 0.3–1.2)
Bilirubin, Direct: 0.2 mg/dL (ref 0.1–0.5)
Indirect Bilirubin: 1 mg/dL — ABNORMAL HIGH (ref 0.3–0.9)
Total Protein: 7 g/dL (ref 6.5–8.1)

## 2015-08-14 LAB — BLOOD GAS, ARTERIAL
ACID-BASE DEFICIT: 2.4 mmol/L — AB (ref 0.0–2.0)
Allens test (pass/fail): POSITIVE — AB
Bicarbonate: 24.6 mEq/L (ref 21.0–28.0)
FIO2: 1
MECHVT: 500 mL
O2 Saturation: 100 %
PEEP: 5 cmH2O
PH ART: 7.3 — AB (ref 7.350–7.450)
Patient temperature: 37
RATE: 15 resp/min
pCO2 arterial: 50 mmHg — ABNORMAL HIGH (ref 32.0–48.0)
pO2, Arterial: 432 mmHg — ABNORMAL HIGH (ref 83.0–108.0)

## 2015-08-14 LAB — GLUCOSE, CAPILLARY
GLUCOSE-CAPILLARY: 162 mg/dL — AB (ref 65–99)
Glucose-Capillary: 122 mg/dL — ABNORMAL HIGH (ref 65–99)
Glucose-Capillary: 123 mg/dL — ABNORMAL HIGH (ref 65–99)
Glucose-Capillary: 76 mg/dL (ref 65–99)

## 2015-08-14 LAB — CBC WITH DIFFERENTIAL/PLATELET
Basophils Absolute: 0 10*3/uL (ref 0–0.1)
Basophils Relative: 0 %
EOS ABS: 0.1 10*3/uL (ref 0–0.7)
EOS PCT: 0 %
HCT: 27.7 % — ABNORMAL LOW (ref 40.0–52.0)
Hemoglobin: 8.7 g/dL — ABNORMAL LOW (ref 13.0–18.0)
LYMPHS ABS: 1.2 10*3/uL (ref 1.0–3.6)
Lymphocytes Relative: 5 %
MCH: 28.8 pg (ref 26.0–34.0)
MCHC: 31.3 g/dL — AB (ref 32.0–36.0)
MCV: 91.9 fL (ref 80.0–100.0)
MONO ABS: 0.7 10*3/uL (ref 0.2–1.0)
MONOS PCT: 3 %
Neutro Abs: 23 10*3/uL — ABNORMAL HIGH (ref 1.4–6.5)
Neutrophils Relative %: 92 %
PLATELETS: 346 10*3/uL (ref 150–440)
RBC: 3.01 MIL/uL — ABNORMAL LOW (ref 4.40–5.90)
RDW: 17.3 % — AB (ref 11.5–14.5)
WBC: 25 10*3/uL — ABNORMAL HIGH (ref 3.8–10.6)

## 2015-08-14 LAB — MAGNESIUM: MAGNESIUM: 2.2 mg/dL (ref 1.7–2.4)

## 2015-08-14 LAB — BASIC METABOLIC PANEL
Anion gap: 17 — ABNORMAL HIGH (ref 5–15)
BUN: 87 mg/dL — AB (ref 6–20)
CALCIUM: 9.3 mg/dL (ref 8.9–10.3)
CHLORIDE: 96 mmol/L — AB (ref 101–111)
CO2: 24 mmol/L (ref 22–32)
CREATININE: 8.94 mg/dL — AB (ref 0.61–1.24)
GFR calc Af Amer: 5 mL/min — ABNORMAL LOW (ref >60)
GFR calc non Af Amer: 5 mL/min — ABNORMAL LOW (ref >60)
Glucose, Bld: 142 mg/dL — ABNORMAL HIGH (ref 65–99)
Potassium: 5.1 mmol/L (ref 3.5–5.1)
SODIUM: 137 mmol/L (ref 135–145)

## 2015-08-14 LAB — PHOSPHORUS: Phosphorus: 7.3 mg/dL — ABNORMAL HIGH (ref 2.5–4.6)

## 2015-08-14 LAB — POTASSIUM (ARMC VASCULAR LAB ONLY): POTASSIUM (ARMC VASCULAR LAB): 5.3 — AB (ref 3.5–5.1)

## 2015-08-14 LAB — CREATININE, SERUM
Creatinine, Ser: 8.81 mg/dL — ABNORMAL HIGH (ref 0.61–1.24)
GFR calc Af Amer: 6 mL/min — ABNORMAL LOW (ref >60)
GFR, EST NON AFRICAN AMERICAN: 5 mL/min — AB (ref >60)

## 2015-08-14 LAB — MRSA PCR SCREENING: MRSA BY PCR: NEGATIVE

## 2015-08-14 LAB — TROPONIN I
TROPONIN I: 0.28 ng/mL — AB (ref <0.031)
TROPONIN I: 1.97 ng/mL — AB (ref <0.031)

## 2015-08-14 SURGERY — ABDOMINAL AORTOGRAM W/LOWER EXTREMITY
Wound class: Clean

## 2015-08-14 MED ORDER — FENTANYL CITRATE (PF) 100 MCG/2ML IJ SOLN: 50.0000 ug | Freq: Once | INTRAMUSCULAR | Status: DC

## 2015-08-14 MED ORDER — CLINDAMYCIN PHOSPHATE 300 MG/50ML IV SOLN
300.0000 mg | Freq: Once | INTRAVENOUS | Status: AC
  Administered 2015-08-14: 300 mg via INTRAVENOUS

## 2015-08-14 MED ORDER — EPINEPHRINE HCL 0.1 MG/ML IJ SOSY
PREFILLED_SYRINGE | INTRAMUSCULAR | Status: AC
  Filled 2015-08-14: qty 10

## 2015-08-14 MED ORDER — PANTOPRAZOLE SODIUM 40 MG IV SOLR
40.0000 mg | Freq: Every day | INTRAVENOUS | Status: DC
  Administered 2015-08-14 – 2015-08-15 (×2): 40 mg via INTRAVENOUS
  Filled 2015-08-14 (×2): qty 40

## 2015-08-14 MED ORDER — SENNOSIDES 8.8 MG/5ML PO SYRP: 5.0000 mL | ORAL_SOLUTION | Freq: Two times a day (BID) | ORAL | Status: DC

## 2015-08-14 MED ORDER — HEPARIN SODIUM (PORCINE) 1000 UNIT/ML IJ SOLN
INTRAMUSCULAR | Status: DC | PRN
  Administered 2015-08-14: 5000 [IU] via INTRAVENOUS

## 2015-08-14 MED ORDER — DOPAMINE-DEXTROSE 3.2-5 MG/ML-% IV SOLN: 0.0000 ug/kg/min | INTRAVENOUS | Status: DC

## 2015-08-14 MED ORDER — ANTISEPTIC ORAL RINSE SOLUTION (CORINZ)
7.0000 mL | Freq: Four times a day (QID) | OROMUCOSAL | Status: DC
  Administered 2015-08-14 – 2015-08-19 (×21): 7 mL via OROMUCOSAL
  Filled 2015-08-14 (×23): qty 7

## 2015-08-14 MED ORDER — IPRATROPIUM-ALBUTEROL 0.5-2.5 (3) MG/3ML IN SOLN
3.0000 mL | Freq: Once | RESPIRATORY_TRACT | Status: AC
  Administered 2015-08-14: 3 mL via RESPIRATORY_TRACT
  Filled 2015-08-14: qty 3

## 2015-08-14 MED ORDER — HEPARIN SODIUM (PORCINE) 5000 UNIT/ML IJ SOLN
5000.0000 [IU] | Freq: Two times a day (BID) | INTRAMUSCULAR | Status: DC
  Administered 2015-08-14 – 2015-08-24 (×21): 5000 [IU] via SUBCUTANEOUS
  Filled 2015-08-14 (×22): qty 1

## 2015-08-14 MED ORDER — INSULIN ASPART 100 UNIT/ML ~~LOC~~ SOLN
0.0000 [IU] | SUBCUTANEOUS | Status: DC
  Administered 2015-08-14: 2 [IU] via SUBCUTANEOUS
  Administered 2015-08-14: 3 [IU] via SUBCUTANEOUS
  Administered 2015-08-14 – 2015-08-18 (×9): 2 [IU] via SUBCUTANEOUS
  Filled 2015-08-14 (×8): qty 2
  Filled 2015-08-14: qty 3
  Filled 2015-08-14: qty 1
  Filled 2015-08-14 (×2): qty 2

## 2015-08-14 MED ORDER — MIDAZOLAM HCL 2 MG/2ML IJ SOLN
INTRAMUSCULAR | Status: DC | PRN
Start: 2015-08-14 — End: 2015-08-14
  Administered 2015-08-14: 2 mg via INTRAVENOUS
  Administered 2015-08-14: 1 mg via INTRAVENOUS

## 2015-08-14 MED ORDER — SODIUM CHLORIDE 0.9% FLUSH
10.0000 mL | INTRAVENOUS | Status: DC | PRN
  Administered 2015-08-15 – 2015-08-23 (×2): 10 mL
  Filled 2015-08-14 (×2): qty 40

## 2015-08-14 MED ORDER — LIDOCAINE-EPINEPHRINE (PF) 1 %-1:200000 IJ SOLN
INTRAMUSCULAR | Status: DC | PRN
  Administered 2015-08-14: 10 mL via INTRADERMAL

## 2015-08-14 MED ORDER — IPRATROPIUM-ALBUTEROL 0.5-2.5 (3) MG/3ML IN SOLN: 3.0000 mL | RESPIRATORY_TRACT | Status: DC

## 2015-08-14 MED ORDER — SODIUM CHLORIDE 0.9 % IV SOLN
INTRAVENOUS | Status: DC
  Administered 2015-08-14: 08:00:00 via INTRAVENOUS

## 2015-08-14 MED ORDER — SODIUM CHLORIDE FLUSH 0.9 % IV SOLN
INTRAVENOUS | Status: AC
  Filled 2015-08-14: qty 50

## 2015-08-14 MED ORDER — FENTANYL CITRATE (PF) 100 MCG/2ML IJ SOLN: 50.0000 ug | INTRAMUSCULAR | Status: DC | PRN

## 2015-08-14 MED ORDER — SODIUM CHLORIDE 0.9 % IV BOLUS (SEPSIS)
INTRAVENOUS | Status: DC | PRN
  Administered 2015-08-14: 250 mL via INTRAVENOUS

## 2015-08-14 MED ORDER — FLUMAZENIL 0.5 MG/5ML IV SOLN
INTRAVENOUS | Status: DC | PRN
  Administered 2015-08-14: 0.5 mg via INTRAVENOUS

## 2015-08-14 MED ORDER — CHLORHEXIDINE GLUCONATE 0.12% ORAL RINSE (MEDLINE KIT)
15.0000 mL | Freq: Two times a day (BID) | OROMUCOSAL | Status: DC
  Administered 2015-08-14 – 2015-08-20 (×12): 15 mL via OROMUCOSAL
  Filled 2015-08-14 (×15): qty 15

## 2015-08-14 MED ORDER — NOREPINEPHRINE BITARTRATE 1 MG/ML IV SOLN
0.0000 ug/min | INTRAVENOUS | Status: DC
  Administered 2015-08-14: 25 ug/min via INTRAVENOUS
  Administered 2015-08-15: 10 ug/min via INTRAVENOUS
  Administered 2015-08-15: 15 ug/min via INTRAVENOUS
  Administered 2015-08-16: 12 ug/min via INTRAVENOUS
  Administered 2015-08-18: 7 ug/min via INTRAVENOUS
  Administered 2015-08-20 (×2): 2 ug/min via INTRAVENOUS
  Filled 2015-08-14 (×6): qty 16

## 2015-08-14 MED ORDER — SODIUM CHLORIDE 0.9% FLUSH: 3.0000 mL | Freq: Two times a day (BID) | INTRAVENOUS | Status: DC

## 2015-08-14 MED ORDER — HYDROMORPHONE HCL 1 MG/ML IJ SOLN: 1.0000 mg | Freq: Once | INTRAMUSCULAR | Status: DC

## 2015-08-14 MED ORDER — FENTANYL CITRATE (PF) 100 MCG/2ML IJ SOLN
50.0000 ug | INTRAMUSCULAR | Status: DC | PRN
  Administered 2015-08-17 – 2015-08-19 (×4): 50 ug via INTRAVENOUS
  Filled 2015-08-14 (×4): qty 2

## 2015-08-14 MED ORDER — ALBUTEROL SULFATE (2.5 MG/3ML) 0.083% IN NEBU
2.5000 mg | INHALATION_SOLUTION | RESPIRATORY_TRACT | Status: DC | PRN
  Administered 2015-08-20: 2.5 mg via RESPIRATORY_TRACT
  Filled 2015-08-14 (×2): qty 3

## 2015-08-14 MED ORDER — FLUMAZENIL 0.5 MG/5ML IV SOLN
INTRAVENOUS | Status: AC
  Filled 2015-08-14: qty 10

## 2015-08-14 MED ORDER — ENOXAPARIN SODIUM 40 MG/0.4ML ~~LOC~~ SOLN: 40.0000 mg | SUBCUTANEOUS | Status: DC

## 2015-08-14 MED ORDER — DEXTROSE 5 % IV SOLN
2.0000 ug/min | INTRAVENOUS | Status: DC
  Administered 2015-08-14: 10 ug/min via INTRAVENOUS
  Filled 2015-08-14 (×2): qty 4

## 2015-08-14 MED ORDER — MIDAZOLAM HCL 5 MG/5ML IJ SOLN
INTRAMUSCULAR | Status: AC
  Filled 2015-08-14: qty 5

## 2015-08-14 MED ORDER — HYDROCOD POLST-CPM POLST ER 10-8 MG/5ML PO SUER
ORAL | Status: DC | PRN
  Administered 2015-08-14: 5 mL via ORAL

## 2015-08-14 MED ORDER — NALOXONE HCL 2 MG/2ML IJ SOSY
PREFILLED_SYRINGE | INTRAMUSCULAR | Status: AC
  Filled 2015-08-14: qty 4

## 2015-08-14 MED ORDER — CLINDAMYCIN PHOSPHATE 300 MG/50ML IV SOLN
INTRAVENOUS | Status: AC
  Filled 2015-08-14: qty 50

## 2015-08-14 MED ORDER — SODIUM CHLORIDE 0.9 % IV SOLN: INTRAVENOUS | Status: DC

## 2015-08-14 MED ORDER — ALBUTEROL SULFATE (2.5 MG/3ML) 0.083% IN NEBU: 2.5000 mg | INHALATION_SOLUTION | RESPIRATORY_TRACT | Status: DC

## 2015-08-14 MED ORDER — INSULIN ASPART 100 UNIT/ML ~~LOC~~ SOLN: 0.0000 [IU] | Freq: Three times a day (TID) | SUBCUTANEOUS | Status: DC

## 2015-08-14 MED ORDER — HYDROCOD POLST-CPM POLST ER 10-8 MG/5ML PO SUER
ORAL | Status: AC
  Filled 2015-08-14: qty 5

## 2015-08-14 MED ORDER — FENTANYL CITRATE (PF) 100 MCG/2ML IJ SOLN
INTRAMUSCULAR | Status: AC
  Filled 2015-08-14: qty 2

## 2015-08-14 MED ORDER — FAMOTIDINE 20 MG PO TABS: 40.0000 mg | ORAL_TABLET | ORAL | Status: DC | PRN

## 2015-08-14 MED ORDER — IPRATROPIUM-ALBUTEROL 0.5-2.5 (3) MG/3ML IN SOLN
3.0000 mL | Freq: Four times a day (QID) | RESPIRATORY_TRACT | Status: DC
  Administered 2015-08-14 – 2015-08-20 (×23): 3 mL via RESPIRATORY_TRACT
  Filled 2015-08-14 (×24): qty 3

## 2015-08-14 MED ORDER — NALOXONE HCL 0.4 MG/ML IJ SOLN: 0.4000 mg | INTRAMUSCULAR | Status: DC | PRN

## 2015-08-14 MED ORDER — SODIUM CHLORIDE 0.9% FLUSH
10.0000 mL | Freq: Two times a day (BID) | INTRAVENOUS | Status: DC
  Administered 2015-08-14: 10 mL
  Administered 2015-08-15: 30 mL
  Administered 2015-08-15 – 2015-08-16 (×2): 10 mL
  Administered 2015-08-16: 30 mL
  Administered 2015-08-17 – 2015-08-23 (×13): 10 mL
  Administered 2015-08-23: 30 mL
  Administered 2015-08-24 (×2): 10 mL

## 2015-08-14 MED ORDER — HEPARIN SODIUM (PORCINE) 1000 UNIT/ML IJ SOLN
INTRAMUSCULAR | Status: AC
  Filled 2015-08-14: qty 1

## 2015-08-14 MED ORDER — HEPARIN (PORCINE) IN NACL 2-0.9 UNIT/ML-% IJ SOLN
INTRAMUSCULAR | Status: AC
  Filled 2015-08-14: qty 1000

## 2015-08-14 MED ORDER — LIDOCAINE-EPINEPHRINE (PF) 1 %-1:200000 IJ SOLN
INTRAMUSCULAR | Status: AC
  Filled 2015-08-14: qty 30

## 2015-08-14 MED ORDER — FENTANYL CITRATE (PF) 100 MCG/2ML IJ SOLN
INTRAMUSCULAR | Status: DC | PRN
  Administered 2015-08-14 (×2): 50 ug via INTRAVENOUS

## 2015-08-14 MED ORDER — MIDAZOLAM HCL 2 MG/2ML IJ SOLN
1.0000 mg | INTRAMUSCULAR | Status: DC | PRN
Start: 2015-08-14 — End: 2015-08-19
  Filled 2015-08-14 (×2): qty 2

## 2015-08-14 MED ORDER — FENTANYL 2500MCG IN NS 250ML (10MCG/ML) PREMIX INFUSION: 25.0000 ug/h | INTRAVENOUS | Status: DC

## 2015-08-14 MED ORDER — IOHEXOL 300 MG/ML  SOLN
INTRAMUSCULAR | Status: DC | PRN
  Administered 2015-08-14: 70 mL via INTRA_ARTERIAL

## 2015-08-14 MED ORDER — ENOXAPARIN SODIUM 30 MG/0.3ML ~~LOC~~ SOLN: 30.0000 mg | SUBCUTANEOUS | Status: DC

## 2015-08-14 MED ORDER — ALTEPLASE 2 MG IJ SOLR
INTRAMUSCULAR | Status: AC
  Filled 2015-08-14: qty 4

## 2015-08-14 MED ORDER — METHYLPREDNISOLONE SODIUM SUCC 125 MG IJ SOLR: 125.0000 mg | INTRAMUSCULAR | Status: DC | PRN

## 2015-08-14 MED ORDER — MIDAZOLAM HCL 2 MG/2ML IJ SOLN
1.0000 mg | INTRAMUSCULAR | Status: DC | PRN
Start: 2015-08-14 — End: 2015-08-19
  Administered 2015-08-15 – 2015-08-17 (×3): 1 mg via INTRAVENOUS
  Filled 2015-08-14: qty 2

## 2015-08-14 MED ORDER — METHYLPREDNISOLONE SODIUM SUCC 125 MG IJ SOLR: 60.0000 mg | Freq: Two times a day (BID) | INTRAMUSCULAR | Status: DC

## 2015-08-14 MED ORDER — ONDANSETRON HCL 4 MG/2ML IJ SOLN: 4.0000 mg | Freq: Four times a day (QID) | INTRAMUSCULAR | Status: DC | PRN

## 2015-08-14 MED ORDER — FENTANYL BOLUS VIA INFUSION
25.0000 ug | INTRAVENOUS | Status: DC | PRN
  Filled 2015-08-14: qty 25

## 2015-08-14 MED ORDER — INSULIN ASPART 100 UNIT/ML ~~LOC~~ SOLN: 0.0000 [IU] | Freq: Every day | SUBCUTANEOUS | Status: DC

## 2015-08-14 MED ORDER — SODIUM CHLORIDE 0.9 % IV BOLUS (SEPSIS)
500.0000 mL | Freq: Once | INTRAVENOUS | Status: AC
  Administered 2015-08-14: 500 mL via INTRAVENOUS

## 2015-08-14 MED ORDER — BISACODYL 10 MG RE SUPP: 10.0000 mg | Freq: Every day | RECTAL | Status: DC | PRN

## 2015-08-14 MED FILL — Medication: Qty: 1 | Status: AC

## 2015-08-14 SURGICAL SUPPLY — 25 items
BALLN LUTONIX 5X150X130 (BALLOONS) ×5
BALLN LUTONIX DCB 5X80X130 (BALLOONS) ×5
BALLN ULTRVRSE 2.5X300X150 (BALLOONS) ×5
BALLN ULTRVRSE 3X150X150 (BALLOONS) ×5
BALLOON LUTONIX 5X150X130 (BALLOONS) ×3 IMPLANT
BALLOON LUTONIX DCB 5X80X130 (BALLOONS) ×3 IMPLANT
BALLOON ULTRVRSE 2.5X300X150 (BALLOONS) ×3 IMPLANT
BALLOON ULTRVRSE 3X150X150 (BALLOONS) ×3 IMPLANT
CATH CXI SUPP ANG 4FR 135 (MICROCATHETER) ×6 IMPLANT
CATH CXI SUPP ANG 4FR 135CM (MICROCATHETER) ×10
CATH PIG 70CM (CATHETERS) ×5 IMPLANT
CATH VERT 100CM (CATHETERS) ×5 IMPLANT
DEVICE PRESTO INFLATION (MISCELLANEOUS) ×5 IMPLANT
DEVICE SOLENT OMNI 120CM (CATHETERS) ×5 IMPLANT
DEVICE STARCLOSE SE CLOSURE (Vascular Products) ×5 IMPLANT
DEVICE TORQUE (MISCELLANEOUS) ×5 IMPLANT
GLIDEWIRE ADV .035X260CM (WIRE) ×5 IMPLANT
KIT CATH CVC 3 LUMEN 7FR 8IN (MISCELLANEOUS) ×5 IMPLANT
PACK ANGIOGRAPHY (CUSTOM PROCEDURE TRAY) ×5 IMPLANT
SHEATH ANL2 6FRX45 HC (SHEATH) ×5 IMPLANT
SHEATH BRITE TIP 5FRX11 (SHEATH) ×5 IMPLANT
SYR MEDRAD MARK V 150ML (SYRINGE) ×5 IMPLANT
TUBING CONTRAST HIGH PRESS 72 (TUBING) ×5 IMPLANT
WIRE G V18X300CM (WIRE) ×5 IMPLANT
WIRE J 3MM .035X145CM (WIRE) ×5 IMPLANT

## 2015-08-14 NOTE — Op Note (Signed)
[name] Percutaneous Study/Intervention Procedural Note   Date of Surgery: 08/14/2015  Surgeon(s):Darien Kading   Assistants:none  Pre-operative Diagnosis: PAD with ulceration of the left lower extremity, end-stage renal disease, heart disease with cardiac arrhythmias requiring pacemaker  Post-operative diagnosis: Same  Procedure(s) Performed: 1. Ultrasound guidance for vascular access right femoral artery 2. Catheter placement into left peroneal artery and anterior tibial artery from right femoral approach 3. Aortogram and selective left lower extremity angiogram 4. Percutaneous transluminal angioplasty of the distal superficial femoral artery and above-knee popliteal artery with two 5 mm diameter Lutonix drug-coated angioplasty balloons 5. Percutaneous transluminal angioplasty of the tibioperoneal trunk and proximal peroneal artery with a 3 mm diameter and plasty balloon and percutaneous transluminal angioplasty of the peroneal artery to the distal segment with a 2.5 mm diameter angioplasty balloon  6.  Catheter directed thrombolysis with 4 mg of TPA delivered with the AngioJet Omni catheter to the tibioperoneal trunk and peroneal artery  7.  Mechanical rheolytic thrombectomy of the left tibioperoneal trunk and peroneal artery using the AngioJet Omni catheter 8. StarClose closure device right femoral artery  EBL: Minimal  Contrast: 70 cc  Fluro Time: 11.3 minutes  Moderate Conscious Sedation Time: approximately 60 minutes using 3 mg of Versed and 100 mcg of Fentanyl  Indications: Patient is a 82 y.o.male with a nonhealing ulcer of the left lower extremity and peripheral arterial disease as well as many other comorbidities limiting wound healing. The patient has noninvasive study showing SFA and popliteal stenosis as well as significant tibial disease. The patient is  brought in for angiography for further evaluation and potential treatment. Risks and benefits are discussed and informed consent is obtained  Procedure: The patient was identified and appropriate procedural time out was performed. The patient was then placed supine on the table and prepped and draped in the usual sterile fashion.Moderate conscious sedation was administered during a face to face encounter with the patient throughout the procedure with my supervision of the RN administering medicines and monitoring the patient's vital signs, pulse oximetry, telemetry and mental status throughout from the start of the procedure until the patient was taken to the recovery room. Ultrasound was used to evaluate the right common femoral artery. It was patent . A digital ultrasound image was acquired. A Seldinger needle was used to access the right common femoral artery under direct ultrasound guidance and a permanent image was performed. A 0.035 J wire was advanced without resistance and a 5Fr sheath was placed. Pigtail catheter was placed into the aorta and an AP aortogram was performed. This demonstrated left common iliac artery aneurysm, calcific aorta, sluggish renal artery flow. I then crossed the aortic bifurcation and advanced to the left femoral head and into the proximal left superficial femoral artery due to slow circulation time distally. Selective left lower extremity angiogram was then performed. This demonstrated moderate to high-grade tandem lesions at Hunter's canal and into the above-knee popliteal artery in the 70-80% range for both. 80% stenosis of the tibioperoneal trunk with peroneal runoff distally. Occlusion of the anterior tibial artery with reconstitution of the anterior tibial artery in the lower leg. The patient was systemically heparinized and a 6 [place] Ansell sheath was then placed over the [employer] wire. I then used a Kumpe catheter and the advantage wire to navigate through  the SFA and popliteal stenoses and confirm intraluminal flow in the popliteal artery below the knee. I exchanged for a 0.018 wire and cannulated the anterior tibial  artery with selective imaging performed, but was unable to cross the anterior tibial artery occlusion. This was abandoned after several minutes of attempting this. I then advanced into the peroneal artery and cross the tibioperoneal trunk stenosis and proximal peroneal artery and confirm intraluminal flow with a Kumpe catheter in the peroneal artery. I then replaced a 0.018 wire. Angioplasty was performed of the tibioperoneal trunk and proximal peroneal artery with a 3 mm diameter by 15 cm length angioplasty balloon inflated to 10 atm for 1 minute. Attention was turned to the popliteal artery and distal SFA. The tandem lesions were too long to be treated with a single Lutonix balloon, sutured to Lutonix drug-coated angioplasty balloons were used to treat these lesions. The first was 5 mm in diameter by 15 cm length and the second was 5 mm in diameter by 8 cm in length. Each inflation was from 8-10 atm and held for 1 minute. Completion angiogram of the popliteal artery after inflation showed less than 30% residual stenosis in both lesions. However, there was occlusion of the tibioperoneal trunk and proximal peroneal artery from what appeared to be thrombus. The patient had already been heparinized. I took the AngioJet Omni catheter and delivered 4 mg of TPA to the tibioperoneal trunk and peroneal artery and a power pulse spray fashion. This was allowed to dwell for approximately 10-15 minutes. Mechanical rheolytic thrombectomy was performed for a little over 100 cc of effluent returned. This resulted in significant improvement of the thrombus within the peroneal artery, but there was diffuse narrowing throughout and it was difficult to discern if this was some distal thrombus, stenosis, or spasm. I treated the peroneal artery to its distal segment with a  2.5 mm diameter angioplasty balloon. During the last several minutes of our procedure, the patient became increasingly hypoxic with worsening blood pressure is well. Despite reversal with Narcan and flumazenil, the patient became unresponsive and required ventilatory support.  Electrical activity remained on the monitor, but when he lost a pulse he was given approximately 2 minutes of CPR with chest compressions during bag mask ventilation. He received 1 mg of epinephrine. He was then intubated for airway support and stabilization and his oxygen levels returned to 100%. His blood pressure also improved after the milligram of epinephrine, reversal of sedation, and CPR could be stopped. At this point, we had significantly improved his lower extremity perfusion although we had not performed completion images and a had not gotten across the anterior tibial occlusion. He was not stable to continue the procedure at this point. I elected to terminate the procedure. The sheath was removed and StarClose closure device was deployed in the left femoral artery with excellent hemostatic result. The patient was taken to the critical care unit in critical condition.  Findings:  Aortogram: left common iliac artery aneurysm, calcific aorta, sluggish renal artery flow Left Lower Extremity: Moderate to high-grade tandem lesions at Hunter's canal and into the above-knee popliteal artery in the 70-80% range for both. 80% stenosis of the tibioperoneal trunk with peroneal runoff distally which was diffusely diseased. Occlusion of the anterior tibial artery with reconstitution of the anterior tibial artery in the lower leg.   Disposition: Patient was taken to the recovery room in stable condition having tolerated the procedure well.  Complications: None  [name] 08/14/2015 10:57 AM

## 2015-08-14 NOTE — Op Note (Signed)
[facility]   PROCEDURE NOTE  PROCEDURE: 1. Left femoral vein central venous catheter placement 2. Left femoral vein cannulation under ultrasound guidance  PRE-OPERATIVE DIAGNOSIS: hypoxia, hypotension, instability during angiogram being performed for limb threatening ischemia of the left leg  POST-OPERATIVE DIAGNOSIS: same as above  SURGEON: Elber Galyean, MD  ANESTHESIA:  None  ESTIMATED BLOOD LOSS: minimal  FINDING(S): none  SPECIMEN(S):  none  INDICATIONS:   JAMAIR CATO is a 82 y.o. male who presents with need for venous access du to hypoxia and hypotension during an angiogram.  This is done urgently for venous access.  DESCRIPTION: During the procedure for his lower extremity ischemia, the patient became hypoxic and hypotensive.  The patient was already prepped with chloraprep and draped in the standard fashion for a groin access and now a left femoral central venous catheter placement.  the left femoral  vein was cannulated with the 18 gauge needle.  A J wire was then placed.  After a skin nick and dilatation, the triple lumen central venous catheter was placed over the wire and the wire was removed.  Each port was aspirated and flushed with sterile normal saline.  The catheter was secured in placed with three interrupted stitches of 3-0 Silk tied to the catheter.  The catheter was dressed with sterile dressing.    COMPLICATIONS: none apparent  CONDITION: stable  Keyen Marban 08/14/2015, 11:14 AM

## 2015-08-14 NOTE — Progress Notes (Signed)
*  PRELIMINARY RESULTS* Echocardiogram 2D Echocardiogram has been performed.  Georgann Housekeeper Hege 08/14/2015, 3:10 PM

## 2015-08-14 NOTE — Consult Note (Addendum)
[facility]INIC CARDIOLOGY [facility]  CARDIOLOGY CONSULT NOTE  Patient ID: [name] MRN: [id number] DOB/AGE: 1928-06-30 82 y.o.  Admit date: 08/14/2015 Referring Physician Dr. Juliene Pina Primary Physician   Primary Cardiologist Dr. Lady Gary Reason for Consultation cardiac arrest  HPI: Pt is a 82 yo male with history of esrd on home hd, history of cad sp cabg with a ppm in place for high grade heart block who was undergoing a complex angioplasty to lle when he suffered hypoxemia followed by a PEA. He is currently intubated and unresponsive. Telemetry reveals ventricular paced rhythm. Labs revealed troponin of 0.28.No obvious ischemia at presetn  Review of Systems  Unable to perform ROS: medical condition    Past Medical History  Diagnosis Date  . Renal disorder   . Stroke (HCC)   . Coronary artery disease   . Myocardial infarction (HCC)   . Parkinson disease (HCC)   . Chronic home hemodialysis status (HCC)     History reviewed. No pertinent family history.  Social History   Social History  . Marital Status: Married    Spouse Name: N/A  . Number of Children: N/A  . Years of Education: N/A   Occupational History  . Not on file.   Social History Main Topics  . Smoking status: Former Smoker    Quit date: 05/24/1969  . Smokeless tobacco: Not on file  . Alcohol Use: No  . Drug Use: No  . Sexual Activity: No   Other Topics Concern  . Not on file   Social History Narrative    Past Surgical History  Procedure Laterality Date  . Pacemaker insertion    . Orif ankle fracture Left 05/31/2015    Procedure: OPEN REDUCTION INTERNAL FIXATION TRIMALLEOLAR IANKLE FRACTURE DISLOCATION;  Surgeon: Christena Flake, MD;  Location: [facility] ORS;  Service: Orthopedics;  Laterality: Left;  . Peripheral vascular catheterization N/A 07/27/2015    Procedure: A/V Shuntogram/Fistulagram;  Surgeon: Annice Needy, MD;  Location: [facility] INVASIVE CV LAB;  Service: Cardiovascular;  Laterality: N/A;  .  Peripheral vascular catheterization N/A 07/27/2015    Procedure: A/V Shunt Intervention;  Surgeon: Annice Needy, MD;  Location: [facility] INVASIVE CV LAB;  Service: Cardiovascular;  Laterality: N/A;     Prescriptions prior to admission  Medication Sig Dispense Refill Last Dose  . aspirin EC 325 MG tablet Take 325 mg by mouth 2 (two) times daily.    08/13/2015 at Unknown time  . calcium acetate (PHOSLO) 667 MG capsule Take 2 capsules (1,334 mg total) by mouth 3 (three) times daily with meals. 90 capsule 0 08/13/2015 at Unknown time  . carbidopa-levodopa (SINEMET IR) 25-100 MG tablet Take 1 tablet by mouth 2 (two) times daily.  2 08/13/2015 at Unknown time  . carvedilol (COREG) 3.125 MG tablet Take 1 tablet (3.125 mg total) by mouth 2 (two) times daily with a meal. 60 tablet 0 08/13/2015 at Unknown time  . collagenase (SANTYL) ointment Apply 1 application topically daily.   08/13/2015 at Unknown time  . cyanocobalamin 1000 MCG tablet Take 1,000 mcg by mouth daily.   08/13/2015 at Unknown time  . guaifenesin (ROBITUSSIN) 100 MG/5ML syrup Take 200 mg by mouth 3 (three) times daily as needed for cough.   08/13/2015 at Unknown time  . lovastatin (MEVACOR) 20 MG tablet Take 20 mg by mouth every evening.   08/13/2015 at Unknown time  . midodrine (PROAMATINE) 10 MG tablet Take 1 tablet by mouth every dialysis.  3 08/13/2015  at Unknown time  . Multiple Vitamins-Minerals (RENAPLEX-D PO) Take 1 tablet by mouth every morning.   08/13/2015 at Unknown time  . Nutritional Supplements (FEEDING SUPPLEMENT, NEPRO CARB STEADY,) LIQD Take 237 mLs by mouth daily. 237 mL 0 08/13/2015 at Unknown time  . omeprazole (PRILOSEC) 40 MG capsule Take 1 capsule by mouth daily.  0 08/13/2015 at Unknown time  . oxyCODONE (OXY IR/ROXICODONE) 5 MG immediate release tablet Take 1-2 tablets (5-10 mg total) by mouth every 4 (four) hours as needed for moderate pain. 30 tablet 0 08/13/2015 at Unknown time  . SENSIPAR 30 MG tablet Take 1 tablet by mouth  daily.  10 08/13/2015 at Unknown time  . azithromycin (ZITHROMAX) 250 MG tablet Take 250 mg by mouth daily. Reported on 08/14/2015   Completed Course at Unknown time  . gemfibrozil (LOPID) 600 MG tablet Take 1 tablet by mouth 2 (two) times daily. Reported on 08/14/2015  2 Not Taking at Unknown time    Physical Exam: Blood pressure 135/45, pulse 63, temperature 98.2 F (36.8 C), temperature source Oral, resp. rate 15, height  (1.753 m), weight 98.2 kg (216 lb 7.9 oz), SpO2 100 %.   Wt Readings from Last 1 Encounters:  08/14/15 98.2 kg (216 lb 7.9 oz)     General appearance: intubated Head: Normocephalic, without obvious abnormality, atraumatic Resp: diminished breath sounds bilaterally Cardio: regular rate and rhythm GI: normal findings: no bruits heard Extremities: edema 2 plus edema bilaterally Neurologic: Mental status: intubated and sedated  Labs:   Lab Results  Component Value Date   WBC 25.0* 08/14/2015   HGB 8.7* 08/14/2015   HCT 27.7* 08/14/2015   MCV 91.9 08/14/2015   PLT 346 08/14/2015    Recent Labs Lab 08/14/15 1235 08/14/15 1236  NA 137  --   K 5.1  --   CL 96*  --   CO2 24  --   BUN 87*  --   CREATININE 8.94* 8.81*  CALCIUM 9.3  --   PROT  --  7.0  BILITOT  --  1.2  ALKPHOS  --  98  ALT  --  6*  AST  --  34  GLUCOSE 142*  --    Lab Results  Component Value Date   TROPONINI 0.28* 08/14/2015      Radiology: no infiltrate or edema EKG: vpaced rhythm  ASSESSMENT AND PLAN:  Pt with history of cad s/p cabg, histroy of esrd who sufferred a pea arrest during a revascularization procedure on his lower extremety. Currently intubated and unresponsive. Relatively hemodynamically stable at present.. Echo revealed preserved lv function. Head ct did not reveal any acute changes. Etiology of arrest is unclear. Initial troponin was 0.28 which is consistant with arrest and acls. Would conitnue to support pressure and ventilation. Wean vent as tolerated and assess  brain funciotn. Will follow with you.  Signed: Dalia Heading MD, [facility] 08/14/2015, 8:34 PM

## 2015-08-14 NOTE — Progress Notes (Signed)
Subjective:  Patient underwent vascular procedure today Aortogram: left common iliac artery aneurysm, calcific aorta, sluggish renal artery flow Left Lower Extremity: Moderate to high-grade tandem lesions at Hunter's canal and into the above-knee popliteal artery in the 70-80% range for both. 80% stenosis of the tibioperoneal trunk with peroneal runoff distally which was diffusely diseased. Occlusion of the anterior tibial artery with reconstitution of the anterior tibial artery in the lower leg.  Patient known to our practice from outpatient dialysis   Objective:  Vital signs in last 24 hours:  Temp:  [97[name]5 F (36[name]4 C)-97[name]8 F (36[name]6 C)] 97[name]5 F (36[name]4 C) (02/20 1430) Pulse Rate:  [58-65] 64 (02/20 1545) Resp:  [8-20] 17 (02/20 1545) BP: (85-132)/(38-62) 127/48 mmHg (02/20 1545) SpO2:  [98 %-100 %] 100 % (02/20 1545) FiO2 (%):  [40 %-50 %] 40 % (02/20 1500) Weight:  [94[name]802 kg (209 lb)] 94[name]802 kg (209 lb) (02/20 0816)  Weight change:  Filed Weights   08/14/15 0816  Weight: 94[name]802 kg (209 lb)    Intake/Output:    Intake/Output Summary (Last 24 hours) at 08/14/15 1604 Last data filed at 08/14/15 1500  Gross per 24 hour  Intake    257 ml  Output      0 ml  Net    257 ml     Physical Exam: General: Critically ill appearing  HEENT ETT, OGT, blood tinged suction tube  Neck No mass  Pulm/lungs Vent assisted  CVS/Heart Paced rhythm, regular  Abdomen:  Soft, NT, ND  Extremities: Trace to 1+ edema  Neurologic: sedated  Skin: eccymosis  Access: Left arm AVF       Basic Metabolic Panel:   Recent Labs Lab 08/14/15 1235 08/14/15 1236  NA 137  --   K 5[name]1  --   CL 96*  --   CO2 24  --   GLUCOSE 142*  --   BUN 87*  --   CREATININE 8[name]94* 8[name]81*  CALCIUM 9[name]3  --   MG  --  2[name]2  PHOS  --  7[name]3*     CBC:  Recent Labs Lab 08/14/15 1235  WBC 25[name]0*  NEUTROABS 23[name]0*  HGB 8[name]7*  HCT 27[name]7*  MCV 91[name]9  PLT 346      Microbiology:  Recent Results (from the  past 720 hour(s))  MRSA PCR Screening     Status: None   Collection Time: 08/14/15 12:35 PM  Result Value Ref Range Status   MRSA by PCR NEGATIVE NEGATIVE Final    Comment:        The GeneXpert MRSA Assay (FDA approved for NASAL specimens only), is one component of a comprehensive MRSA colonization surveillance program. It is not intended to diagnose MRSA infection nor to guide or monitor treatment for MRSA infections.     Coagulation Studies: No results for input(s): LABPROT, INR in the last 72 hours.  Urinalysis: No results for input(s): COLORURINE, LABSPEC, PHURINE, GLUCOSEU, HGBUR, BILIRUBINUR, KETONESUR, PROTEINUR, UROBILINOGEN, NITRITE, LEUKOCYTESUR in the last 72 hours.  Invalid input(s): APPERANCEUR    Imaging: US Venous Img Lower Bilateral  08/14/2015  CLINICAL DATA:  Cardiac arrest this morning. Clinical concern for DVT. EXAM: BILATERAL LOWER EXTREMITY VENOUS DOPPLER ULTRASOUND TECHNIQUE: Gray-scale sonography with graded compression, as well as color Doppler and duplex ultrasound were performed to evaluate the lower extremity deep venous systems from the level of the common femoral vein and including the common femoral, femoral, profunda femoral, popliteal and calf veins including the posterior tibial, peroneal and gastrocnemius  veins when visible. The superficial great saphenous vein was also interrogated. Spectral Doppler was utilized to evaluate flow at rest and with distal augmentation maneuvers in the common femoral, femoral and popliteal veins. COMPARISON:  None. FINDINGS: RIGHT LOWER EXTREMITY Common Femoral Vein: No evidence of thrombus. Normal compressibility, respiratory phasicity and response to augmentation. Saphenofemoral Junction: No evidence of thrombus. Normal compressibility and flow on color Doppler imaging. Profunda Femoral Vein: No evidence of thrombus. Normal compressibility and flow on color Doppler imaging. Femoral Vein: No evidence of thrombus. Normal  compressibility, respiratory phasicity and response to augmentation. Popliteal Vein: No evidence of thrombus. Normal compressibility, respiratory phasicity and response to augmentation. Calf Veins: No evidence of thrombus. Normal compressibility and flow on color Doppler imaging. Superficial Great Saphenous Vein: No evidence of thrombus. Normal compressibility and flow on color Doppler imaging. Venous Reflux:  None. Other Findings:  None. LEFT LOWER EXTREMITY Common Femoral Vein: No evidence of thrombus. Normal compressibility, respiratory phasicity and response to augmentation. Saphenofemoral Junction: No evidence of thrombus. Normal compressibility and flow on color Doppler imaging. Profunda Femoral Vein: No evidence of thrombus. Normal compressibility and flow on color Doppler imaging. Femoral Vein: No evidence of thrombus. Normal compressibility, respiratory phasicity and response to augmentation. Popliteal Vein: No evidence of thrombus. Normal compressibility, respiratory phasicity and response to augmentation. Calf Veins: No evidence of thrombus. Normal compressibility and flow on color Doppler imaging. Superficial Great Saphenous Vein: No evidence of thrombus. Normal compressibility and flow on color Doppler imaging. Venous Reflux:  None. Other Findings:  None. IMPRESSION: No evidence of deep venous thrombosis. Electronically Signed   By: Beckie Salts MWilliam BlackburnD.   On: 08/14/2015 15:00   Dg Chest Port 1 View  08/14/2015  CLINICAL DATA:  Endotracheal tube placement EXAM: PORTABLE CHEST 1 VIEW COMPARISON:  07/07/2014 and 01/11/2014 FINDINGS: Cardiomediastinal silhouette is stable. No infiltrate or pulmonary edema. There is a dual lead cardiac pacemaker with leads in right atrium and right ventricle. Mild bilateral basilar atelectasis. Endotracheal tube in place with tip 3[name]6 cm above the carina. There is no pneumothorax. IMPRESSION: No infiltrate or pulmonary edema. Mild basilar atelectasis. Two leads cardiac  pacemaker is unchanged in position. Endotracheal tube in place with tip 3[name]6 cm above the carina. No pneumothorax. Electronically Signed   By: Natasha Mead MWilliam BlackburnD.   On: 08/14/2015 13:10     Medications:   . norepinephrine (LEVOPHED) Adult infusion 28 mcg/min (08/14/15 1332)   . antiseptic oral rinse  7 mL Mouth Rinse QID  . chlorhexidine gluconate  15 mL Mouth Rinse BID  . chlorpheniramine-HYDROcodone      . clindamycin      . heparin subcutaneous  5,000 Units Subcutaneous Q12H  . insulin aspart  0-15 Units Subcutaneous 6 times per day  . ipratropium-albuterol  3 mL Nebulization Q6H  . pantoprazole (PROTONIX) IV  40 mg Intravenous Daily  . sodium chloride flush  10-40 mL Intracatheter Q12H   albuterol, fentaNYL (SUBLIMAZE) injection, fentaNYL (SUBLIMAZE) injection, midazolam, midazolam, sodium chloride flush  Assessment/ Plan:  82 yWilliam Blackburno. male [name], [name], [name], [name], [name],. 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 80% stenosis of the tibioperoneal trunk with peroneal runoff distally which was diffusely diseased. Occlusion of the anterior tibial artery with reconstitution of the anterior tibial artery  in the lower leg, hypoxic PEA arrest 08/14/15, Left ankle fracture: ORIF for left ankle. 12/7 Dr. Joice Lofts,   [facility] MWF Delena Serve Cheree Ditto  1. End Stage Renal Blackburn: MWF.  - will plan for HD tomorrow if hemodynamically stable  2.  Acute resp failure S/P hypoxic PEA arrest - vent assisted  3. Secondary Hyperparathyroidism  - monitor ca/phos this admission  4. Anemia of chronic kidney Blackburn: hemoglobin 8[name]7 - epo with hemodialysis treatment      LOS: 0 Paublo Warshawsky 2/20/20174:04 PM

## 2015-08-14 NOTE — Progress Notes (Signed)
Left leg angiogram performed. Upon arrival pt was wheezing with congested cough.  With md orders pt was given of tussionex.  Pt alert and oriented.  Vss. Soon into procedure, 100%NRB was placed on pt due to sats being in the low 90s, high 80s.  bolus of NS was given due to low BP.  Sats remained 100%.  Near the end of the case, sats decreased to low 90s.   Pt became unresponsive and I immediately started bagging the pt.  Code was called.  Narcan and romazicon was given.  Pt was intubated, epinephrine given, and CPR was started.  After 2 mins pt regained pulse and dopamine gtt was started.  Another dose of reversal agents given. Pt was transported to ICU and report given to [place], Charity fundraiser.

## 2015-08-14 NOTE — Progress Notes (Signed)
Patient is intubated during CPR without dfficulty. 7.5 mm ET tube placed using #4 mac blade. Tube secure at 24 cm at lip. Position verified by end tidal Co2 monitor and auscultation.

## 2015-08-14 NOTE — Progress Notes (Signed)
Responds to pain and moving mouth at times. Abnormal extension of arms and flexion of legs noted. V-paced per cardiac monitor. Anuric, plan for dialysis tomorrow if more stable. RN has been able to start titrating levophed drip down over the last of this shift.  Wife and son visited today.

## 2015-08-14 NOTE — H&P (Addendum)
[facility] [place] at [facility]   PATIENT NAME: [name]    MR#:  [id number]  DATE OF BIRTH:  1928/09/05  DATE OF ADMISSION:  08/14/2015  PRIMARY CARE PHYSICIAN: Lauro Regulus., MD   REQUESTING/REFERRING PHYSICIAN: Dr Wyn Quaker  CHIEF COMPLAINT:  PEA HISTORY OF PRESENT ILLNESS:  [name]  is a 82 y.o. male with a known history of end-stage renal disease on hemodialysis, PAD and Parkinson's disease  who presented for elective angiogram. According to the nurse patient had wheezing and cough upon arrival. He was placed on a nonrebreather due to low saturations. Near the end of the case, O2 sats are decreasing. Patient became unresponsive and was immediately bad. Patient was in PEA arrest. He was intubated, epinephrine given and CPR was started. After 2 minutes patient regained pulse and dopamine infusion was started.  PAST MEDICAL HISTORY:   Past Medical History  Diagnosis Date  . Renal disorder   . Stroke (HCC)   . Coronary artery disease   . Myocardial infarction (HCC)   . Parkinson disease (HCC)   . Chronic home hemodialysis status (HCC)     PAST SURGICAL HISTORY:   Past Surgical History  Procedure Laterality Date  . Pacemaker insertion    . Orif ankle fracture Left 05/31/2015    Procedure: OPEN REDUCTION INTERNAL FIXATION TRIMALLEOLAR IANKLE FRACTURE DISLOCATION;  Surgeon: Christena Flake, MD;  Location: [facility] ORS;  Service: Orthopedics;  Laterality: Left;  . Peripheral vascular catheterization N/A 07/27/2015    Procedure: A/V Shuntogram/Fistulagram;  Surgeon: Annice Needy, MD;  Location: [facility] INVASIVE CV LAB;  Service: Cardiovascular;  Laterality: N/A;  . Peripheral vascular catheterization N/A 07/27/2015    Procedure: A/V Shunt Intervention;  Surgeon: Annice Needy, MD;  Location: [facility] INVASIVE CV LAB;  Service: Cardiovascular;  Laterality: N/A;    SOCIAL HISTORY:   Social History  Substance Use Topics  . Smoking status: Former Smoker    Quit  date: 05/24/1969  . Smokeless tobacco: no  . Alcohol Use: No    FAMILY HISTORY:  Mother with hypertension father with CAD  DRUG ALLERGIES:   Allergies  Allergen Reactions  . Penicillins Swelling     REVIEW OF SYSTEMS:  Patient intubated  MEDICATIONS AT HOME:   Prior to Admission medications   Medication Sig Start Date End Date Taking? Authorizing Provider  aspirin EC 325 MG tablet Take 325 mg by mouth 2 (two) times Blackburn.  03/27/12  Yes Historical Provider, MD  calcium acetate (PHOSLO) 667 MG capsule Take 2 capsules (1,334 mg total) by mouth 3 (three) times Blackburn with meals. 06/02/15  Yes Lundon Verdejo, MD  carbidopa-levodopa (SINEMET IR) 25-100 MG tablet Take 1 tablet by mouth 2 (two) times Blackburn. 05/03/15  Yes Historical Provider, MD  carvedilol (COREG) 3.125 MG tablet Take 1 tablet (3.125 mg total) by mouth 2 (two) times Blackburn with a meal. 06/02/15  Yes Adrian Saran, MD  collagenase (SANTYL) ointment Apply 1 application topically Blackburn.   Yes Historical Provider, MD  cyanocobalamin 1000 MCG tablet Take 1,000 mcg by mouth Blackburn.   Yes Historical Provider, MD  guaifenesin (ROBITUSSIN) 100 MG/5ML syrup Take 200 mg by mouth 3 (three) times Blackburn as needed for cough.   Yes Historical Provider, MD  lovastatin (MEVACOR) 20 MG tablet Take 20 mg by mouth every evening. 03/27/12  Yes Historical Provider, MD  midodrine (PROAMATINE) 10 MG tablet Take 1 tablet by mouth every dialysis. 05/03/15  Yes Historical Provider, MD  Multiple Vitamins-Minerals (RENAPLEX-D PO) Take 1 tablet by mouth every morning.   Yes Historical Provider, MD  Nutritional Supplements (FEEDING SUPPLEMENT, NEPRO CARB STEADY,) LIQD Take 237 mLs by mouth Blackburn. 06/02/15  Yes Adrian Saran, MD  omeprazole (PRILOSEC) 40 MG capsule Take 1 capsule by mouth Blackburn. 05/30/15  Yes Historical Provider, MD  oxyCODONE (OXY IR/ROXICODONE) 5 MG immediate release tablet Take 1-2 tablets (5-10 mg total) by mouth every 4 (four) hours as needed for  moderate pain. 06/02/15  Yes Oviya Ammar, MD  SENSIPAR 30 MG tablet Take 1 tablet by mouth Blackburn. 05/17/15  Yes Historical Provider, MD  azithromycin (ZITHROMAX) 250 MG tablet Take 250 mg by mouth Blackburn. Reported on 08/14/2015    Historical Provider, MD  gemfibrozil (LOPID) 600 MG tablet Take 1 tablet by mouth 2 (two) times Blackburn. Reported on 08/14/2015 05/03/15   Historical Provider, MD      VITAL SIGNS:  Blood pressure 90/62, pulse 60, resp. rate 20, height  (1.753 m), weight 94.802 kg (209 lb), SpO2 100 %.  PHYSICAL EXAMINATION:  GENERAL:  82 y.o.-year-old patient no acute distress intubated EYES: Pupils equal, round, reactive sluggish to light and accommodation. Sclerae are anicteric ??posturing HEENT: Head atraumatic, normocephalic. Patient intubated NECK:  Supple, no jugular venous distention. No LAD LUNGS: Coarse breath sounds bilaterally with no use of accessory muscles of respiration.  CARDIOVASCULAR: S1, S2 normal. No murmurs, rubs, or gallops.  ABDOMEN: Soft, nontender, nondistended. Bowel sounds present. No organomegaly or mass.  EXTREMITIES: No pedal edema, cyanosis, or clubbing.  NEUROLOGIC: Sedated  PSYCHIATRIC: The patient is sedated  SKIN: Vascular ulcer to left medial malleolus.   LABORATORY PANEL:   CBC No results for input(s): WBC, HGB, HCT, PLT in the last 168 hours. ------------------------------------------------------------------------------------------------------------------  Chemistries  No results for input(s): NA, K, CL, CO2, GLUCOSE, BUN, CREATININE, CALCIUM, MG, AST, ALT, ALKPHOS, BILITOT in the last 168 hours.  Invalid input(s): GFRCGP ------------------------------------------------------------------------------------------------------------------  Cardiac Enzymes No results for input(s): TROPONINI in the last 168  hours. ------------------------------------------------------------------------------------------------------------------  RADIOLOGY:  Chest x-ray pending  EKG:   Pending  IMPRESSION AND PLAN:    82 year old male who presented for elective angiogram subsequently had respiratory and cardiac arrest.  1. Acute cardiac/ respiratory arrest: Patient is now intubated and had a PEA arrest. Etiology unclear at this time. Follow troponins and echocardiogram. Consults intensivists and cardiology. Continue current ventilator settings. Follow up on chest x-ray, EKG and ABG Start IV steroids and empiric antibiotics until cultures come back. Continue levo fed for blood pressure support and fentanyl for sedation. Add nebulizer treatments. Head CT  2. Type 2 diabetes: Sliding scale insulin for now.  3. History of pacemaker: Cardiology consultation. Unknown when this was last interrogated. 4.OSA on CPAP: he will need CPAP once extubated.  5. End-stage renal disease on hemodialysis: Nephrology consultation.  6. Chronic systolic heart failure: Follow-up on echocardiogram for ejection fraction.  Monitor input and output. Follow-up on Cardiology consultation.  7. Vascular ulcer to left medial malleolus: Cleanse wound to left medial malleolus with NS and pat gently dry. Apply calcium alginate to wound bed. COVer with ABD pad and kerlix/tape. Change Blackburn.   8. PAD: Management    All the records are reviewed and case discussed with nurse   CODE STATUS: FULL  CRITICAL CARE TOTAL TIME TAKING CARE OF THIS PATIENT: 60 minutes.    Celso Granja M.D on 08/14/2015 at 12:10 PM  Between 7am to 6pm - Pager - [phone] After 6pm go to  [email or web address] - password [email or web address] [facility]  Office  [phone]  CC: Primary care physician; Kirk Ruths., MD

## 2015-08-14 NOTE — Consult Note (Addendum)
WOC wound consult note Reason for Consult: Vascular ulcer to left medial malleolus.  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    Wound type:Chronic vascular Pressure Ulcer POA:N/A Measurement:2 open area proximal 3 cm x 4 cm x 0.3 cm distal:  4 cm x 6 cm x 0.3 cm both wounds are 100% fibrin slough in the wound bed.  Left plantar foot 0.5 cm x 0.5 cm x 0.1 cm with callous present circumferentially.  Wound bed:100% fibrin slough Drainage (amount, consistency, odor) Moderate strikethrough (blood) noted on dressing upon arrival to unit.  Dressing removed, pressure is applied in the one area that is noted to be bleeding.  Wound is cleansed with NS, calcium alginate to wound bed and ABD Pad/kerlix/tape applied. WIll change daily.  Periwound: Intact Dressing procedure/placement/frequency:Cleanse wound to left medial malleolus with NS and pat gently dry.  Apply calcium alginate to wound bed.  COVer with ABD pad and kerlix/tape.  Change daily.  Will not follow at this time.  Please re-consult if needed.  Maple Hudson RN BSN [facility]ON Pager [phone]

## 2015-08-14 NOTE — Progress Notes (Signed)
Patient minimally responsive in the ICU. Appears to respond to painful stimuli at this point. Is on levophed with a normal blood pressure at this point. Patient is chronically quite ill and had an acute hypoxic situation requiring intubation as well as hypotension requiring CPR for about 2 minutes today during his procedure. His overall anoxic time was reasonably low, but given his low blood pressure and hypoxia and a baseline his tolerance is poor. Patient's wife seems to understand that he is very ill and this is a serious and potentially life-threatening situation. Appreciate medical service, nephrology service, and critical care service input and care

## 2015-08-14 NOTE — H&P (Signed)
  [facility] History & Physical Update  The patient was interviewed and re-examined.  The patient's previous History and Physical has been reviewed and is unchanged.  There is no change in the plan of care. We plan to proceed with the scheduled procedure.  DEW,JASON, MD  08/14/2015, 8:01 AM

## 2015-08-14 NOTE — Consult Note (Signed)
PULMONARY / CRITICAL CARE MEDICINE   Name: [name] MRN: [id number] DOB: [birth year]    ADMISSION DATE:  08/14/2015 CONSULTATION DATE: 08/14/15  REQUESTING MD:  Wyn Quaker  PT PROFILE: 16 M with ESRD, prior CVA, prior MI, ESRD on home HD underwent complex angioplasty to LLE. During procedure had problems with hypoxemia, then suffered PEA arrest requiring intubation and 2 mins ACLS.   MAJOR EVENTS/TEST RESULTS: 02/20  Angioplasty to LLE c/b PEA arrest. Admitted to ICU after 2 mins ACLS. Intubated 02/20 LE venous US:  02/20 CT head:   INDWELLING DEVICES:: ETT 02/20 >>  R femoral CVL 02/20 >>   MICRO DATA: MRSA PCR 02/20 >> NEG  ANTIMICROBIALS:     PAST MEDICAL HISTORY :  He  has a past medical history of Renal disorder; Stroke (HCC); Coronary artery disease; Myocardial infarction (HCC); Parkinson disease (HCC); and Chronic home hemodialysis status (HCC).  PAST SURGICAL HISTORY: He  has past surgical history that includes Pacemaker insertion; ORIF ankle fracture (Left, 05/31/2015); Cardiac catheterization (N/A, 07/27/2015); and Cardiac catheterization (N/A, 07/27/2015).  Allergies  Allergen Reactions  . Penicillins Swelling    No current facility-administered medications on file prior to encounter.   Current Outpatient Prescriptions on File Prior to Encounter  Medication Sig  . aspirin EC 325 MG tablet Take 325 mg by mouth 2 (two) times daily.   . calcium acetate (PHOSLO) 667 MG capsule Take 2 capsules (1,334 mg total) by mouth 3 (three) times daily with meals.  . carbidopa-levodopa (SINEMET IR) 25-100 MG tablet Take 1 tablet by mouth 2 (two) times daily.  . carvedilol (COREG) 3.125 MG tablet Take 1 tablet (3.125 mg total) by mouth 2 (two) times daily with a meal.  . collagenase (SANTYL) ointment Apply 1 application topically daily.  . cyanocobalamin 1000 MCG tablet Take 1,000 mcg by mouth daily.  Marland Kitchen guaifenesin (ROBITUSSIN) 100 MG/5ML syrup Take 200 mg by mouth 3 (three) times  daily as needed for cough.  . lovastatin (MEVACOR) 20 MG tablet Take 20 mg by mouth every evening.  . midodrine (PROAMATINE) 10 MG tablet Take 1 tablet by mouth every dialysis.  . Multiple Vitamins-Minerals (RENAPLEX-D PO) Take 1 tablet by mouth every morning.  . Nutritional Supplements (FEEDING SUPPLEMENT, NEPRO CARB STEADY,) LIQD Take 237 mLs by mouth daily.  Marland Kitchen omeprazole (PRILOSEC) 40 MG capsule Take 1 capsule by mouth daily.  Marland Kitchen oxyCODONE (OXY IR/ROXICODONE) 5 MG immediate release tablet Take 1-2 tablets (5-10 mg total) by mouth every 4 (four) hours as needed for moderate pain.  . SENSIPAR 30 MG tablet Take 1 tablet by mouth daily.  Marland Kitchen azithromycin (ZITHROMAX) 250 MG tablet Take 250 mg by mouth daily. Reported on 08/14/2015  . gemfibrozil (LOPID) 600 MG tablet Take 1 tablet by mouth 2 (two) times daily. Reported on 08/14/2015    FAMILY HISTORY:  His has no family status information on file.   SOCIAL HISTORY: He  reports that he quit smoking about 46 years ago. He does not have any smokeless tobacco history on file. He reports that he does not drink alcohol or use illicit drugs.  REVIEW OF SYSTEMS:   Unable to obtain  SUBJECTIVE:   VITAL SIGNS: BP 121/46 mmHg  Pulse 64  Temp(Src) 97.5 F (36.4 C) (Axillary)  Resp 19  Ht  (1.753 m)  Wt 94.802 kg (209 lb)  BMI 30.85 kg/m2  SpO2 100%  HEMODYNAMICS: CVP:  [12 mmHg-29 mmHg] 26 mmHg  VENTILATOR SETTINGS: Vent Mode:  [-] PRVC  FiO2 (%):  [50 %] 50 % Set Rate:  [15 bmp] 15 bmp Vt Set:  [500 mL] 500 mL PEEP:  [5 cmH20] 5 cmH20  INTAKE / OUTPUT:    PHYSICAL EXAMINATION: General: RASS -4, chronically ill appearing Neuro: pupils unequal but react to light, corneals present, no spontaneous movement, +/- posturing with painful stimulus HEENT: NCAT Cardiovascular: regular (paced), no M Lungs: diminished BS, prolonged exp time, no wheezes noted Abdomen: soft, NT, +BS Ext: chronic stasis changes, trace to 1+ symmetric edema,  ulceration over L medial and alteral malleoli Skin: xerodermia, chronic stasis dermatitis  LABS:  BMET  Recent Labs Lab 08/14/15 1235 08/14/15 1236  NA 137  --   K 5.1  --   CL 96*  --   CO2 24  --   BUN 87*  --   CREATININE 8.94* 8.81*  GLUCOSE 142*  --     Electrolytes  Recent Labs Lab 08/14/15 1235 08/14/15 1236  CALCIUM 9.3  --   MG  --  2.2  PHOS  --  7.3*    CBC  Recent Labs Lab 08/14/15 1235  WBC 25.0*  HGB 8.7*  HCT 27.7*  PLT 346    Coag's No results for input(s): APTT, INR in the last 168 hours.  Sepsis Markers No results for input(s): LATICACIDVEN, PROCALCITON, O2SATVEN in the last 168 hours.  ABG  Recent Labs Lab 08/14/15 1144  PHART 7.30*  PCO2ART 50*  PO2ART 432*    Liver Enzymes  Recent Labs Lab 08/14/15 1236  AST 34  ALT 6*  ALKPHOS 98  BILITOT 1.2  ALBUMIN 3.0*    Cardiac Enzymes  Recent Labs Lab 08/14/15 1235  TROPONINI 0.28*    Glucose  Recent Labs Lab 08/14/15 1120  GLUCAP 162*   EKG: paced rhythm. No acute changes   CXR: cardiomegaly, vasc congestion   ASSESSMENT / PLAN:  PULMONARY A: VDRF post cardiac arrest Former smoker Mild airflow obstruction based on flow curves on vent - suspect COPD  P:   Vent settings established Vent bundle implemented Daily SBT as indicated Check ABG and adjust vent accordingly  CARDIOVASCULAR A:  PEA Cardiac arrest - etiology unclear Ischemic CM Severe PVD P:  MAP goal > 65 mmHg Cycle cardiac markers Stat echocardiogram Check LE venous US to R/O DVT  RENAL A:   ESRD on home HD P:   Monitor BMET intermittently Monitor I/Os Correct electrolytes as indicated Nephrology notified of his hospitalization  GASTROINTESTINAL A:   No acute issues P:   SUP: IV PPI Consider TFs 02/21  HEMATOLOGIC A:   Anemia of chronic disease/CKD P:  DVT px: SQ heparin Monitor CBC intermittently Transfuse per usual ICU guidelines  INFECTIOUS A:    Leukocytosis - likely stress demargination No evidence of acute bacterial infection. LLE ulcers appear clean P:   Monitor temp, WBC count Micro and abx as above  ENDOCRINE A:   Hyperglycemia, stress induced P:   SSI q 4 hrs  NEUROLOGIC A:   Post anoxic encephalopathy P:   RASS goal: 0, -1 PAD protocol with intermittent fent, midaz F/U CT head results   FAMILY  - Updates: Will attempt to speak with wife today and begin goals of care discussions  CCM time: 40 mins The above time includes time spent in consultation with patient and/or family members and reviewing care plan on multidisciplinary rounds  [name], MD PCCM service Mobile [phone] Pager [phone]   08/14/2015, 3:06 PM

## 2015-08-14 NOTE — Progress Notes (Signed)
RN spoke with Dr. Sung Amabile about placing OG tube and that patient is bleeding from lip.  MD stated to hold off on placing OG tube for now.

## 2015-08-14 NOTE — Progress Notes (Signed)
Pt CrCl <49ml/min and enoxaparin ordered. Per protocol patient will be transitioned to heparin 5000 units [place] BID.  Olene Floss, Pharm.D Clinical Pharmacist

## 2015-08-14 NOTE — Progress Notes (Signed)
RN made Dr. Sung Amabile aware of troponin of 0.28 and MD acknowledged giving no new orders.

## 2015-08-14 NOTE — Progress Notes (Signed)
RN made Dr. Sung Amabile aware that patient appears to be having decorticate posturing. MD acknowledged and gave no new orders. Patient left pupil 5mm and right 3mm and neither react to light. Patient will go for CT of head when stable.

## 2015-08-14 NOTE — Progress Notes (Signed)
°   08/14/15 1030  Clinical Encounter Type  Visited With Family  Visit Type Initial  Referral From Nurse  Consult/Referral To Chaplain  Paged to [facility] to meet patient's family, offer pastoral care and presence.  Escorted family to ICU where patient was transferred.  Introduced family to ICU chaplain, Alinda Money. Chaplain [employer] Ext [phone]

## 2015-08-15 ENCOUNTER — Encounter: Payer: Self-pay | Admitting: Vascular Surgery

## 2015-08-15 ENCOUNTER — Inpatient Hospital Stay: Payer: Medicare Other

## 2015-08-15 DIAGNOSIS — R404 Transient alteration of awareness: Secondary | ICD-10-CM

## 2015-08-15 LAB — BLOOD GAS, ARTERIAL
ALLENS TEST (PASS/FAIL): POSITIVE — AB
Acid-base deficit: 2 mmol/L (ref 0.0–2.0)
BICARBONATE: 23.7 meq/L (ref 21.0–28.0)
FIO2: 0.35
LHR: 15 {breaths}/min
MECHANICAL RATE: 15
O2 Saturation: 98.8 %
PEEP: 5 cmH2O
PO2 ART: 128 mmHg — AB (ref 83.0–108.0)
Patient temperature: 37
VT: 500 mL
pCO2 arterial: 43 mmHg (ref 32.0–48.0)
pH, Arterial: 7.35 (ref 7.350–7.450)

## 2015-08-15 LAB — BASIC METABOLIC PANEL
Anion gap: 18 — ABNORMAL HIGH (ref 5–15)
BUN: 102 mg/dL — ABNORMAL HIGH (ref 6–20)
CHLORIDE: 96 mmol/L — AB (ref 101–111)
CO2: 23 mmol/L (ref 22–32)
CREATININE: 9.98 mg/dL — AB (ref 0.61–1.24)
Calcium: 8.9 mg/dL (ref 8.9–10.3)
GFR calc non Af Amer: 4 mL/min — ABNORMAL LOW (ref >60)
GFR, EST AFRICAN AMERICAN: 5 mL/min — AB (ref >60)
Glucose, Bld: 93 mg/dL (ref 65–99)
POTASSIUM: 5.5 mmol/L — AB (ref 3.5–5.1)
SODIUM: 137 mmol/L (ref 135–145)

## 2015-08-15 LAB — TROPONIN I: Troponin I: 1.6 ng/mL — ABNORMAL HIGH (ref <0.031)

## 2015-08-15 LAB — CBC
HEMATOCRIT: 26.9 % — AB (ref 40.0–52.0)
HEMOGLOBIN: 8.6 g/dL — AB (ref 13.0–18.0)
MCH: 29.4 pg (ref 26.0–34.0)
MCHC: 31.8 g/dL — AB (ref 32.0–36.0)
MCV: 92.6 fL (ref 80.0–100.0)
Platelets: 328 10*3/uL (ref 150–440)
RBC: 2.91 MIL/uL — AB (ref 4.40–5.90)
RDW: 17 % — ABNORMAL HIGH (ref 11.5–14.5)
WBC: 15.3 10*3/uL — ABNORMAL HIGH (ref 3.8–10.6)

## 2015-08-15 LAB — GLUCOSE, CAPILLARY
GLUCOSE-CAPILLARY: 102 mg/dL — AB (ref 65–99)
Glucose-Capillary: 91 mg/dL (ref 65–99)
Glucose-Capillary: 85 mg/dL (ref 65–99)
Glucose-Capillary: 96 mg/dL (ref 65–99)
Glucose-Capillary: 105 mg/dL — ABNORMAL HIGH (ref 65–99)

## 2015-08-15 MED ORDER — PRO-STAT SUGAR FREE PO LIQD
30.0000 mL | Freq: Four times a day (QID) | ORAL | Status: DC
  Administered 2015-08-15 – 2015-08-18 (×12): 30 mL

## 2015-08-15 MED ORDER — FREE WATER
100.0000 mL | Freq: Three times a day (TID) | Status: DC
  Administered 2015-08-16 – 2015-08-19 (×9): 100 mL

## 2015-08-15 MED ORDER — EPOETIN ALFA 10000 UNIT/ML IJ SOLN
4000.0000 [IU] | Freq: Once | INTRAMUSCULAR | Status: AC
  Administered 2015-08-15: 4000 [IU] via INTRAVENOUS
  Filled 2015-08-15: qty 0.4

## 2015-08-15 MED ORDER — VITAL HIGH PROTEIN PO LIQD
1000.0000 mL | ORAL | Status: DC
  Administered 2015-08-15: 1000 mL

## 2015-08-15 NOTE — Progress Notes (Signed)
MD notified of patient's troponin Level.no new orders received.

## 2015-08-15 NOTE — Progress Notes (Signed)
HD TX Machine & Patient Checks

## 2015-08-15 NOTE — Progress Notes (Signed)
HD TX Termination 

## 2015-08-15 NOTE — Progress Notes (Signed)
Post HD Tx  

## 2015-08-15 NOTE — Care Management (Signed)
2/20 had angioplasty to LLE. During procedure had problems with hypoxemia, then suffered arrest.  He is currently intubated and on full ventilator support.  Chronic hemodialysis at [facility] in [place].  Patient is full code

## 2015-08-15 NOTE — Consult Note (Signed)
Reason for Consult:Unresponsive s/p arrest Referring Physician: Renae Gloss  CC: Unresponsive s/p arrest  HPI: [name] is an 82 y.o. male who is unable to provide any history due to mental status.  Family not at bedside.  All history obtained from the chart.  Patient underwent an aortogram and selective LLE angiogram on 2/20.  At the end of the procedure the patient became increasingly hypoxic with worsening blood pressure is well. Despite reversal with Narcan and flumazenil, the patient became unresponsive and required ventilatory support. Electrical activity remained on the monitor, but when he lost a pulse he was given approximately 2 minutes of CPR with chest compressions during bag mask ventilation. He received 1 mg of epinephrine. He was then intubated for airway support and stabilization and his oxygen levels returned to 100%. His blood pressure also improved after the milligram of epinephrine, reversal of sedation, and CPR could be stopped.  Patient has remained unresponsive.    Past Medical History  Diagnosis Date  . Renal disorder   . Stroke (HCC)   . Coronary artery disease   . Myocardial infarction (HCC)   . Parkinson disease (HCC)   . Chronic home hemodialysis status [facility])     Past Surgical History  Procedure Laterality Date  . Pacemaker insertion    . Orif ankle fracture Left 05/31/2015    Procedure: OPEN REDUCTION INTERNAL FIXATION TRIMALLEOLAR IANKLE FRACTURE DISLOCATION;  Surgeon: Christena Flake, MD;  Location: [facility] ORS;  Service: Orthopedics;  Laterality: Left;  . Peripheral vascular catheterization N/A 07/27/2015    Procedure: A/V Shuntogram/Fistulagram;  Surgeon: Annice Needy, MD;  Location: [facility] INVASIVE CV LAB;  Service: Cardiovascular;  Laterality: N/A;  . Peripheral vascular catheterization N/A 07/27/2015    Procedure: A/V Shunt Intervention;  Surgeon: Annice Needy, MD;  Location: [facility] INVASIVE CV LAB;  Service: Cardiovascular;  Laterality: N/A;  . Peripheral vascular  catheterization N/A 08/14/2015    Procedure: Abdominal Aortogram w/Lower Extremity;  Surgeon: Annice Needy, MD;  Location: [facility] INVASIVE CV LAB;  Service: Cardiovascular;  Laterality: N/A;  . Peripheral vascular catheterization  08/14/2015    Procedure: Lower Extremity Intervention;  Surgeon: Annice Needy, MD;  Location: [facility] INVASIVE CV LAB;  Service: Cardiovascular;;    Family history: Unable to obtain due to patient's mental status  Social History:  reports that he quit smoking about 46 years ago. He does not have any smokeless tobacco history on file. He reports that he does not drink alcohol or use illicit drugs.  Allergies  Allergen Reactions  . Penicillins Swelling    Medications:  I have reviewed the patient's current medications. Prior to Admission:  Prescriptions prior to admission  Medication Sig Dispense Refill Last Dose  . aspirin EC 325 MG tablet Take 325 mg by mouth 2 (two) times daily.    08/13/2015 at Unknown time  . calcium acetate (PHOSLO) 667 MG capsule Take 2 capsules (1,334 mg total) by mouth 3 (three) times daily with meals. 90 capsule 0 08/13/2015 at Unknown time  . carbidopa-levodopa (SINEMET IR) 25-100 MG tablet Take 1 tablet by mouth 2 (two) times daily.  2 08/13/2015 at Unknown time  . carvedilol (COREG) 3.125 MG tablet Take 1 tablet (3.125 mg total) by mouth 2 (two) times daily with a meal. 60 tablet 0 08/13/2015 at Unknown time  . collagenase (SANTYL) ointment Apply 1 application topically daily.   08/13/2015 at Unknown time  . cyanocobalamin 1000 MCG tablet Take 1,000 mcg by mouth daily.  08/13/2015 at Unknown time  . guaifenesin (ROBITUSSIN) 100 MG/5ML syrup Take 200 mg by mouth 3 (three) times daily as needed for cough.   08/13/2015 at Unknown time  . lovastatin (MEVACOR) 20 MG tablet Take 20 mg by mouth every evening.   08/13/2015 at Unknown time  . midodrine (PROAMATINE) 10 MG tablet Take 1 tablet by mouth every dialysis.  3 08/13/2015 at Unknown time  . Multiple  Vitamins-Minerals (RENAPLEX-D PO) Take 1 tablet by mouth every morning.   08/13/2015 at Unknown time  . Nutritional Supplements (FEEDING SUPPLEMENT, NEPRO CARB STEADY,) LIQD Take 237 mLs by mouth daily. 237 mL 0 08/13/2015 at Unknown time  . omeprazole (PRILOSEC) 40 MG capsule Take 1 capsule by mouth daily.  0 08/13/2015 at Unknown time  . oxyCODONE (OXY IR/ROXICODONE) 5 MG immediate release tablet Take 1-2 tablets (5-10 mg total) by mouth every 4 (four) hours as needed for moderate pain. 30 tablet 0 08/13/2015 at Unknown time  . SENSIPAR 30 MG tablet Take 1 tablet by mouth daily.  10 08/13/2015 at Unknown time  . azithromycin (ZITHROMAX) 250 MG tablet Take 250 mg by mouth daily. Reported on 08/14/2015   Completed Course at Unknown time  . gemfibrozil (LOPID) 600 MG tablet Take 1 tablet by mouth 2 (two) times daily. Reported on 08/14/2015  2 Not Taking at Unknown time   Scheduled: . antiseptic oral rinse  7 mL Mouth Rinse QID  . chlorhexidine gluconate  15 mL Mouth Rinse BID  . epoetin (EPOGEN/PROCRIT) injection  4,000 Units Intravenous Once  . feeding supplement (PRO-STAT SUGAR FREE 64)  30 mL Per Tube QID  . feeding supplement (VITAL HIGH PROTEIN)  1,000 mL Per Tube Q24H  . free water  100 mL Per Tube 3 times per day  . heparin subcutaneous  5,000 Units Subcutaneous Q12H  . insulin aspart  0-15 Units Subcutaneous 6 times per day  . ipratropium-albuterol  3 mL Nebulization Q6H  . pantoprazole (PROTONIX) IV  40 mg Intravenous Daily  . sodium chloride flush  10-40 mL Intracatheter Q12H    ROS: Unable to obtain due to mental status  Physical Examination: Blood pressure 100/41, pulse 60, temperature 99.2 F (37.3 C), temperature source Axillary, resp. rate 15, height  (1.753 m), weight 98.2 kg (216 lb 7.9 oz), SpO2 100 %.  HEENT-  Normocephalic, no lesions, without obvious abnormality.  Normal external eye and conjunctiva.  Normal TM's bilaterally.  Normal auditory canals and external ears.  Normal external nose, mucus membranes and septum.  Normal pharynx. Cardiovascular- S1, S2 normal, pulses palpable throughout   Lungs- decreased breath sounds Abdomen- soft, non-tender; bowel sounds normal; no masses,  no organomegaly Extremities- BLE edema Lymph-no adenopathy palpable Musculoskeletal-no joint tenderness, deformity or swelling Skin-bandaging of left foot  Neurological Examination Mental Status: Patient does not respond to verbal stimuli.  Does not respond to deep sternal rub.  Does not follow commands.  No verbalizations noted.  Cranial Nerves: II: patient does not respond confrontation bilaterally, pupils right 3 mm, left 3 mm,and reactive bilaterally III,IV,VI: Oculocephalic maneuver produces an incomplete response V,VII: corneal reflex absent bilaterally.  Frequent eye blinking noted.    VIII: patient does not respond to verbal stimuli IX,X: gag reflex reduced, XI: trapezius strength unable to test bilaterally XII: tongue strength unable to test Motor: Extremities flaccid throughout.  When upper extremities are touched there is some myoclonic-like activity noted.  No purposeful movements noted. Sensory: Does not respond to noxious stimuli in any  extremity. Deep Tendon Reflexes:  1+ in the upper extremities and absent in the lower extremities Plantars: mute bilaterally Cerebellar: Unable to perform        Laboratory Studies:   Basic Metabolic Panel:  Recent Labs Lab 08/14/15 1235 08/14/15 1236 08/15/15 0525  NA 137  --  137  K 5.1  --  5.5*  CL 96*  --  96*  CO2 24  --  23  GLUCOSE 142*  --  93  BUN 87*  --  102*  CREATININE 8.94* 8.81* 9.98*  CALCIUM 9.3  --  8.9  MG  --  2.2  --   PHOS  --  7.3*  --     Liver Function Tests:  Recent Labs Lab 08/14/15 1236  AST 34  ALT 6*  ALKPHOS 98  BILITOT 1.2  PROT 7.0  ALBUMIN 3.0*   No results for input(s): LIPASE, AMYLASE in the last 168 hours. No results for input(s): AMMONIA in the last  168 hours.  CBC:  Recent Labs Lab 08/14/15 1235 08/15/15 0525  WBC 25.0* 15.3*  NEUTROABS 23.0*  --   HGB 8.7* 8.6*  HCT 27.7* 26.9*  MCV 91.9 92.6  PLT 346 328    Cardiac Enzymes:  Recent Labs Lab 08/14/15 1235 08/14/15 2310 08/15/15 0525  TROPONINI 0.28* 1.97* 1.60*    BNP: Invalid input(s): POCBNP  CBG:  Recent Labs Lab 08/14/15 1945 08/14/15 2337 08/15/15 0342 08/15/15 0721 08/15/15 1120  GLUCAP 123* 76 91 85 96    Microbiology: Results for orders placed or performed during the hospital encounter of 08/14/15  MRSA PCR Screening     Status: None   Collection Time: 08/14/15 12:35 PM  Result Value Ref Range Status   MRSA by PCR NEGATIVE NEGATIVE Final    Comment:        The GeneXpert MRSA Assay (FDA approved for NASAL specimens only), is one component of a comprehensive MRSA colonization surveillance program. It is not intended to diagnose MRSA infection nor to guide or monitor treatment for MRSA infections.     Coagulation Studies: No results for input(s): LABPROT, INR in the last 72 hours.  Urinalysis: No results for input(s): COLORURINE, LABSPEC, PHURINE, GLUCOSEU, HGBUR, BILIRUBINUR, KETONESUR, PROTEINUR, UROBILINOGEN, NITRITE, LEUKOCYTESUR in the last 168 hours.  Invalid input(s): APPERANCEUR  Lipid Panel:     Component Value Date/Time   CHOL 218* 03/24/2012 0408   TRIG 172 03/24/2012 0408   HDL 36* 03/24/2012 0408   VLDL 34 03/24/2012 0408   LDLCALC 148* 03/24/2012 0408    HgbA1C:  Lab Results  Component Value Date   HGBA1C 4.9 03/24/2012    Urine Drug Screen:  No results found for: LABOPIA, COCAINSCRNUR, LABBENZ, AMPHETMU, THCU, LABBARB  Alcohol Level: No results for input(s): ETH in the last 168 hours.  Other results: EKG: ventricular paced.  Imaging: Ct Head Wo Contrast  08/14/2015  CLINICAL DATA:  Cough, wheezing and low oxygen saturations noted prior to angiogram. Patient became unresponsive during the procedure  and was intubated and resuscitated. EXAM: CT HEAD WITHOUT CONTRAST TECHNIQUE: Contiguous axial images were obtained from the base of the skull through the vertex without intravenous contrast. COMPARISON:  Head CT 03/25/2012 FINDINGS: Stable age related cerebral atrophy, ventriculomegaly and periventricular white matter disease. No extra-axial fluid collections are identified. No CT findings for acute hemispheric infarction or intracranial hemorrhage. No mass lesions. The brainstem and cerebellum are normal. No acute bony findings. Suspect sinonasal polyposis. Bilateral maxillary sinus mucoperiosteal thickening.  Bilateral mastoid effusions and fluid in the right middle ear cavity. The globes are intact. IMPRESSION: 1. Age related cerebral atrophy, ventriculomegaly and periventricular white matter disease. 2. No acute intracranial findings. 3. Paranasal sinus disease. 4. Bilateral mastoid effusions and right middle ear cavity fluid. Electronically Signed   By: Rudie Meyer M.D.   On: 08/14/2015 17:05   US Venous Img Lower Bilateral  08/14/2015  CLINICAL DATA:  Cardiac arrest this morning. Clinical concern for DVT. EXAM: BILATERAL LOWER EXTREMITY VENOUS DOPPLER ULTRASOUND TECHNIQUE: Gray-scale sonography with graded compression, as well as color Doppler and duplex ultrasound were performed to evaluate the lower extremity deep venous systems from the level of the common femoral vein and including the common femoral, femoral, profunda femoral, popliteal and calf veins including the posterior tibial, peroneal and gastrocnemius veins when visible. The superficial great saphenous vein was also interrogated. Spectral Doppler was utilized to evaluate flow at rest and with distal augmentation maneuvers in the common femoral, femoral and popliteal veins. COMPARISON:  None. FINDINGS: RIGHT LOWER EXTREMITY Common Femoral Vein: No evidence of thrombus. Normal compressibility, respiratory phasicity and response to augmentation.  Saphenofemoral Junction: No evidence of thrombus. Normal compressibility and flow on color Doppler imaging. Profunda Femoral Vein: No evidence of thrombus. Normal compressibility and flow on color Doppler imaging. Femoral Vein: No evidence of thrombus. Normal compressibility, respiratory phasicity and response to augmentation. Popliteal Vein: No evidence of thrombus. Normal compressibility, respiratory phasicity and response to augmentation. Calf Veins: No evidence of thrombus. Normal compressibility and flow on color Doppler imaging. Superficial Great Saphenous Vein: No evidence of thrombus. Normal compressibility and flow on color Doppler imaging. Venous Reflux:  None. Other Findings:  None. LEFT LOWER EXTREMITY Common Femoral Vein: No evidence of thrombus. Normal compressibility, respiratory phasicity and response to augmentation. Saphenofemoral Junction: No evidence of thrombus. Normal compressibility and flow on color Doppler imaging. Profunda Femoral Vein: No evidence of thrombus. Normal compressibility and flow on color Doppler imaging. Femoral Vein: No evidence of thrombus. Normal compressibility, respiratory phasicity and response to augmentation. Popliteal Vein: No evidence of thrombus. Normal compressibility, respiratory phasicity and response to augmentation. Calf Veins: No evidence of thrombus. Normal compressibility and flow on color Doppler imaging. Superficial Great Saphenous Vein: No evidence of thrombus. Normal compressibility and flow on color Doppler imaging. Venous Reflux:  None. Other Findings:  None. IMPRESSION: No evidence of deep venous thrombosis. Electronically Signed   By: Beckie Salts M.D.   On: 08/14/2015 15:00   Dg Chest Port 1 View  08/14/2015  CLINICAL DATA:  Endotracheal tube placement EXAM: PORTABLE CHEST 1 VIEW COMPARISON:  07/07/2014 and 01/11/2014 FINDINGS: Cardiomediastinal silhouette is stable. No infiltrate or pulmonary edema. There is a dual lead cardiac pacemaker with  leads in right atrium and right ventricle. Mild bilateral basilar atelectasis. Endotracheal tube in place with tip 3.6 cm above the carina. There is no pneumothorax. IMPRESSION: No infiltrate or pulmonary edema. Mild basilar atelectasis. Two leads cardiac pacemaker is unchanged in position. Endotracheal tube in place with tip 3.6 cm above the carina. No pneumothorax. Electronically Signed   By: Natasha Mead M.D.   On: 08/14/2015 13:10     Assessment/Plan: 82 year old male with multiple medical problems unresponsive s/p arrest on no sedation.  Requiring pressors.  Is now about 24 hours s/p arrest.  Patient continues to have some brain stem responses and therefore does not at this time satisfy criteria for brain death.  Initial head CT personally reviewed and shows no acute  changes.    Recommendations: 1. EEG 2. Repeat head CT on 2/22 3. Would continue off sedation 4. Will re-evaluate in AM    Thana Farr, MD Neurology [phone] 08/15/2015, 2:13 PM

## 2015-08-15 NOTE — Progress Notes (Signed)
Patient ID: [name], male   DOB: 1929-03-22, 82 y.o.   MRN: [id number] [facility] PROGRESS NOTE  [name] WUJ:[id number] DOB: 1929/01/30 DOA: 08/14/2015 PCP: Lauro Regulus., MD  HPI/Subjective: Patient intubated and no response off sedation.  Objective: Filed Vitals:   08/15/15 0700 08/15/15 0800  BP: 89/48 100/41  Pulse: 59 60  Temp:  99.2 F (37.3 C)  Resp: 15 15    Intake/Output Summary (Last 24 hours) at 08/15/15 1350 Last data filed at 08/15/15 1052  Gross per 24 hour  Intake    202 ml  Output      0 ml  Net    202 ml   Filed Weights   08/14/15 0816 08/14/15 1730  Weight: 94.802 kg (209 lb) 98.2 kg (216 lb 7.9 oz)    ROS: Review of Systems  Unable to perform ROS  patient unresponsive Exam: Physical Exam  Constitutional: He appears listless. He is intubated.  HENT:  Lips with small laceration.  Eyes:  Pupils pinpoint. Left medial conjunctival hemorrhage.  Neck: No thyroid mass present.  Cardiovascular: S1 normal and S2 normal.   Murmur heard.  Systolic murmur is present with a grade of 2/6  Pulses:      Dorsalis pedis pulses are 0 on the right side, and 0 on the left side.  Respiratory: He is intubated. He has decreased breath sounds in the right middle field, the right lower field, the left middle field and the left lower field. He has rhonchi in the right middle field, the right lower field, the left middle field and the left lower field.  GI: Soft. Bowel sounds are normal. There is no tenderness.  Musculoskeletal:       Right ankle: He exhibits swelling.       Left ankle: He exhibits swelling.  Neurological: He appears listless.  Unresponsive to painful stimuli  Skin:  Bruising seen on upper extremity. Small laceration on lip and nose. Stage III decubiti on left foot.  Psychiatric:  Patient unresponsive to painful stimuli      Data Reviewed: Basic Metabolic Panel:  Recent Labs Lab 08/14/15 1235 08/14/15 1236  08/15/15 0525  NA 137  --  137  K 5.1  --  5.5*  CL 96*  --  96*  CO2 24  --  23  GLUCOSE 142*  --  93  BUN 87*  --  102*  CREATININE 8.94* 8.81* 9.98*  CALCIUM 9.3  --  8.9  MG  --  2.2  --   PHOS  --  7.3*  --    Liver Function Tests:  Recent Labs Lab 08/14/15 1236  AST 34  ALT 6*  ALKPHOS 98  BILITOT 1.2  PROT 7.0  ALBUMIN 3.0*   CBC:  Recent Labs Lab 08/14/15 1235 08/15/15 0525  WBC 25.0* 15.3*  NEUTROABS 23.0*  --   HGB 8.7* 8.6*  HCT 27.7* 26.9*  MCV 91.9 92.6  PLT 346 328   Cardiac Enzymes:  Recent Labs Lab 08/14/15 1235 08/14/15 2310 08/15/15 0525  TROPONINI 0.28* 1.97* 1.60*    CBG:  Recent Labs Lab 08/14/15 1945 08/14/15 2337 08/15/15 0342 08/15/15 0721 08/15/15 1120  GLUCAP 123* 76 91 85 96    Recent Results (from the past 240 hour(s))  MRSA PCR Screening     Status: None   Collection Time: 08/14/15 12:35 PM  Result Value Ref Range Status   MRSA by PCR NEGATIVE NEGATIVE Final    Comment:  The GeneXpert MRSA Assay (FDA approved for NASAL specimens only), is one component of a comprehensive MRSA colonization surveillance program. It is not intended to diagnose MRSA infection nor to guide or monitor treatment for MRSA infections.      Studies: Ct Head Wo Contrast  08/14/2015  CLINICAL DATA:  Cough, wheezing and low oxygen saturations noted prior to angiogram. Patient became unresponsive during the procedure and was intubated and resuscitated. EXAM: CT HEAD WITHOUT CONTRAST TECHNIQUE: Contiguous axial images were obtained from the base of the skull through the vertex without intravenous contrast. COMPARISON:  Head CT 03/25/2012 FINDINGS: Stable age related cerebral atrophy, ventriculomegaly and periventricular white matter disease. No extra-axial fluid collections are identified. No CT findings for acute hemispheric infarction or intracranial hemorrhage. No mass lesions. The brainstem and cerebellum are normal. No acute bony  findings. Suspect sinonasal polyposis. Bilateral maxillary sinus mucoperiosteal thickening. Bilateral mastoid effusions and fluid in the right middle ear cavity. The globes are intact. IMPRESSION: 1. Age related cerebral atrophy, ventriculomegaly and periventricular white matter disease. 2. No acute intracranial findings. 3. Paranasal sinus disease. 4. Bilateral mastoid effusions and right middle ear cavity fluid. Electronically Signed   By: Rudie Meyer M.D.   On: 08/14/2015 17:05   US Venous Img Lower Bilateral  08/14/2015  CLINICAL DATA:  Cardiac arrest this morning. Clinical concern for DVT. EXAM: BILATERAL LOWER EXTREMITY VENOUS DOPPLER ULTRASOUND TECHNIQUE: Gray-scale sonography with graded compression, as well as color Doppler and duplex ultrasound were performed to evaluate the lower extremity deep venous systems from the level of the common femoral vein and including the common femoral, femoral, profunda femoral, popliteal and calf veins including the posterior tibial, peroneal and gastrocnemius veins when visible. The superficial great saphenous vein was also interrogated. Spectral Doppler was utilized to evaluate flow at rest and with distal augmentation maneuvers in the common femoral, femoral and popliteal veins. COMPARISON:  None. FINDINGS: RIGHT LOWER EXTREMITY Common Femoral Vein: No evidence of thrombus. Normal compressibility, respiratory phasicity and response to augmentation. Saphenofemoral Junction: No evidence of thrombus. Normal compressibility and flow on color Doppler imaging. Profunda Femoral Vein: No evidence of thrombus. Normal compressibility and flow on color Doppler imaging. Femoral Vein: No evidence of thrombus. Normal compressibility, respiratory phasicity and response to augmentation. Popliteal Vein: No evidence of thrombus. Normal compressibility, respiratory phasicity and response to augmentation. Calf Veins: No evidence of thrombus. Normal compressibility and flow on color  Doppler imaging. Superficial Great Saphenous Vein: No evidence of thrombus. Normal compressibility and flow on color Doppler imaging. Venous Reflux:  None. Other Findings:  None. LEFT LOWER EXTREMITY Common Femoral Vein: No evidence of thrombus. Normal compressibility, respiratory phasicity and response to augmentation. Saphenofemoral Junction: No evidence of thrombus. Normal compressibility and flow on color Doppler imaging. Profunda Femoral Vein: No evidence of thrombus. Normal compressibility and flow on color Doppler imaging. Femoral Vein: No evidence of thrombus. Normal compressibility, respiratory phasicity and response to augmentation. Popliteal Vein: No evidence of thrombus. Normal compressibility, respiratory phasicity and response to augmentation. Calf Veins: No evidence of thrombus. Normal compressibility and flow on color Doppler imaging. Superficial Great Saphenous Vein: No evidence of thrombus. Normal compressibility and flow on color Doppler imaging. Venous Reflux:  None. Other Findings:  None. IMPRESSION: No evidence of deep venous thrombosis. Electronically Signed   By: Beckie Salts M.D.   On: 08/14/2015 15:00   Dg Chest Port 1 View  08/14/2015  CLINICAL DATA:  Endotracheal tube placement EXAM: PORTABLE CHEST 1 VIEW COMPARISON:  07/07/2014  and 01/11/2014 FINDINGS: Cardiomediastinal silhouette is stable. No infiltrate or pulmonary edema. There is a dual lead cardiac pacemaker with leads in right atrium and right ventricle. Mild bilateral basilar atelectasis. Endotracheal tube in place with tip 3.6 cm above the carina. There is no pneumothorax. IMPRESSION: No infiltrate or pulmonary edema. Mild basilar atelectasis. Two leads cardiac pacemaker is unchanged in position. Endotracheal tube in place with tip 3.6 cm above the carina. No pneumothorax. Electronically Signed   By: Natasha Mead M.D.   On: 08/14/2015 13:10    Scheduled Meds: . antiseptic oral rinse  7 mL Mouth Rinse QID  . chlorhexidine  gluconate  15 mL Mouth Rinse BID  . epoetin (EPOGEN/PROCRIT) injection  4,000 Units Intravenous Once  . feeding supplement (PRO-STAT SUGAR FREE 64)  30 mL Per Tube QID  . feeding supplement (VITAL HIGH PROTEIN)  1,000 mL Per Tube Q24H  . free water  100 mL Per Tube 3 times per day  . heparin subcutaneous  5,000 Units Subcutaneous Q12H  . insulin aspart  0-15 Units Subcutaneous 6 times per day  . ipratropium-albuterol  3 mL Nebulization Q6H  . pantoprazole (PROTONIX) IV  40 mg Intravenous Daily  . sodium chloride flush  10-40 mL Intracatheter Q12H   Continuous Infusions: . norepinephrine (LEVOPHED) Adult infusion 16 mcg/min (08/15/15 0913)    Assessment/Plan:  1. Acute cardiac arrest. PEA arrest. Overall prognosis poor. Supportive care at this point. 2. Cardiogenic shock on levophed 3. Likely anoxic encephalopathy. Repeat CT scan of the head tomorrow. EEG ordered. Neurology consultation. Overall prognosis and quality of life will depend on his mental status. 4. End-stage renal disease on hemodialysis. Nephrology to proceed with dialysis 5. Type 2 diabetes- sliding-scale insulin for now 6. History of chronic systolic heart failure. 7. Peripheral vascular disease with ulcer left medial malleolus. Local wound care. 8. Anemia of chronic disease on Epogen with dialysis 9. Nutrition tube feeding to be started 10. Patient is a full code at this point in time case discussed with family in the waiting room. They will wait and see how he does over the next 24-48 hours.  Code Status:     Code Status Orders        Start     Ordered   08/14/15 1149  Full code   Continuous     08/14/15 1149    Code Status History    Date Active Date Inactive Code Status Order ID Comments User Context   05/31/2015  8:23 PM 06/02/2015  7:00 PM Full Code [id number]  Christena Flake, MD Inpatient   05/30/2015  1:08 PM 05/31/2015  8:23 PM Full Code [id number]  Adrian Saran, MD Inpatient     Family Communication: Spoke  with wife and son in the waiting room. Disposition Plan: To be determined  Consultants:  Critical care specialist  Cardiology  Nephrology  Neurology  Procedures:  Intubation  Time spent: 35 minutes critical care time  Alford Highland  [facility]

## 2015-08-15 NOTE — Progress Notes (Signed)
PULMONARY / CRITICAL CARE MEDICINE   Name: [name] MRN: [id number] DOB: January 02, 1929    ADMISSION DATE:  08/14/2015 CONSULTATION DATE: 08/14/15  REQUESTING MD:  Wyn Quaker  PT PROFILE: 33 M with ESRD, prior CVA, prior MI, ESRD on home HD underwent complex angioplasty to LLE. During procedure had problems with hypoxemia, then suffered PEA arrest requiring intubation and 2 mins ACLS.    ASSESSMENT / PLAN:  PULMONARY A: VDRF post cardiac arrest Former smoker Mild airflow obstruction based on flow curves on vent - suspect COPD  P:   Vent settings established Vent bundle implemented Patient is still too sedated while Off of sedating medications to check a weaning trial today.   CARDIOVASCULAR A:  PEA Cardiac arrest - etiology unclear Ischemic CM Severe PVD P:  MAP goal > 65 mmHg Cycle cardiac markers Stat echocardiogram Check LE venous US to R/O DVT  RENAL A:   ESRD on home HD P:   Monitor BMET intermittently Monitor I/Os Correct electrolytes as indicated Nephrology notified of his hospitalization  GASTROINTESTINAL A:   No acute issues P:   SUP: IV PPI Consider TFs 02/21  HEMATOLOGIC A:   Anemia of chronic disease/CKD P:  DVT px: SQ heparin Monitor CBC intermittently Transfuse per usual ICU guidelines  INFECTIOUS A:   Leukocytosis - likely stress demargination No evidence of acute bacterial infection. LLE ulcers appear clean P:   Monitor temp, WBC count Micro and abx as above  ENDOCRINE A:   Hyperglycemia, stress induced P:   SSI q 4 hrs  NEUROLOGIC A:   Post anoxic encephalopathy P:   RASS goal: 0, -1 PAD protocol with intermittent fent, midaz CT head reviewed. No significant change.  Discussed with the patient's son and wife at bedside. The patient is off of sedation and does not appear to arouse. We'll continue to monitor status over the next 48 hours to look for evidence of improvement.     MAJOR EVENTS/TEST RESULTS: 02/20   Angioplasty to LLE c/b PEA arrest. Admitted to ICU after 2 mins ACLS. Intubated 02/20 LE venous US:  02/20 CT head:   INDWELLING DEVICES:: ETT 02/20 >>  R femoral CVL 02/20 >>   MICRO DATA: MRSA PCR 02/20 >> NEG  ANTIMICROBIALS:     PAST MEDICAL HISTORY :  He  has a past medical history of Renal disorder; Stroke (HCC); Coronary artery disease; Myocardial infarction (HCC); Parkinson disease (HCC); and Chronic home hemodialysis status (HCC).  PAST SURGICAL HISTORY: He  has past surgical history that includes Pacemaker insertion; ORIF ankle fracture (Left, 05/31/2015); Cardiac catheterization (N/A, 07/27/2015); Cardiac catheterization (N/A, 07/27/2015); Cardiac catheterization (N/A, 08/14/2015); and Cardiac catheterization (08/14/2015).  Allergies  Allergen Reactions  . Penicillins Swelling    No current facility-administered medications on file prior to encounter.   Current Outpatient Prescriptions on File Prior to Encounter  Medication Sig  . aspirin EC 325 MG tablet Take 325 mg by mouth 2 (two) times daily.   . calcium acetate (PHOSLO) 667 MG capsule Take 2 capsules (1,334 mg total) by mouth 3 (three) times daily with meals.  . carbidopa-levodopa (SINEMET IR) 25-100 MG tablet Take 1 tablet by mouth 2 (two) times daily.  . carvedilol (COREG) 3.125 MG tablet Take 1 tablet (3.125 mg total) by mouth 2 (two) times daily with a meal.  . collagenase (SANTYL) ointment Apply 1 application topically daily.  . cyanocobalamin 1000 MCG tablet Take 1,000 mcg by mouth daily.  Marland Kitchen guaifenesin (ROBITUSSIN) 100 MG/5ML syrup Take  200 mg by mouth 3 (three) times daily as needed for cough.  . lovastatin (MEVACOR) 20 MG tablet Take 20 mg by mouth every evening.  . midodrine (PROAMATINE) 10 MG tablet Take 1 tablet by mouth every dialysis.  . Multiple Vitamins-Minerals (RENAPLEX-D PO) Take 1 tablet by mouth every morning.  . Nutritional Supplements (FEEDING SUPPLEMENT, NEPRO CARB STEADY,) LIQD Take 237 mLs by  mouth daily.  Marland Kitchen omeprazole (PRILOSEC) 40 MG capsule Take 1 capsule by mouth daily.  Marland Kitchen oxyCODONE (OXY IR/ROXICODONE) 5 MG immediate release tablet Take 1-2 tablets (5-10 mg total) by mouth every 4 (four) hours as needed for moderate pain.  . SENSIPAR 30 MG tablet Take 1 tablet by mouth daily.  Marland Kitchen azithromycin (ZITHROMAX) 250 MG tablet Take 250 mg by mouth daily. Reported on 08/14/2015  . gemfibrozil (LOPID) 600 MG tablet Take 1 tablet by mouth 2 (two) times daily. Reported on 08/14/2015    FAMILY HISTORY:  His has no family status information on file.   SOCIAL HISTORY: He  reports that he quit smoking about 46 years ago. He does not have any smokeless tobacco history on file. He reports that he does not drink alcohol or use illicit drugs.  REVIEW OF SYSTEMS:   Unable to obtain  SUBJECTIVE:   VITAL SIGNS: BP 119/44 mmHg  Pulse 68  Temp(Src) 99.6 F (37.6 C) (Axillary)  Resp 16  Ht  (1.753 m)  Wt 216 lb 7.9 oz (98.2 kg)  BMI 31.96 kg/m2  SpO2 99%  HEMODYNAMICS: CVP:  [13 mmHg-17 mmHg] 17 mmHg  VENTILATOR SETTINGS: Vent Mode:  [-] PRVC FiO2 (%):  [35 %-40 %] 35 % Set Rate:  [15 bmp] 15 bmp Vt Set:  [500 mL] 500 mL PEEP:  [5 cmH20] 5 cmH20 Plateau Pressure:  [18 cmH20] 18 cmH20  INTAKE / OUTPUT: I/O last 3 completed shifts: In: 532.2 [P.O.:20; I.V.:512.2] Out: 0   PHYSICAL EXAMINATION: General: RASS -4, chronically ill appearing Neuro: pupils unequal but react to light, corneals present, no spontaneous movement, +/- posturing with painful stimulus HEENT:  the patient appears to resist active eye opening. Does not appear to have purposeful movements at this time. Cardiovascular: regular (paced), no M Lungs: diminished BS, prolonged exp time, no wheezes noted Abdomen: soft, NT, +BS Ext: chronic stasis changes, trace to 1+ symmetric edema, ulceration over L medial and alteral malleoli Skin: xerodermia, chronic stasis dermatitis  LABS:  BMET  Recent Labs Lab  08/14/15 1235 08/14/15 1236 08/15/15 0525  NA 137  --  137  K 5.1  --  5.5*  CL 96*  --  96*  CO2 24  --  23  BUN 87*  --  102*  CREATININE 8.94* 8.81* 9.98*  GLUCOSE 142*  --  93    Electrolytes  Recent Labs Lab 08/14/15 1235 08/14/15 1236 08/15/15 0525  CALCIUM 9.3  --  8.9  MG  --  2.2  --   PHOS  --  7.3*  --     CBC  Recent Labs Lab 08/14/15 1235 08/15/15 0525  WBC 25.0* 15.3*  HGB 8.7* 8.6*  HCT 27.7* 26.9*  PLT 346 328    Coag's No results for input(s): APTT, INR in the last 168 hours.  Sepsis Markers No results for input(s): LATICACIDVEN, PROCALCITON, O2SATVEN in the last 168 hours.  ABG  Recent Labs Lab 08/14/15 1144 08/15/15 0400  PHART 7.30* 7.35  PCO2ART 50* 43  PO2ART 432* 128*    Liver Enzymes  Recent Labs Lab 08/14/15 1236  AST 34  ALT 6*  ALKPHOS 98  BILITOT 1.2  ALBUMIN 3.0*    Cardiac Enzymes  Recent Labs Lab 08/14/15 1235 08/14/15 2310 08/15/15 0525  TROPONINI 0.28* 1.97* 1.60*    Glucose  Recent Labs Lab 08/14/15 1945 08/14/15 2337 08/15/15 0342 08/15/15 0721 08/15/15 1120 08/15/15 1555  GLUCAP 123* 76 91 85 96 105*   EKG: paced rhythm. No acute changes   CXR: continued cardiomegaly, vasc congestion   Critical Care Attestation.  I have personally obtained a history, examined the patient, evaluated laboratory and imaging results, formulated the assessment and plan and placed orders. The Patient requires high complexity decision making for assessment and support, frequent evaluation and titration of therapies, application of advanced monitoring technologies and extensive interpretation of multiple databases. The patient has critical illness that could lead imminently to failure of 1 or more organ systems and requires the highest level of physician preparedness to intervene.  Critical Care Time devoted to patient care services described in this note is 35 minutes and is exclusive of time spent in  procedures.

## 2015-08-15 NOTE — Progress Notes (Signed)
[facility]  Daily Progress Note   Subjective  - 1 Day Post-Op  Remains intubated and minimally responsive On levophed with better BP this am.  May be able to wean O2 sats 100% on the vent.  ABG looked good  Objective Filed Vitals:   08/15/15 0500 08/15/15 0600 08/15/15 0700 08/15/15 0800  BP: 93/42 102/49 89/48 100/41  Pulse: 59 59 59 60  Temp: 98.7 F (37.1 C)   99.2 F (37.3 C)  TempSrc: Oral   Axillary  Resp: Height:      Weight:      SpO2: 100% 100% 98% 100%    Intake/Output Summary (Last 24 hours) at 08/15/15 0824 Last data filed at 08/14/15 2000  Gross per 24 hour  Intake  376.4 ml  Output      0 ml  Net  376.4 ml    PULM  CTAB CV  RRR VASC  Left foot warm, good capillary refill Access incision C/D/I Neuro responding only to painful stimuli.  Laboratory CBC    Component Value Date/Time   WBC 15.3* 08/15/2015 0525   WBC 14.6* 01/11/2014 1412   HGB 8.6* 08/15/2015 0525   HGB 12.2* 01/11/2014 1412   HCT 26.9* 08/15/2015 0525   HCT 38.4* 01/11/2014 1412   PLT 328 08/15/2015 0525   PLT 203 01/11/2014 1412    BMET    Component Value Date/Time   NA 137 08/15/2015 0525   NA 140 01/11/2014 1412   K 5.5* 08/15/2015 0525   K 5.4* 01/11/2014 1412   CL 96* 08/15/2015 0525   CL 101 01/11/2014 1412   CO2 23 08/15/2015 0525   CO2 28 01/11/2014 1412   GLUCOSE 93 08/15/2015 0525   GLUCOSE 131* 01/11/2014 1412   BUN 102* 08/15/2015 0525   BUN 73* 01/11/2014 1412   CREATININE 9.98* 08/15/2015 0525   CREATININE 7.96* 01/11/2014 1412   CALCIUM 8.9 08/15/2015 0525   CALCIUM 8.2* 01/11/2014 1412   GFRNONAA 4* 08/15/2015 0525   GFRNONAA 6* 01/11/2014 1412   GFRAA 5* 08/15/2015 0525   GFRAA 7* 01/11/2014 1412    Assessment/Planning: POD #1 s/p LLE angiogram with PEA arrest.     On vent with response only to pain at this point  ABG looks good on full vent support  LE warm, perfusion appears good.  Procedure was  partially completed before event  Local wound care for the ulceration with W-D dressings OK  CT of head last night showing no intracranial bleed or other acute finding, but likely too early to show anoxic injury  Family aware of situation and poor prognosis with son and wife at bedside this am  ESRD.  Will need dialysis in next day or so    DEW,JASON  08/15/2015, 8:24 AM

## 2015-08-15 NOTE — Progress Notes (Signed)
Pt grimaces and flickers feet to lower extremity pain. No other movement noted. Lungs sounds are very congested with course crackels in bilateral upper lobes. Thick, yellow-white secretions upon suctioning. Pt has been synchronous on the vent. V-paced on the cardiac monitor. Levophed is currently at 20 mcgs- up from 2 mcgs at shift change. Hemodialysis is currently in process, pt is anuric. Wife and son visited earlier and were able to be updated by Dr. Rexene Edison. Thedore Mins, Dr. Higinio Roger, and Dr. Wyn Quaker. OG has been ordered but has not been placed d/t hemodialysis. Hemodialysis nurse states she should be finished around 1930 - will defer to next shift RN.

## 2015-08-15 NOTE — Progress Notes (Signed)
Initial Nutrition Assessment    INTERVENTION:   EN: received verbal order during ICU rounds from MD Ramachandran to start TF, plan for RN to place OG/NG this AM. Recommend starting trophic feedings of Vital High Protein; in tolerating, will assess for titration. Recommended initial goal rate of 40 ml/hr with Prostat QID.  Meets 100% of estimated protein and calorie needs per ASPEN guidelines. Continue to assess   NUTRITION DIAGNOSIS:   Inadequate oral intake related to acute illness as evidenced by NPO status.  GOAL:   Provide needs based on ASPEN/SCCM guidelines  MONITOR:    (Energy Intake, Anthropometrics, Digestive System, Electrolyte/Renal Profile, Glucose Profile, Pulmonary)  REASON FOR ASSESSMENT:   Ventilator, Consult Enteral/tube feeding initiation and management  ASSESSMENT:    Pt admitted for elective angiogram, during procedure, pt went into PEA arrest, currently sedated on vent; pt with ESRD on home HD, pt to receive HD today  Past Medical History  Diagnosis Date  . Renal disorder   . Stroke (HCC)   . Coronary artery disease   . Myocardial infarction (HCC)   . Parkinson disease (HCC)   . Chronic home hemodialysis status (HCC)      Diet Order:  Diet NPO time specified   Digestive System: no OG or NG tube at present, abdomen soft, BS hypoactive   Recent Labs Lab 08/14/15 1235 08/14/15 1236 08/15/15 0525  NA 137  --  137  K 5.1  --  5.5*  CL 96*  --  96*  CO2 24  --  23  BUN 87*  --  102*  CREATININE 8.94* 8.81* 9.98*  CALCIUM 9.3  --  8.9  MG  --  2.2  --   PHOS  --  7.3*  --   GLUCOSE 142*  --  93    Glucose Profile:   Recent Labs  08/15/15 0342 08/15/15 0721 08/15/15 1120  GLUCAP 91 85 96   Nutritional Anemia Profile:  CBC Latest Ref Rng 08/15/2015 08/14/2015 07/20/2015  WBC 3.8 - 10.6 K/uL 15.3(H) 25.0(H) 6.9  Hemoglobin 13.0 - 18.0 g/dL 8.2(N) 5.6(O) 7.6(L)  Hematocrit 40.0 - 52.0 % 26.9(L) 27.7(L) 23.9(L)  Platelets 150 - 440  K/uL 328 346 241     Meds: ss novolog, levophed at 16 mcg/min  Height:   Ht Readings from Last 1 Encounters:  08/14/15  (1.753 m)    Weight:   Wt Readings from Last 1 Encounters:  08/14/15 216 lb 7.9 oz (98.2 kg)    Filed Weights   08/14/15 0816 08/14/15 1730  Weight: 209 lb (94.802 kg) 216 lb 7.9 oz (98.2 kg)   Wt Readings from Last 10 Encounters:  08/14/15 216 lb 7.9 oz (98.2 kg)  07/27/15 208 lb (94.348 kg)  06/02/15 218 lb 4.1 oz (99 kg)    BMI:  Body mass index is 31.96 kg/(m^2).  Estimated Nutritional Needs:   Kcal:  1080-1375 kcals (11-14 kcals/kg) using wt of 98.2 kg  Protein:  146 g/kg (2.0 g/kg) using IBW 72.7 kg  Fluid:  1825-2190 mL (25-30 ml/kg)     HIGH Care Level  Romelle Starcher MS, RD, LDN [phone] Pager  [phone] Weekend/On-Call Pager

## 2015-08-15 NOTE — Progress Notes (Signed)
HD TX Initiation 

## 2015-08-15 NOTE — Progress Notes (Signed)
Post HD Tx Assessment  

## 2015-08-15 NOTE — Progress Notes (Signed)
HD TX Assessment

## 2015-08-15 NOTE — Progress Notes (Signed)
Pastoral Care and prayer provided for family and friends.

## 2015-08-15 NOTE — Progress Notes (Signed)
Subjective:  Patient is critically ill Remains vent dependent He has small movements in left hand and is trying to open eyes Potassium is high   Objective:  Vital signs in last 24 hours:  Temp:  [97.5 F (36.4 C)-99.2 F (37.3 C)] 99.2 F (37.3 C) (02/21 0800) Pulse Rate:  [58-65] 60 (02/21 0800) Resp:  [8-21] 15 (02/21 0800) BP: (85-137)/(38-57) 100/41 mmHg (02/21 0800) SpO2:  [98 %-100 %] 100 % (02/21 0800) FiO2 (%):  [35 %-50 %] 35 % (02/21 0800) Weight:  [98.2 kg (216 lb 7.9 oz)] 98.2 kg (216 lb 7.9 oz) (02/20 1730)  Weight change:  Filed Weights   08/14/15 0816 08/14/15 1730  Weight: 94.802 kg (209 lb) 98.2 kg (216 lb 7.9 oz)    Intake/Output:    Intake/Output Summary (Last 24 hours) at 08/15/15 0905 Last data filed at 08/14/15 2000  Gross per 24 hour  Intake  376.4 ml  Output      0 ml  Net  376.4 ml     Physical Exam: General: Critically ill appearing  HEENT ETT, OGT, blood tinged suction tube  Neck No mass  Pulm/lungs Vent assisted  CVS/Heart Paced rhythm, irregular  Abdomen:  Soft, NT, ND  Extremities: ++ dependent  edema  Neurologic: sedated  Skin: eccymosis  Access: Left arm AVF       Basic Metabolic Panel:   Recent Labs Lab 08/14/15 1235 08/14/15 1236 08/15/15 0525  NA 137  --  137  K 5.1  --  5.5*  CL 96*  --  96*  CO2 24  --  23  GLUCOSE 142*  --  93  BUN 87*  --  102*  CREATININE 8.94* 8.81* 9.98*  CALCIUM 9.3  --  8.9  MG  --  2.2  --   PHOS  --  7.3*  --      CBC:  Recent Labs Lab 08/14/15 1235 08/15/15 0525  WBC 25.0* 15.3*  NEUTROABS 23.0*  --   HGB 8.7* 8.6*  HCT 27.7* 26.9*  MCV 91.9 92.6  PLT 346 328      Microbiology:  Recent Results (from the past 720 hour(s))  MRSA PCR Screening     Status: None   Collection Time: 08/14/15 12:35 PM  Result Value Ref Range Status   MRSA by PCR NEGATIVE NEGATIVE Final    Comment:        The GeneXpert MRSA Assay (FDA approved for NASAL specimens only), is one  component of a comprehensive MRSA colonization surveillance program. It is not intended to diagnose MRSA infection nor to guide or monitor treatment for MRSA infections.     Coagulation Studies: No results for input(s): LABPROT, INR in the last 72 hours.  Urinalysis: No results for input(s): COLORURINE, LABSPEC, PHURINE, GLUCOSEU, HGBUR, BILIRUBINUR, KETONESUR, PROTEINUR, UROBILINOGEN, NITRITE, LEUKOCYTESUR in the last 72 hours.  Invalid input(s): APPERANCEUR    Imaging: Ct Head Wo Contrast  08/14/2015  CLINICAL DATA:  Cough, wheezing and low oxygen saturations noted prior to angiogram. Patient became unresponsive during the procedure and was intubated and resuscitated. EXAM: CT HEAD WITHOUT CONTRAST TECHNIQUE: Contiguous axial images were obtained from the base of the skull through the vertex without intravenous contrast. COMPARISON:  Head CT 03/25/2012 FINDINGS: Stable age related cerebral atrophy, ventriculomegaly and periventricular white matter disease. No extra-axial fluid collections are identified. No CT findings for acute hemispheric infarction or intracranial hemorrhage. No mass lesions. The brainstem and cerebellum are normal. No acute bony findings.  Suspect sinonasal polyposis. Bilateral maxillary sinus mucoperiosteal thickening. Bilateral mastoid effusions and fluid in the right middle ear cavity. The globes are intact. IMPRESSION: 1. Age related cerebral atrophy, ventriculomegaly and periventricular white matter disease. 2. No acute intracranial findings. 3. Paranasal sinus disease. 4. Bilateral mastoid effusions and right middle ear cavity fluid. Electronically Signed   By: Rudie Meyer M.D.   On: 08/14/2015 17:05   US Venous Img Lower Bilateral  08/14/2015  CLINICAL DATA:  Cardiac arrest this morning. Clinical concern for DVT. EXAM: BILATERAL LOWER EXTREMITY VENOUS DOPPLER ULTRASOUND TECHNIQUE: Gray-scale sonography with graded compression, as well as color Doppler and duplex  ultrasound were performed to evaluate the lower extremity deep venous systems from the level of the common femoral vein and including the common femoral, femoral, profunda femoral, popliteal and calf veins including the posterior tibial, peroneal and gastrocnemius veins when visible. The superficial great saphenous vein was also interrogated. Spectral Doppler was utilized to evaluate flow at rest and with distal augmentation maneuvers in the common femoral, femoral and popliteal veins. COMPARISON:  None. FINDINGS: RIGHT LOWER EXTREMITY Common Femoral Vein: No evidence of thrombus. Normal compressibility, respiratory phasicity and response to augmentation. Saphenofemoral Junction: No evidence of thrombus. Normal compressibility and flow on color Doppler imaging. Profunda Femoral Vein: No evidence of thrombus. Normal compressibility and flow on color Doppler imaging. Femoral Vein: No evidence of thrombus. Normal compressibility, respiratory phasicity and response to augmentation. Popliteal Vein: No evidence of thrombus. Normal compressibility, respiratory phasicity and response to augmentation. Calf Veins: No evidence of thrombus. Normal compressibility and flow on color Doppler imaging. Superficial Great Saphenous Vein: No evidence of thrombus. Normal compressibility and flow on color Doppler imaging. Venous Reflux:  None. Other Findings:  None. LEFT LOWER EXTREMITY Common Femoral Vein: No evidence of thrombus. Normal compressibility, respiratory phasicity and response to augmentation. Saphenofemoral Junction: No evidence of thrombus. Normal compressibility and flow on color Doppler imaging. Profunda Femoral Vein: No evidence of thrombus. Normal compressibility and flow on color Doppler imaging. Femoral Vein: No evidence of thrombus. Normal compressibility, respiratory phasicity and response to augmentation. Popliteal Vein: No evidence of thrombus. Normal compressibility, respiratory phasicity and response to  augmentation. Calf Veins: No evidence of thrombus. Normal compressibility and flow on color Doppler imaging. Superficial Great Saphenous Vein: No evidence of thrombus. Normal compressibility and flow on color Doppler imaging. Venous Reflux:  None. Other Findings:  None. IMPRESSION: No evidence of deep venous thrombosis. Electronically Signed   By: Beckie Salts M.D.   On: 08/14/2015 15:00   Dg Chest Port 1 View  08/14/2015  CLINICAL DATA:  Endotracheal tube placement EXAM: PORTABLE CHEST 1 VIEW COMPARISON:  07/07/2014 and 01/11/2014 FINDINGS: Cardiomediastinal silhouette is stable. No infiltrate or pulmonary edema. There is a dual lead cardiac pacemaker with leads in right atrium and right ventricle. Mild bilateral basilar atelectasis. Endotracheal tube in place with tip 3.6 cm above the carina. There is no pneumothorax. IMPRESSION: No infiltrate or pulmonary edema. Mild basilar atelectasis. Two leads cardiac pacemaker is unchanged in position. Endotracheal tube in place with tip 3.6 cm above the carina. No pneumothorax. Electronically Signed   By: Natasha Mead M.D.   On: 08/14/2015 13:10     Medications:   . norepinephrine (LEVOPHED) Adult infusion 14 mcg/min (08/15/15 0812)   . antiseptic oral rinse  7 mL Mouth Rinse QID  . chlorhexidine gluconate  15 mL Mouth Rinse BID  . heparin subcutaneous  5,000 Units Subcutaneous Q12H  . insulin aspart  0-15  Units Subcutaneous 6 times per day  . ipratropium-albuterol  3 mL Nebulization Q6H  . pantoprazole (PROTONIX) IV  40 mg Intravenous Daily  . sodium chloride flush  10-40 mL Intracatheter Q12H   albuterol, fentaNYL (SUBLIMAZE) injection, fentaNYL (SUBLIMAZE) injection, midazolam, midazolam, sodium chloride flush  Assessment/ Plan:  82 y.o. male [name] is a 82 y.o. white male with Parkinson's, coronary artery disease, hyperlipidemia, hypertension, GERD,. 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 80% stenosis of the tibioperoneal trunk with peroneal runoff distally which was diffusely diseased. Occlusion of the anterior tibial artery with reconstitution of the anterior tibial artery in the lower leg, hypoxic PEA arrest 08/14/15, Left ankle fracture: ORIF for left ankle. 12/7 Dr. Joice Lofts,   [facility] Serve Cheree Ditto  1. End Stage Renal Disease with hyperkalemia today - HD today  2.  Acute resp failure S/P hypoxic PEA arrest - vent assisted  3. Secondary Hyperparathyroidism  - monitor ca/phos this admission  4. Anemia of chronic kidney disease: hemoglobin 8.6 - epo with hemodialysis treatment   5. dependen t edema - fluid to be removed with HD as tolerated. Today's goal 1-1.5 L    LOS: 1 Rosbel Buckner 2/21/20179:05 AM

## 2015-08-16 ENCOUNTER — Inpatient Hospital Stay: Payer: Medicare Other

## 2015-08-16 DIAGNOSIS — R092 Respiratory arrest: Secondary | ICD-10-CM | POA: Insufficient documentation

## 2015-08-16 DIAGNOSIS — R0600 Dyspnea, unspecified: Secondary | ICD-10-CM

## 2015-08-16 LAB — BASIC METABOLIC PANEL
Anion gap: 13 (ref 5–15)
BUN: 60 mg/dL — AB (ref 6–20)
CO2: 28 mmol/L (ref 22–32)
Calcium: 8.8 mg/dL — ABNORMAL LOW (ref 8.9–10.3)
Chloride: 97 mmol/L — ABNORMAL LOW (ref 101–111)
Creatinine, Ser: 6.78 mg/dL — ABNORMAL HIGH (ref 0.61–1.24)
GFR calc Af Amer: 8 mL/min — ABNORMAL LOW (ref >60)
GFR, EST NON AFRICAN AMERICAN: 7 mL/min — AB (ref >60)
GLUCOSE: 140 mg/dL — AB (ref 65–99)
POTASSIUM: 4.2 mmol/L (ref 3.5–5.1)
Sodium: 138 mmol/L (ref 135–145)

## 2015-08-16 LAB — EXPECTORATED SPUTUM ASSESSMENT W GRAM STAIN, RFLX TO RESP C

## 2015-08-16 LAB — BLOOD GAS, ARTERIAL
ACID-BASE EXCESS: 3.7 mmol/L — AB (ref 0.0–3.0)
ALLENS TEST (PASS/FAIL): POSITIVE — AB
Bicarbonate: 28.5 mEq/L — ABNORMAL HIGH (ref 21.0–28.0)
FIO2: 0.35
O2 SAT: 99.2 %
PATIENT TEMPERATURE: 37
PCO2 ART: 43 mmHg (ref 32.0–48.0)
PEEP: 5 cmH2O
PH ART: 7.43 (ref 7.350–7.450)
PO2 ART: 141 mmHg — AB (ref 83.0–108.0)
RATE: 15 resp/min
VT: 500 mL

## 2015-08-16 LAB — EXPECTORATED SPUTUM ASSESSMENT W REFEX TO RESP CULTURE: SPECIAL REQUESTS: NORMAL

## 2015-08-16 LAB — GLUCOSE, CAPILLARY
GLUCOSE-CAPILLARY: 119 mg/dL — AB (ref 65–99)
GLUCOSE-CAPILLARY: 131 mg/dL — AB (ref 65–99)
GLUCOSE-CAPILLARY: 132 mg/dL — AB (ref 65–99)
GLUCOSE-CAPILLARY: 139 mg/dL — AB (ref 65–99)
Glucose-Capillary: 109 mg/dL — ABNORMAL HIGH (ref 65–99)
Glucose-Capillary: 120 mg/dL — ABNORMAL HIGH (ref 65–99)
Glucose-Capillary: 136 mg/dL — ABNORMAL HIGH (ref 65–99)

## 2015-08-16 MED ORDER — PANTOPRAZOLE SODIUM 40 MG PO PACK
40.0000 mg | PACK | Freq: Every day | ORAL | Status: DC
  Administered 2015-08-16 – 2015-08-18 (×3): 40 mg
  Filled 2015-08-16 (×3): qty 20

## 2015-08-16 MED ORDER — DEXTROSE 5 % IV SOLN
1.0000 g | INTRAVENOUS | Status: DC
  Administered 2015-08-16 – 2015-08-18 (×3): 1 g via INTRAVENOUS
  Filled 2015-08-16 (×4): qty 1

## 2015-08-16 MED ORDER — SENNOSIDES-DOCUSATE SODIUM 8.6-50 MG PO TABS
1.0000 | ORAL_TABLET | Freq: Two times a day (BID) | ORAL | Status: DC
  Administered 2015-08-16 – 2015-08-18 (×6): 1 via ORAL
  Filled 2015-08-16 (×7): qty 1

## 2015-08-16 MED ORDER — VITAL HIGH PROTEIN PO LIQD
1000.0000 mL | ORAL | Status: DC
  Administered 2015-08-16 – 2015-08-18 (×2): 1000 mL

## 2015-08-16 MED ORDER — ACETAMINOPHEN 325 MG PO TABS
650.0000 mg | ORAL_TABLET | Freq: Four times a day (QID) | ORAL | Status: DC | PRN
  Administered 2015-08-16 – 2015-08-18 (×3): 650 mg via ORAL
  Filled 2015-08-16 (×5): qty 2

## 2015-08-16 NOTE — Progress Notes (Addendum)
Subjective: Patient unchanged.  Remains unresponsive.    Objective: Current vital signs: BP 117/45 mmHg  Pulse 70  Temp(Src) 100.6 F (38.1 C) (Axillary)  Resp 17  Ht 5\' 9"  (1.753 m)  Wt 96.5 kg (212 lb 11.9 oz)  BMI 31.40 kg/m2  SpO2 100% Vital signs in last 24 hours: Temp:  [99.2 F (37.3 C)-100.6 F (38.1 C)] 100.6 F (38.1 C) (02/22 0800) Pulse Rate:  [65-78] 70 (02/22 0900) Resp:  [15-19] 17 (02/22 0900) BP: (100-133)/(40-58) 117/45 mmHg (02/22 0900) SpO2:  [99 %-100 %] 100 % (02/22 0900) FiO2 (%):  [30 %-35 %] 30 % (02/22 1200) Weight:  [96.5 kg (212 lb 11.9 oz)-98.2 kg (216 lb 7.9 oz)] 96.5 kg (212 lb 11.9 oz) (02/21 1945)  Intake/Output from previous day: 02/21 0701 - 02/22 0700 In: 413.3 [I.V.:393.3; NG/GT:20] Out: 1500  Intake/Output this shift: Total I/O In: 30 [I.V.:30] Out: -  Nutritional status: Diet NPO time specified  Neurologic Exam: Mental Status: Patient does not respond to verbal stimuli. Does not respond to deep sternal rub. Does not follow commands. No verbalizations noted.  Cranial Nerves: II: patient does not respond confrontation bilaterally, pupils right 3 mm, left 3 mm,and reactive bilaterally III,IV,VI: Oculocephalic maneuver produces an incomplete response V,VII: corneal reflex absent on the left but present on the right. Frequent eye blinking noted.  VIII: patient does not respond to verbal stimuli IX,X: gag reflex reduced, XI: trapezius strength unable to test bilaterally XII: tongue strength unable to test Motor: Extremities flaccid throughout. When upper extremities are touched there is some myoclonic-like activity noted. No purposeful movements noted. Sensory: Does not respond to noxious stimuli in any extremity. Deep Tendon Reflexes:  1+ in the upper extremities and absent in the lower extremities Plantars: mute bilaterally Cerebellar: Unable to perform   Lab Results: Basic Metabolic Panel:  Recent Labs Lab  08/14/15 1235 08/14/15 1236 08/15/15 0525 08/16/15 0508  NA 137  --  137 138  K 5.1  --  5.5* 4.2  CL 96*  --  96* 97*  CO2 24  --  23 28  GLUCOSE 142*  --  93 140*  BUN 87*  --  102* 60*  CREATININE 8.94* 8.81* 9.98* 6.78*  CALCIUM 9.3  --  8.9 8.8*  MG  --  2.2  --   --   PHOS  --  7.3*  --   --     Liver Function Tests:  Recent Labs Lab 08/14/15 1236  AST 34  ALT 6*  ALKPHOS 98  BILITOT 1.2  PROT 7.0  ALBUMIN 3.0*   No results for input(s): LIPASE, AMYLASE in the last 168 hours. No results for input(s): AMMONIA in the last 168 hours.  CBC:  Recent Labs Lab 08/14/15 1235 08/15/15 0525  WBC 25.0* 15.3*  NEUTROABS 23.0*  --   HGB 8.7* 8.6*  HCT 27.7* 26.9*  MCV 91.9 92.6  PLT 346 328    Cardiac Enzymes:  Recent Labs Lab 08/14/15 1235 08/14/15 2310 08/15/15 0525  TROPONINI 0.28* 1.97* 1.60*    Lipid Panel: No results for input(s): CHOL, TRIG, HDL, CHOLHDL, VLDL, LDLCALC in the last 168 hours.  CBG:  Recent Labs Lab 08/15/15 1555 08/15/15 1942 08/16/15 0015 08/16/15 0420 08/16/15 0724  GLUCAP 105* 102* 109* 136* 120*    Microbiology: Results for orders placed or performed during the hospital encounter of 08/14/15  MRSA PCR Screening     Status: None   Collection Time: 08/14/15 12:35 PM  Result Value Ref Range Status   MRSA by PCR NEGATIVE NEGATIVE Final    Comment:        The GeneXpert MRSA Assay (FDA approved for NASAL specimens only), is one component of a comprehensive MRSA colonization surveillance program. It is not intended to diagnose MRSA infection nor to guide or monitor treatment for MRSA infections.   Culture, expectorated sputum-assessment     Status: None   Collection Time: 08/16/15 12:25 AM  Result Value Ref Range Status   Specimen Description SPUTUM  Final   Special Requests Normal  Final   Sputum evaluation THIS SPECIMEN IS ACCEPTABLE FOR SPUTUM CULTURE  Final   Report Status 08/16/2015 FINAL  Final  Culture,  expectorated sputum-assessment     Status: None   Collection Time: 08/16/15 12:15 PM  Result Value Ref Range Status   Specimen Description EXPECTORATED SPUTUM  Final   Special Requests NONE  Final   Sputum evaluation THIS SPECIMEN IS ACCEPTABLE FOR SPUTUM CULTURE  Final   Report Status 08/16/2015 FINAL  Final    Coagulation Studies: No results for input(s): LABPROT, INR in the last 72 hours.  Imaging: Dg Chest 1 View  08/16/2015  CLINICAL DATA:  Acute onset of dyspnea.  Initial encounter. EXAM: CHEST 1 VIEW COMPARISON:  Chest radiograph performed 08/14/2015 FINDINGS: The patient's endotracheal tube is seen ending 6 cm above the carina. An enteric tube is noted extending below the diaphragm. The lungs are mildly hypoexpanded. Vascular crowding and vascular congestion are seen. Mildly increased interstitial markings could reflect minimal interstitial edema. No pleural effusion or pneumothorax is seen. The cardiomediastinal silhouette is mildly enlarged. A pacemaker is noted overlying the left chest wall, with leads ending overlying the right atrium and right ventricle. No acute osseous abnormalities are identified. IMPRESSION: 1. Endotracheal tube seen ending 6 cm above the carina. 2. Lungs mildly hypoexpanded. Vascular congestion and mild cardiomegaly. Mildly increased interstitial markings could reflect minimal interstitial edema. Electronically Signed   By: Roanna Raider M.D.   On: 08/16/2015 03:02   Dg Abd 1 View  08/15/2015  CLINICAL DATA:  Orogastric tube placement. EXAM: ABDOMEN - 1 VIEW COMPARISON:  None. FINDINGS: Tip and side port of the enteric tube are below the diaphragm in the stomach. There is a normal bowel gas pattern in the included upper abdomen. The heart appears enlarged. IMPRESSION: Tip and side port of the enteric tube below the diaphragm in the stomach. Electronically Signed   By: Rubye Oaks M.D.   On: 08/15/2015 22:02   Ct Head Wo Contrast  08/16/2015  CLINICAL DATA:   Responsible only to painful stimuli with no other movements noted, suffered respiratory arrest during left lower extremity angioplasty on 08/14/2015 EXAM: CT HEAD WITHOUT CONTRAST TECHNIQUE: Contiguous axial images were obtained from the base of the skull through the vertex without intravenous contrast. COMPARISON:  08/14/2015 FINDINGS: Fluid in the bilateral mastoid air cells. Inflammatory change left maxillary sinus and to a lesser degree in the ethmoid air cells. These findings are stable. There is no evidence of intracranial hemorrhage or extra-axial fluid. There is diffuse atrophy and white matter disease with no evidence of vascular territory infarct. Stable prominence of the ventricles related to atrophy. IMPRESSION: Involutional changes and mastoid air cell opacification stable. No acute intracranial abnormalities. Electronically Signed   By: Esperanza Heir M.D.   On: 08/16/2015 10:05   Ct Head Wo Contrast  08/14/2015  CLINICAL DATA:  Cough, wheezing and low oxygen saturations noted prior to  angiogram. Patient became unresponsive during the procedure and was intubated and resuscitated. EXAM: CT HEAD WITHOUT CONTRAST TECHNIQUE: Contiguous axial images were obtained from the base of the skull through the vertex without intravenous contrast. COMPARISON:  Head CT 03/25/2012 FINDINGS: Stable age related cerebral atrophy, ventriculomegaly and periventricular white matter disease. No extra-axial fluid collections are identified. No CT findings for acute hemispheric infarction or intracranial hemorrhage. No mass lesions. The brainstem and cerebellum are normal. No acute bony findings. Suspect sinonasal polyposis. Bilateral maxillary sinus mucoperiosteal thickening. Bilateral mastoid effusions and fluid in the right middle ear cavity. The globes are intact. IMPRESSION: 1. Age related cerebral atrophy, ventriculomegaly and periventricular white matter disease. 2. No acute intracranial findings. 3. Paranasal sinus  disease. 4. Bilateral mastoid effusions and right middle ear cavity fluid. Electronically Signed   By: Rudie Meyer M.D.   On: 08/14/2015 17:05   US Venous Img Lower Bilateral  08/14/2015  CLINICAL DATA:  Cardiac arrest this morning. Clinical concern for DVT. EXAM: BILATERAL LOWER EXTREMITY VENOUS DOPPLER ULTRASOUND TECHNIQUE: Gray-scale sonography with graded compression, as well as color Doppler and duplex ultrasound were performed to evaluate the lower extremity deep venous systems from the level of the common femoral vein and including the common femoral, femoral, profunda femoral, popliteal and calf veins including the posterior tibial, peroneal and gastrocnemius veins when visible. The superficial great saphenous vein was also interrogated. Spectral Doppler was utilized to evaluate flow at rest and with distal augmentation maneuvers in the common femoral, femoral and popliteal veins. COMPARISON:  None. FINDINGS: RIGHT LOWER EXTREMITY Common Femoral Vein: No evidence of thrombus. Normal compressibility, respiratory phasicity and response to augmentation. Saphenofemoral Junction: No evidence of thrombus. Normal compressibility and flow on color Doppler imaging. Profunda Femoral Vein: No evidence of thrombus. Normal compressibility and flow on color Doppler imaging. Femoral Vein: No evidence of thrombus. Normal compressibility, respiratory phasicity and response to augmentation. Popliteal Vein: No evidence of thrombus. Normal compressibility, respiratory phasicity and response to augmentation. Calf Veins: No evidence of thrombus. Normal compressibility and flow on color Doppler imaging. Superficial Great Saphenous Vein: No evidence of thrombus. Normal compressibility and flow on color Doppler imaging. Venous Reflux:  None. Other Findings:  None. LEFT LOWER EXTREMITY Common Femoral Vein: No evidence of thrombus. Normal compressibility, respiratory phasicity and response to augmentation. Saphenofemoral  Junction: No evidence of thrombus. Normal compressibility and flow on color Doppler imaging. Profunda Femoral Vein: No evidence of thrombus. Normal compressibility and flow on color Doppler imaging. Femoral Vein: No evidence of thrombus. Normal compressibility, respiratory phasicity and response to augmentation. Popliteal Vein: No evidence of thrombus. Normal compressibility, respiratory phasicity and response to augmentation. Calf Veins: No evidence of thrombus. Normal compressibility and flow on color Doppler imaging. Superficial Great Saphenous Vein: No evidence of thrombus. Normal compressibility and flow on color Doppler imaging. Venous Reflux:  None. Other Findings:  None. IMPRESSION: No evidence of deep venous thrombosis. Electronically Signed   By: Beckie Salts M.D.   On: 08/14/2015 15:00    Medications:  I have reviewed the patient's current medications. Scheduled: . antiseptic oral rinse  7 mL Mouth Rinse QID  . ceFEPime (MAXIPIME) IV  1 g Intravenous Q24H  . chlorhexidine gluconate  15 mL Mouth Rinse BID  . feeding supplement (PRO-STAT SUGAR FREE 64)  30 mL Per Tube QID  . free water  100 mL Per Tube 3 times per day  . heparin subcutaneous  5,000 Units Subcutaneous Q12H  . insulin aspart  0-15 Units  Subcutaneous 6 times per day  . ipratropium-albuterol  3 mL Nebulization Q6H  . pantoprazole sodium  40 mg Per Tube Daily  . senna-docusate  1 tablet Oral BID  . sodium chloride flush  10-40 mL Intracatheter Q12H    Assessment/Plan: Patient remains unresponsive.  Off sedation.  Renal function improving.  Head CT repeated today and personally reviewed.  It shows no evidence of diffuse edema.  No evidence of hemorrhage.  EEG pending.    Recommendations: 1.  Will follow up EEG    LOS: 2 days   Thana Farr, MD Neurology [phone] 08/16/2015  1:15 PM

## 2015-08-16 NOTE — Progress Notes (Addendum)
Patient ID: [name], male   DOB: [birth year], 82 y.o.   MRN: [id number] [facility] PROGRESS NOTE  ARTAVIUS STEARNS WUJ:[id number] DOB: 10-Jan-1929 DOA: 08/14/2015 PCP: Lauro Regulus., MD  HPI/Subjective: Patient intubated and tried to open eyes when I sternal rub him  Objective: Filed Vitals:   08/16/15 1200 08/16/15 1300  BP: 118/48 111/48  Pulse: 75 73  Temp:    Resp: 18 16    Filed Weights   08/14/15 1730 08/15/15 1615 08/15/15 1945  Weight: 98.2 kg (216 lb 7.9 oz) 98.2 kg (216 lb 7.9 oz) 96.5 kg (212 lb 11.9 oz)    ROS: Review of Systems  Unable to perform ROS  patient unresponsive Exam: Physical Exam  Constitutional: He appears listless. He is intubated.  HENT:  Lips with small laceration.  Eyes:  Pupils pinpoint. Left medial conjunctival hemorrhage.  Neck: No thyroid mass present.  Cardiovascular: S1 normal and S2 normal.   Murmur heard.  Systolic murmur is present with a grade of 2/6  Pulses:      Dorsalis pedis pulses are 0 on the right side, and 0 on the left side.  Respiratory: He is intubated. He has decreased breath sounds in the right middle field, the right lower field, the left middle field and the left lower field. He has rhonchi in the right middle field, the right lower field, the left middle field and the left lower field.  GI: Soft. Bowel sounds are normal. There is no tenderness.  Musculoskeletal:       Right ankle: He exhibits swelling.       Left ankle: He exhibits swelling.  Neurological: He appears listless.  Tried to open eyes to sternal rub  Skin:  Bruising seen on upper extremity. Small laceration on lip and nose. Stage III decubiti on left foot.  Psychiatric:  Patient tried to open eyes to sternal rub      Data Reviewed: Basic Metabolic Panel:  Recent Labs Lab 08/14/15 1235 08/14/15 1236 08/15/15 0525 08/16/15 0508  NA 137  --  137 138  K 5.1  --  5.5* 4.2  CL 96*  --  96* 97*  CO2 24  --  23 28   GLUCOSE 142*  --  93 140*  BUN 87*  --  102* 60*  CREATININE 8.94* 8.81* 9.98* 6.78*  CALCIUM 9.3  --  8.9 8.8*  MG  --  2.2  --   --   PHOS  --  7.3*  --   --    Liver Function Tests:  Recent Labs Lab 08/14/15 1236  AST 34  ALT 6*  ALKPHOS 98  BILITOT 1.2  PROT 7.0  ALBUMIN 3.0*   CBC:  Recent Labs Lab 08/14/15 1235 08/15/15 0525  WBC 25.0* 15.3*  NEUTROABS 23.0*  --   HGB 8.7* 8.6*  HCT 27.7* 26.9*  MCV 91.9 92.6  PLT 346 328   Cardiac Enzymes:  Recent Labs Lab 08/14/15 1235 08/14/15 2310 08/15/15 0525  TROPONINI 0.28* 1.97* 1.60*    CBG:  Recent Labs Lab 08/15/15 1555 08/15/15 1942 08/16/15 0015 08/16/15 0420 08/16/15 0724  GLUCAP 105* 102* 109* 136* 120*    Recent Results (from the past 240 hour(s))  MRSA PCR Screening     Status: None   Collection Time: 08/14/15 12:35 PM  Result Value Ref Range Status   MRSA by PCR NEGATIVE NEGATIVE Final    Comment:        The GeneXpert MRSA  Assay (FDA approved for NASAL specimens only), is one component of a comprehensive MRSA colonization surveillance program. It is not intended to diagnose MRSA infection nor to guide or monitor treatment for MRSA infections.   Culture, expectorated sputum-assessment     Status: None   Collection Time: 08/16/15 12:25 AM  Result Value Ref Range Status   Specimen Description SPUTUM  Final   Special Requests Normal  Final   Sputum evaluation THIS SPECIMEN IS ACCEPTABLE FOR SPUTUM CULTURE  Final   Report Status 08/16/2015 FINAL  Final  Culture, expectorated sputum-assessment     Status: None   Collection Time: 08/16/15 12:15 PM  Result Value Ref Range Status   Specimen Description EXPECTORATED SPUTUM  Final   Special Requests NONE  Final   Sputum evaluation THIS SPECIMEN IS ACCEPTABLE FOR SPUTUM CULTURE  Final   Report Status 08/16/2015 FINAL  Final     Studies: Dg Chest 1 View  08/16/2015  CLINICAL DATA:  Acute onset of dyspnea.  Initial encounter. EXAM:  CHEST 1 VIEW COMPARISON:  Chest radiograph performed 08/14/2015 FINDINGS: The patient's endotracheal tube is seen ending 6 cm above the carina. An enteric tube is noted extending below the diaphragm. The lungs are mildly hypoexpanded. Vascular crowding and vascular congestion are seen. Mildly increased interstitial markings could reflect minimal interstitial edema. No pleural effusion or pneumothorax is seen. The cardiomediastinal silhouette is mildly enlarged. A pacemaker is noted overlying the left chest wall, with leads ending overlying the right atrium and right ventricle. No acute osseous abnormalities are identified. IMPRESSION: 1. Endotracheal tube seen ending 6 cm above the carina. 2. Lungs mildly hypoexpanded. Vascular congestion and mild cardiomegaly. Mildly increased interstitial markings could reflect minimal interstitial edema. Electronically Signed   By: Roanna Raider M.D.   On: 08/16/2015 03:02   Dg Abd 1 View  08/15/2015  CLINICAL DATA:  Orogastric tube placement. EXAM: ABDOMEN - 1 VIEW COMPARISON:  None. FINDINGS: Tip and side port of the enteric tube are below the diaphragm in the stomach. There is a normal bowel gas pattern in the included upper abdomen. The heart appears enlarged. IMPRESSION: Tip and side port of the enteric tube below the diaphragm in the stomach. Electronically Signed   By: Rubye Oaks M.D.   On: 08/15/2015 22:02   Ct Head Wo Contrast  08/16/2015  CLINICAL DATA:  Responsible only to painful stimuli with no other movements noted, suffered respiratory arrest during left lower extremity angioplasty on 08/14/2015 EXAM: CT HEAD WITHOUT CONTRAST TECHNIQUE: Contiguous axial images were obtained from the base of the skull through the vertex without intravenous contrast. COMPARISON:  08/14/2015 FINDINGS: Fluid in the bilateral mastoid air cells. Inflammatory change left maxillary sinus and to a lesser degree in the ethmoid air cells. These findings are stable. There is no  evidence of intracranial hemorrhage or extra-axial fluid. There is diffuse atrophy and white matter disease with no evidence of vascular territory infarct. Stable prominence of the ventricles related to atrophy. IMPRESSION: Involutional changes and mastoid air cell opacification stable. No acute intracranial abnormalities. Electronically Signed   By: Esperanza Heir M.D.   On: 08/16/2015 10:05   Ct Head Wo Contrast  08/14/2015  CLINICAL DATA:  Cough, wheezing and low oxygen saturations noted prior to angiogram. Patient became unresponsive during the procedure and was intubated and resuscitated. EXAM: CT HEAD WITHOUT CONTRAST TECHNIQUE: Contiguous axial images were obtained from the base of the skull through the vertex without intravenous contrast. COMPARISON:  Head CT 03/25/2012  FINDINGS: Stable age related cerebral atrophy, ventriculomegaly and periventricular white matter disease. No extra-axial fluid collections are identified. No CT findings for acute hemispheric infarction or intracranial hemorrhage. No mass lesions. The brainstem and cerebellum are normal. No acute bony findings. Suspect sinonasal polyposis. Bilateral maxillary sinus mucoperiosteal thickening. Bilateral mastoid effusions and fluid in the right middle ear cavity. The globes are intact. IMPRESSION: 1. Age related cerebral atrophy, ventriculomegaly and periventricular white matter disease. 2. No acute intracranial findings. 3. Paranasal sinus disease. 4. Bilateral mastoid effusions and right middle ear cavity fluid. Electronically Signed   By: Rudie Meyer M.D.   On: 08/14/2015 17:05   US Venous Img Lower Bilateral  08/14/2015  CLINICAL DATA:  Cardiac arrest this morning. Clinical concern for DVT. EXAM: BILATERAL LOWER EXTREMITY VENOUS DOPPLER ULTRASOUND TECHNIQUE: Gray-scale sonography with graded compression, as well as color Doppler and duplex ultrasound were performed to evaluate the lower extremity deep venous systems from the level  of the common femoral vein and including the common femoral, femoral, profunda femoral, popliteal and calf veins including the posterior tibial, peroneal and gastrocnemius veins when visible. The superficial great saphenous vein was also interrogated. Spectral Doppler was utilized to evaluate flow at rest and with distal augmentation maneuvers in the common femoral, femoral and popliteal veins. COMPARISON:  None. FINDINGS: RIGHT LOWER EXTREMITY Common Femoral Vein: No evidence of thrombus. Normal compressibility, respiratory phasicity and response to augmentation. Saphenofemoral Junction: No evidence of thrombus. Normal compressibility and flow on color Doppler imaging. Profunda Femoral Vein: No evidence of thrombus. Normal compressibility and flow on color Doppler imaging. Femoral Vein: No evidence of thrombus. Normal compressibility, respiratory phasicity and response to augmentation. Popliteal Vein: No evidence of thrombus. Normal compressibility, respiratory phasicity and response to augmentation. Calf Veins: No evidence of thrombus. Normal compressibility and flow on color Doppler imaging. Superficial Great Saphenous Vein: No evidence of thrombus. Normal compressibility and flow on color Doppler imaging. Venous Reflux:  None. Other Findings:  None. LEFT LOWER EXTREMITY Common Femoral Vein: No evidence of thrombus. Normal compressibility, respiratory phasicity and response to augmentation. Saphenofemoral Junction: No evidence of thrombus. Normal compressibility and flow on color Doppler imaging. Profunda Femoral Vein: No evidence of thrombus. Normal compressibility and flow on color Doppler imaging. Femoral Vein: No evidence of thrombus. Normal compressibility, respiratory phasicity and response to augmentation. Popliteal Vein: No evidence of thrombus. Normal compressibility, respiratory phasicity and response to augmentation. Calf Veins: No evidence of thrombus. Normal compressibility and flow on color Doppler  imaging. Superficial Great Saphenous Vein: No evidence of thrombus. Normal compressibility and flow on color Doppler imaging. Venous Reflux:  None. Other Findings:  None. IMPRESSION: No evidence of deep venous thrombosis. Electronically Signed   By: Beckie Salts M.D.   On: 08/14/2015 15:00    Scheduled Meds: . antiseptic oral rinse  7 mL Mouth Rinse QID  . ceFEPime (MAXIPIME) IV  1 g Intravenous Q24H  . chlorhexidine gluconate  15 mL Mouth Rinse BID  . feeding supplement (PRO-STAT SUGAR FREE 64)  30 mL Per Tube QID  . free water  100 mL Per Tube 3 times per day  . heparin subcutaneous  5,000 Units Subcutaneous Q12H  . insulin aspart  0-15 Units Subcutaneous 6 times per day  . ipratropium-albuterol  3 mL Nebulization Q6H  . pantoprazole sodium  40 mg Per Tube Daily  . senna-docusate  1 tablet Oral BID  . sodium chloride flush  10-40 mL Intracatheter Q12H   Continuous Infusions: . feeding supplement (  VITAL HIGH PROTEIN) 1,000 mL (08/16/15 1142)  . norepinephrine (LEVOPHED) Adult infusion 14 mcg/min (08/16/15 1007)    Assessment/Plan:  1. Acute cardiac arrest. PEA arrest. Overall prognosis poor. Supportive care at this point. 2. Cardiogenic shock on levophed 3. Likely anoxic encephalopathy. Repeat CT scan is negative. EEG pending. Neurology consultation appreciated. Overall prognosis and quality of life will depend on his mental status. 4. End-stage renal disease on hemodialysis. Nephrology to continue with dialysis. 5. Type 2 diabetes- sliding-scale insulin for now 6. History of chronic systolic heart failure. 7. Peripheral vascular disease with ulcer left medial malleolus. Local wound care. 8. Anemia of chronic disease on Epogen with dialysis 9. Nutrition tube feeding to be started 10. Patient is a full code at this point in time. Case discussed with the family today. We are still waiting for the EEG. Overall prognosis is still poor. Family stated that he would not want to live if he  stayed in this condition. 11. Patient placed empirically on antibiotics.  Code Status:     Code Status Orders        Start     Ordered   08/14/15 1149  Full code   Continuous     08/14/15 1149    Code Status History    Date Active Date Inactive Code Status Order ID Comments User Context   05/31/2015  8:23 PM 06/02/2015  7:00 PM Full Code [id number]  Christena Flake, MD Inpatient   05/30/2015  1:08 PM 05/31/2015  8:23 PM Full Code [id number]  Adrian Saran, MD Inpatient     Family Communication: Spoke with wife and family. Disposition Plan: To be determined  Consultants:  Critical care specialist  Cardiology  Nephrology  Neurology  Procedures:  Intubation  Time spent: 35 minutes critical care time  Alford Highland  [facility]

## 2015-08-16 NOTE — Progress Notes (Signed)
EEG completed, results pending. 

## 2015-08-16 NOTE — Progress Notes (Signed)
PULMONARY / CRITICAL CARE MEDICINE   Name: [name] MRN: [id number] DOB: 10-Feb-1929    ADMISSION DATE:  08/14/2015 CONSULTATION DATE: 08/14/15  REQUESTING MD:  Wyn Quaker   ASSESSMENT / PLAN:  21 M with ESRD, prior CVA, prior MI, ESRD on home HD underwent complex angioplasty to LLE. During procedure had problems with hypoxemia, then suffered PEA arrest requiring intubation and 2 mins ACLS.   PULMONARY A: VDRF post cardiac arrest Chest x-ray images reviewed, lungs unremarkable, ET tube is high. P:   Continue current vent settings, we'll consider weaning trial when the patient's mental status is improved. Chest x-ray was reviewed, Advance endotracheal tube by 2 cm.   CARDIOVASCULAR A:  PEA Cardiac arrest - etiology unclear Ischemic CM Severe PVD P:  MAP goal > 65 mmHg   RENAL A:   ESRD on home HD P:   Monitor BMET intermittently Monitor I/Os Correct electrolytes as indicated Nephrology notified of his hospitalization  GASTROINTESTINAL A:   No acute issues P:   SUP: IV PPI Consider TFs 02/21  HEMATOLOGIC A:   Anemia of chronic disease/CKD P:  DVT px: SQ heparin Monitor CBC intermittently Transfuse per usual ICU guidelines  INFECTIOUS A:   Leukocytosis - likely stress demargination Febrile etiology uncertain. No evidence of acute bacterial infection. LLE ulcers appear clean P:   Monitor temp, WBC count Micro and abx as above  INDWELLING DEVICES::  ETT 02/20 >>  R femoral CVL 02/20 >>   MRSA screen 2/20 negative. Sputum culture 2/22>> pending. Blood culture 2/22>>   ENDOCRINE A:   Initial Hyperglycemia, now appears well controlled. P:   We'll continue SSI q 4 hrs  NEUROLOGIC A:   Post anoxic encephalopathy P:   RASS goal: 0, -1 PAD protocol with intermittent fent, midaz CT head reviewed. No significant change.     MAJOR EVENTS/TEST RESULTS: 02/20  Angioplasty to LLE c/b PEA arrest. Admitted to ICU after 2 mins ACLS. Intubated 02/20  LE venous US:  02/20 CT head:      Allergies  Allergen Reactions  . Penicillins Swelling    No current facility-administered medications on file prior to encounter.   Current Outpatient Prescriptions on File Prior to Encounter  Medication Sig  . aspirin EC 325 MG tablet Take 325 mg by mouth 2 (two) times daily.   . calcium acetate (PHOSLO) 667 MG capsule Take 2 capsules (1,334 mg total) by mouth 3 (three) times daily with meals.  . carbidopa-levodopa (SINEMET IR) 25-100 MG tablet Take 1 tablet by mouth 2 (two) times daily.  . carvedilol (COREG) 3.125 MG tablet Take 1 tablet (3.125 mg total) by mouth 2 (two) times daily with a meal.  . collagenase (SANTYL) ointment Apply 1 application topically daily.  . cyanocobalamin 1000 MCG tablet Take 1,000 mcg by mouth daily.  Marland Kitchen guaifenesin (ROBITUSSIN) 100 MG/5ML syrup Take 200 mg by mouth 3 (three) times daily as needed for cough.  . lovastatin (MEVACOR) 20 MG tablet Take 20 mg by mouth every evening.  . midodrine (PROAMATINE) 10 MG tablet Take 1 tablet by mouth every dialysis.  . Multiple Vitamins-Minerals (RENAPLEX-D PO) Take 1 tablet by mouth every morning.  . Nutritional Supplements (FEEDING SUPPLEMENT, NEPRO CARB STEADY,) LIQD Take 237 mLs by mouth daily.  Marland Kitchen omeprazole (PRILOSEC) 40 MG capsule Take 1 capsule by mouth daily.  Marland Kitchen oxyCODONE (OXY IR/ROXICODONE) 5 MG immediate release tablet Take 1-2 tablets (5-10 mg total) by mouth every 4 (four) hours as needed for moderate pain.  Marland Kitchen  SENSIPAR 30 MG tablet Take 1 tablet by mouth daily.  Marland Kitchen azithromycin (ZITHROMAX) 250 MG tablet Take 250 mg by mouth daily. Reported on 08/14/2015  . gemfibrozil (LOPID) 600 MG tablet Take 1 tablet by mouth 2 (two) times daily. Reported on 08/14/2015    FAMILY HISTORY:  His has no family status information on file.   SOCIAL HISTORY: He  reports that he quit smoking about 46 years ago. He does not have any smokeless tobacco history on file. He reports that he does  not drink alcohol or use illicit drugs.  REVIEW OF SYSTEMS:   Unable to obtain  SUBJECTIVE:   VITAL SIGNS: BP 117/45 mmHg  Pulse 70  Temp(Src) 100.4 F (38 C) (Oral)  Resp 17  Ht  (1.753 m)  Wt 212 lb 11.9 oz (96.5 kg)  BMI 31.40 kg/m2  SpO2 100%  HEMODYNAMICS: CVP:  [13 mmHg-17 mmHg] 13 mmHg  VENTILATOR SETTINGS: Vent Mode:  [-] PRVC FiO2 (%):  [35 %] 35 % Set Rate:  [15 bmp] 15 bmp Vt Set:  [500 mL] 500 mL PEEP:  [5 cmH20] 5 cmH20 Plateau Pressure:  [22 cmH20] 22 cmH20  INTAKE / OUTPUT: I/O last 3 completed shifts: In: 375 [P.O.:20; I.V.:355] Out: 1500 [Other:1500]  PHYSICAL EXAMINATION: General: RASS -4, chronically ill appearing Neuro: pupils unequal but react to light, corneals present, eyes open, we will look at examiner. Will not follow commands. HEENT:  Pupils equal, react to light Cardiovascular: regular (paced), no M Lungs: diminished BS, prolonged exp time, no wheezes noted Abdomen: soft, NT, +BS Ext: chronic stasis changes, trace to 1+ symmetric edema, ulceration over L medial and alteral malleoli Skin: xerodermia, chronic stasis dermatitis  LABS:  BMET  Recent Labs Lab 08/14/15 1235 08/14/15 1236 08/15/15 0525 08/16/15 0508  NA 137  --  137 138  K 5.1  --  5.5* 4.2  CL 96*  --  96* 97*  CO2 24  --  23 28  BUN 87*  --  102* 60*  CREATININE 8.94* 8.81* 9.98* 6.78*  GLUCOSE 142*  --  93 140*    Electrolytes  Recent Labs Lab 08/14/15 1235 08/14/15 1236 08/15/15 0525 08/16/15 0508  CALCIUM 9.3  --  8.9 8.8*  MG  --  2.2  --   --   PHOS  --  7.3*  --   --     CBC  Recent Labs Lab 08/14/15 1235 08/15/15 0525  WBC 25.0* 15.3*  HGB 8.7* 8.6*  HCT 27.7* 26.9*  PLT 346 328    Coag's No results for input(s): APTT, INR in the last 168 hours.  Sepsis Markers No results for input(s): LATICACIDVEN, PROCALCITON, O2SATVEN in the last 168 hours.  ABG  Recent Labs Lab 08/14/15 1144 08/15/15 0400 08/16/15 0445  PHART  7.30* 7.35 7.43  PCO2ART 50* 43 43  PO2ART 432* 128* 141*    Liver Enzymes  Recent Labs Lab 08/14/15 1236  AST 34  ALT 6*  ALKPHOS 98  BILITOT 1.2  ALBUMIN 3.0*    Cardiac Enzymes  Recent Labs Lab 08/14/15 1235 08/14/15 2310 08/15/15 0525  TROPONINI 0.28* 1.97* 1.60*    Glucose  Recent Labs Lab 08/15/15 1120 08/15/15 1555 08/15/15 1942 08/16/15 0015 08/16/15 0420 08/16/15 0724  GLUCAP 96 105* 102* 109* 136* 120*   EKG: paced rhythm. No acute changes   CXR: continued cardiomegaly, vasc congestion   Critical Care Attestation.  I have personally obtained a history, examined the patient, evaluated  laboratory and imaging results, formulated the assessment and plan and placed orders. The Patient requires high complexity decision making for assessment and support, frequent evaluation and titration of therapies, application of advanced monitoring technologies and extensive interpretation of multiple databases. The patient has critical illness that could lead imminently to failure of 1 or more organ systems and requires the highest level of physician preparedness to intervene.  Critical Care Time devoted to patient care services described in this note is 35 minutes and is exclusive of time spent in procedures.

## 2015-08-16 NOTE — Progress Notes (Signed)
Nutrition Follow-up    INTERVENTION:   EN: recommend increasing to 30 ml/hr with goal of 40 ml/hr, continue Prostat QID; meets 100% estimated needs per ASPEN guidelines   NUTRITION DIAGNOSIS:   Inadequate oral intake related to acute illness as evidenced by NPO status. Being addressed via TF  GOAL:   Provide needs based on ASPEN/SCCM guidelines  MONITOR:    (Energy Intake, Anthropometrics, Digestive System, Electrolyte/Renal Profile, Glucose Profile, Pulmonary)  REASON FOR ASSESSMENT:   Ventilator, Consult Enteral/tube feeding initiation and management  ASSESSMENT:    Pt remains on vent, no sedation, minimally responsive, on levophed (16 mcg/min)  Diet Order:  Diet NPO time specified   EN: tolerating Vital High Protein at rate of 20 ml/hr  Digestive System: abdomen distended/obese, no BM, bowel regimen being added   Recent Labs Lab 08/14/15 1235 08/14/15 1236 08/15/15 0525 08/16/15 0508  NA 137  --  137 138  K 5.1  --  5.5* 4.2  CL 96*  --  96* 97*  CO2 24  --  23 28  BUN 87*  --  102* 60*  CREATININE 8.94* 8.81* 9.98* 6.78*  CALCIUM 9.3  --  8.9 8.8*  MG  --  2.2  --   --   PHOS  --  7.3*  --   --   GLUCOSE 142*  --  93 140*    Glucose Profile:  Recent Labs  08/16/15 0015 08/16/15 0420 08/16/15 0724  GLUCAP 109* 136* 120*   Meds: ss novolog, senna-docusate  Height:   Ht Readings from Last 1 Encounters:  08/14/15  (1.753 m)    Weight:   Wt Readings from Last 1 Encounters:  08/15/15 212 lb 11.9 oz (96.5 kg)    Filed Weights   08/14/15 1730 08/15/15 1615 08/15/15 1945  Weight: 216 lb 7.9 oz (98.2 kg) 216 lb 7.9 oz (98.2 kg) 212 lb 11.9 oz (96.5 kg)    BMI:  Body mass index is 31.4 kg/(m^2).  Estimated Nutritional Needs:   Kcal:  1080-1375 kcals (11-14 kcals/kg) using wt of 98.2 kg  Protein:  146 g/kg (2.0 g/kg) using IBW 72.7 kg  Fluid:  1825-2190 mL (25-30 ml/kg)   HIGH Care Level  Romelle Starcher MS, RD, LDN [phone] Pager  [phone] Weekend/On-Call Pager

## 2015-08-16 NOTE — Therapy (Signed)
Patient transported to and from CT scan via transport vent at ordered settings. MVB with patient, uneventful trip. Patient back in room and on Servo vent. HR and SpO2 stable throughout.

## 2015-08-16 NOTE — Progress Notes (Signed)
Pt remains neurologically unresponsive. Pt will open eyes spontaneously, flexion to pain in lower extremities (during left foot wound change), but does not follow commands. Head CT and EEG were performed today. Lung sounds are very course in the upper lobes and diminished in the lower with copious amount of secretions are tan and bloody. Pt remains on full ventilator support at 30%. Tube feeds are currently at 35 and advancing to goal of 40. Pt has continued to require the use of Levophed, and is currently at 12 mcgs for a MAP > 65. Report was given to Erie Noe, Charity fundraiser.

## 2015-08-16 NOTE — Progress Notes (Signed)
Pt  Is responsive only painful stimulai.. No  Other movements noted.lung sound course.thick tan secretions  Up on suctioning. Levophed drip is in progress 12 mcg. Pt is anuric. Tube feeding started . Tolerating good. Pt remains on vent. Vent settings as charted.will continue to observe closely.

## 2015-08-16 NOTE — Progress Notes (Signed)
[facility]  Daily Progress Note   Subjective  - 2 Days Post-Op  Opens eyes today BP better but still on Levophed sats 100% on the vent, ABG looked good Family not at bedside this am  Objective Filed Vitals:   08/16/15 0300 08/16/15 0400 08/16/15 0500 08/16/15 0600  BP: 123/42 124/44 116/43 110/42  Pulse: 78 76 76 72  Temp:   100.4 F (38 C)   TempSrc:   Oral   Resp: Height:      Weight:      SpO2: 100% 100% 100% 100%    Intake/Output Summary (Last 24 hours) at 08/16/15 0805 Last data filed at 08/15/15 1945  Gross per 24 hour  Intake 199.16 ml  Output   1500 ml  Net -1300.84 ml    PULM  CTAB CV  RRR VASC  Foot warm, good cap refill  Laboratory CBC    Component Value Date/Time   WBC 15.3* 08/15/2015 0525   WBC 14.6* 01/11/2014 1412   HGB 8.6* 08/15/2015 0525   HGB 12.2* 01/11/2014 1412   HCT 26.9* 08/15/2015 0525   HCT 38.4* 01/11/2014 1412   PLT 328 08/15/2015 0525   PLT 203 01/11/2014 1412    BMET    Component Value Date/Time   NA 138 08/16/2015 0508   NA 140 01/11/2014 1412   K 4.2 08/16/2015 0508   K 5.4* 01/11/2014 1412   CL 97* 08/16/2015 0508   CL 101 01/11/2014 1412   CO2 28 08/16/2015 0508   CO2 28 01/11/2014 1412   GLUCOSE 140* 08/16/2015 0508   GLUCOSE 131* 01/11/2014 1412   BUN 60* 08/16/2015 0508   BUN 73* 01/11/2014 1412   CREATININE 6.78* 08/16/2015 0508   CREATININE 7.96* 01/11/2014 1412   CALCIUM 8.8* 08/16/2015 0508   CALCIUM 8.2* 01/11/2014 1412   GFRNONAA 7* 08/16/2015 0508   GFRNONAA 6* 01/11/2014 1412   GFRAA 8* 08/16/2015 0508   GFRAA 7* 01/11/2014 1412    Assessment/Planning: POD #2 s/p LLE revascularization with PEA arrest and about 2 minutes of CPR Is opening his eyes some today and more arousable, but still not following full commands Appreciate neurology, critical car, medicine input BP better.  Likely can wean off or down on pressors today Dialysis when nephrology service  thinks appropriate Continue supportive care over next day or two to see what his neurologic level will be    DEW,JASON  08/16/2015, 8:05 AM

## 2015-08-16 NOTE — Progress Notes (Signed)
Pharmacy Antibiotic Note  [name] is a 82 y.o. male admitted on 08/14/2015 with pneumonia.  Pharmacy has been consulted for cefepime dosing.  Plan: Cefepime 1g IV Q24hr.    Height:  (175.3 cm) Weight:  (The weight scale on the bed is not working.) IBW/kg (Calculated) : 70.7  Temp (24hrs), Avg:100.3 F (37.9 C), Min:99.2 F (37.3 C), Max:100.7 F (38.2 C)   Recent Labs Lab 08/14/15 1235 08/14/15 1236 08/15/15 0525 08/16/15 0508  WBC 25.0*  --  15.3*  --   CREATININE 8.94* 8.81* 9.98* 6.78*    Estimated Creatinine Clearance: 9.3 mL/min (by C-G formula based on Cr of 6.78).    Allergies  Allergen Reactions  . Penicillins Swelling    Antimicrobials this admission: Cefepime 2/22 >>    Microbiology results: 2/22 BCx x 2: pending  2/22 Sputum: pending  2/20 MRSA PCR: negative   Pharmacy will continue to monitor and adjust per consult.    Latonyia Lopata L 08/16/2015 4:26 PM

## 2015-08-16 NOTE — Progress Notes (Signed)
Subjective:  Patient is critically ill Remains vent dependent Tolerated dialysis well yesterday 1500 cc removed   Objective:  Vital signs in last 24 hours:  Temp:  [99.2 F (37.3 C)-100.6 F (38.1 C)] 100.6 F (38.1 C) (02/22 0800) Pulse Rate:  [65-78] 70 (02/22 0900) Resp:  [15-19] 17 (02/22 0900) BP: (100-133)/(40-58) 117/45 mmHg (02/22 0900) SpO2:  [99 %-100 %] 100 % (02/22 0900) FiO2 (%):  [30 %-35 %] 30 % (02/22 1200) Weight:  [96.5 kg (212 lb 11.9 oz)-98.2 kg (216 lb 7.9 oz)] 96.5 kg (212 lb 11.9 oz) (02/21 1945)  Weight change: 3.398 kg (7 lb 7.9 oz) Filed Weights   08/14/15 1730 08/15/15 1615 08/15/15 1945  Weight: 98.2 kg (216 lb 7.9 oz) 98.2 kg (216 lb 7.9 oz) 96.5 kg (212 lb 11.9 oz)    Intake/Output:    Intake/Output Summary (Last 24 hours) at 08/16/15 1304 Last data filed at 08/16/15 0932  Gross per 24 hour  Intake 413.26 ml  Output   1500 ml  Net -1086.74 ml     Physical Exam: General: Critically ill appearing  HEENT ETT, OGT,    Neck No mass  Pulm/lungs Vent assisted  CVS/Heart Paced rhythm, irregular  Abdomen:  Soft, NT, ND  Extremities: + dependent  edema  Neurologic: sedated  Skin: eccymosis  Access: Left arm AVF       Basic Metabolic Panel:   Recent Labs Lab 08/14/15 1235 08/14/15 1236 08/15/15 0525 08/16/15 0508  NA 137  --  137 138  K 5.1  --  5.5* 4.2  CL 96*  --  96* 97*  CO2 24  --  23 28  GLUCOSE 142*  --  93 140*  BUN 87*  --  102* 60*  CREATININE 8.94* 8.81* 9.98* 6.78*  CALCIUM 9.3  --  8.9 8.8*  MG  --  2.2  --   --   PHOS  --  7.3*  --   --      CBC:  Recent Labs Lab 08/14/15 1235 08/15/15 0525  WBC 25.0* 15.3*  NEUTROABS 23.0*  --   HGB 8.7* 8.6*  HCT 27.7* 26.9*  MCV 91.9 92.6  PLT 346 328      Microbiology:  Recent Results (from the past 720 hour(s))  MRSA PCR Screening     Status: None   Collection Time: 08/14/15 12:35 PM  Result Value Ref Range Status   MRSA by PCR NEGATIVE NEGATIVE  Final    Comment:        The GeneXpert MRSA Assay (FDA approved for NASAL specimens only), is one component of a comprehensive MRSA colonization surveillance program. It is not intended to diagnose MRSA infection nor to guide or monitor treatment for MRSA infections.   Culture, expectorated sputum-assessment     Status: None   Collection Time: 08/16/15 12:25 AM  Result Value Ref Range Status   Specimen Description SPUTUM  Final   Special Requests Normal  Final   Sputum evaluation THIS SPECIMEN IS ACCEPTABLE FOR SPUTUM CULTURE  Final   Report Status 08/16/2015 FINAL  Final    Coagulation Studies: No results for input(s): LABPROT, INR in the last 72 hours.  Urinalysis: No results for input(s): COLORURINE, LABSPEC, PHURINE, GLUCOSEU, HGBUR, BILIRUBINUR, KETONESUR, PROTEINUR, UROBILINOGEN, NITRITE, LEUKOCYTESUR in the last 72 hours.  Invalid input(s): APPERANCEUR    Imaging: Dg Chest 1 View  08/16/2015  CLINICAL DATA:  Acute onset of dyspnea.  Initial encounter. EXAM: CHEST 1 VIEW COMPARISON:  Chest radiograph performed 08/14/2015 FINDINGS: The patient's endotracheal tube is seen ending 6 cm above the carina. An enteric tube is noted extending below the diaphragm. The lungs are mildly hypoexpanded. Vascular crowding and vascular congestion are seen. Mildly increased interstitial markings could reflect minimal interstitial edema. No pleural effusion or pneumothorax is seen. The cardiomediastinal silhouette is mildly enlarged. A pacemaker is noted overlying the left chest wall, with leads ending overlying the right atrium and right ventricle. No acute osseous abnormalities are identified. IMPRESSION: 1. Endotracheal tube seen ending 6 cm above the carina. 2. Lungs mildly hypoexpanded. Vascular congestion and mild cardiomegaly. Mildly increased interstitial markings could reflect minimal interstitial edema. Electronically Signed   By: Roanna Raider M.D.   On: 08/16/2015 03:02   Dg Abd 1  View  08/15/2015  CLINICAL DATA:  Orogastric tube placement. EXAM: ABDOMEN - 1 VIEW COMPARISON:  None. FINDINGS: Tip and side port of the enteric tube are below the diaphragm in the stomach. There is a normal bowel gas pattern in the included upper abdomen. The heart appears enlarged. IMPRESSION: Tip and side port of the enteric tube below the diaphragm in the stomach. Electronically Signed   By: Rubye Oaks M.D.   On: 08/15/2015 22:02   Ct Head Wo Contrast  08/16/2015  CLINICAL DATA:  Responsible only to painful stimuli with no other movements noted, suffered respiratory arrest during left lower extremity angioplasty on 08/14/2015 EXAM: CT HEAD WITHOUT CONTRAST TECHNIQUE: Contiguous axial images were obtained from the base of the skull through the vertex without intravenous contrast. COMPARISON:  08/14/2015 FINDINGS: Fluid in the bilateral mastoid air cells. Inflammatory change left maxillary sinus and to a lesser degree in the ethmoid air cells. These findings are stable. There is no evidence of intracranial hemorrhage or extra-axial fluid. There is diffuse atrophy and white matter disease with no evidence of vascular territory infarct. Stable prominence of the ventricles related to atrophy. IMPRESSION: Involutional changes and mastoid air cell opacification stable. No acute intracranial abnormalities. Electronically Signed   By: Esperanza Heir M.D.   On: 08/16/2015 10:05   Ct Head Wo Contrast  08/14/2015  CLINICAL DATA:  Cough, wheezing and low oxygen saturations noted prior to angiogram. Patient became unresponsive during the procedure and was intubated and resuscitated. EXAM: CT HEAD WITHOUT CONTRAST TECHNIQUE: Contiguous axial images were obtained from the base of the skull through the vertex without intravenous contrast. COMPARISON:  Head CT 03/25/2012 FINDINGS: Stable age related cerebral atrophy, ventriculomegaly and periventricular white matter disease. No extra-axial fluid collections are  identified. No CT findings for acute hemispheric infarction or intracranial hemorrhage. No mass lesions. The brainstem and cerebellum are normal. No acute bony findings. Suspect sinonasal polyposis. Bilateral maxillary sinus mucoperiosteal thickening. Bilateral mastoid effusions and fluid in the right middle ear cavity. The globes are intact. IMPRESSION: 1. Age related cerebral atrophy, ventriculomegaly and periventricular white matter disease. 2. No acute intracranial findings. 3. Paranasal sinus disease. 4. Bilateral mastoid effusions and right middle ear cavity fluid. Electronically Signed   By: Rudie Meyer M.D.   On: 08/14/2015 17:05   US Venous Img Lower Bilateral  08/14/2015  CLINICAL DATA:  Cardiac arrest this morning. Clinical concern for DVT. EXAM: BILATERAL LOWER EXTREMITY VENOUS DOPPLER ULTRASOUND TECHNIQUE: Gray-scale sonography with graded compression, as well as color Doppler and duplex ultrasound were performed to evaluate the lower extremity deep venous systems from the level of the common femoral vein and including the common femoral, femoral, profunda femoral, popliteal and calf  veins including the posterior tibial, peroneal and gastrocnemius veins when visible. The superficial great saphenous vein was also interrogated. Spectral Doppler was utilized to evaluate flow at rest and with distal augmentation maneuvers in the common femoral, femoral and popliteal veins. COMPARISON:  None. FINDINGS: RIGHT LOWER EXTREMITY Common Femoral Vein: No evidence of thrombus. Normal compressibility, respiratory phasicity and response to augmentation. Saphenofemoral Junction: No evidence of thrombus. Normal compressibility and flow on color Doppler imaging. Profunda Femoral Vein: No evidence of thrombus. Normal compressibility and flow on color Doppler imaging. Femoral Vein: No evidence of thrombus. Normal compressibility, respiratory phasicity and response to augmentation. Popliteal Vein: No evidence of  thrombus. Normal compressibility, respiratory phasicity and response to augmentation. Calf Veins: No evidence of thrombus. Normal compressibility and flow on color Doppler imaging. Superficial Great Saphenous Vein: No evidence of thrombus. Normal compressibility and flow on color Doppler imaging. Venous Reflux:  None. Other Findings:  None. LEFT LOWER EXTREMITY Common Femoral Vein: No evidence of thrombus. Normal compressibility, respiratory phasicity and response to augmentation. Saphenofemoral Junction: No evidence of thrombus. Normal compressibility and flow on color Doppler imaging. Profunda Femoral Vein: No evidence of thrombus. Normal compressibility and flow on color Doppler imaging. Femoral Vein: No evidence of thrombus. Normal compressibility, respiratory phasicity and response to augmentation. Popliteal Vein: No evidence of thrombus. Normal compressibility, respiratory phasicity and response to augmentation. Calf Veins: No evidence of thrombus. Normal compressibility and flow on color Doppler imaging. Superficial Great Saphenous Vein: No evidence of thrombus. Normal compressibility and flow on color Doppler imaging. Venous Reflux:  None. Other Findings:  None. IMPRESSION: No evidence of deep venous thrombosis. Electronically Signed   By: Beckie Salts M.D.   On: 08/14/2015 15:00     Medications:   . feeding supplement (VITAL HIGH PROTEIN) 1,000 mL (08/16/15 1142)  . norepinephrine (LEVOPHED) Adult infusion 14 mcg/min (08/16/15 1007)   . antiseptic oral rinse  7 mL Mouth Rinse QID  . ceFEPime (MAXIPIME) IV  1 g Intravenous Q24H  . chlorhexidine gluconate  15 mL Mouth Rinse BID  . feeding supplement (PRO-STAT SUGAR FREE 64)  30 mL Per Tube QID  . free water  100 mL Per Tube 3 times per day  . heparin subcutaneous  5,000 Units Subcutaneous Q12H  . insulin aspart  0-15 Units Subcutaneous 6 times per day  . ipratropium-albuterol  3 mL Nebulization Q6H  . pantoprazole sodium  40 mg Per Tube Daily   . senna-docusate  1 tablet Oral BID  . sodium chloride flush  10-40 mL Intracatheter Q12H   acetaminophen, albuterol, fentaNYL (SUBLIMAZE) injection, fentaNYL (SUBLIMAZE) injection, midazolam, midazolam, sodium chloride flush  Assessment/ Plan:  82 y.o. male Mr. ROMYN BOSWELL is a 83 y.o. white male with Parkinson's, coronary artery disease, hyperlipidemia, hypertension, GERD,. 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 80% stenosis of the tibioperoneal trunk with peroneal runoff distally which was diffusely diseased. Occlusion of the anterior tibial artery with reconstitution of the anterior tibial artery in the lower leg, hypoxic PEA arrest 08/14/15, Left ankle fracture: ORIF for left ankle. 12/7 Dr. Joice Lofts,   [facility] Serve [place] Ditto  1. End Stage Renal Disease   - HD tomorrow  2.  Acute resp failure S/P hypoxic PEA arrest - vent assisted  3. Secondary Hyperparathyroidism  - monitor ca/phos this admission  4. Anemia of chronic  kidney disease: hemoglobin 8.6 - epo with hemodialysis treatment   5. dependent edema - UF with HD as tolerated      LOS: 2 [place] 2/22/20171:04 PM

## 2015-08-17 ENCOUNTER — Inpatient Hospital Stay: Payer: Medicare Other

## 2015-08-17 DIAGNOSIS — I469 Cardiac arrest, cause unspecified: Secondary | ICD-10-CM

## 2015-08-17 LAB — BASIC METABOLIC PANEL
Anion gap: 15 (ref 5–15)
BUN: 85 mg/dL — AB (ref 6–20)
CALCIUM: 9.1 mg/dL (ref 8.9–10.3)
CHLORIDE: 95 mmol/L — AB (ref 101–111)
CO2: 28 mmol/L (ref 22–32)
CREATININE: 8.02 mg/dL — AB (ref 0.61–1.24)
GFR calc non Af Amer: 5 mL/min — ABNORMAL LOW (ref >60)
GFR, EST AFRICAN AMERICAN: 6 mL/min — AB (ref >60)
Glucose, Bld: 132 mg/dL — ABNORMAL HIGH (ref 65–99)
Potassium: 4.2 mmol/L (ref 3.5–5.1)
SODIUM: 138 mmol/L (ref 135–145)

## 2015-08-17 LAB — BLOOD GAS, ARTERIAL
ALLENS TEST (PASS/FAIL): POSITIVE — AB
Acid-Base Excess: 3.5 mmol/L — ABNORMAL HIGH (ref 0.0–3.0)
Bicarbonate: 29.1 mEq/L — ABNORMAL HIGH (ref 21.0–28.0)
FIO2: 0.3
LHR: 15 {breaths}/min
O2 Saturation: 98.5 %
PCO2 ART: 47 mmHg (ref 32.0–48.0)
PEEP: 5 cmH2O
Patient temperature: 37
VT: 500 mL
pH, Arterial: 7.4 (ref 7.350–7.450)
pO2, Arterial: 114 mmHg — ABNORMAL HIGH (ref 83.0–108.0)

## 2015-08-17 LAB — HEPATITIS B SURFACE ANTIGEN: HEP B S AG: NEGATIVE

## 2015-08-17 LAB — CBC
HCT: 26.7 % — ABNORMAL LOW (ref 40.0–52.0)
Hemoglobin: 8.7 g/dL — ABNORMAL LOW (ref 13.0–18.0)
MCH: 28.9 pg (ref 26.0–34.0)
MCHC: 32.5 g/dL (ref 32.0–36.0)
MCV: 89 fL (ref 80.0–100.0)
PLATELETS: 308 10*3/uL (ref 150–440)
RBC: 3 MIL/uL — ABNORMAL LOW (ref 4.40–5.90)
RDW: 16.5 % — AB (ref 11.5–14.5)
WBC: 17 10*3/uL — ABNORMAL HIGH (ref 3.8–10.6)

## 2015-08-17 LAB — GLUCOSE, CAPILLARY
GLUCOSE-CAPILLARY: 116 mg/dL — AB (ref 65–99)
GLUCOSE-CAPILLARY: 147 mg/dL — AB (ref 65–99)
GLUCOSE-CAPILLARY: 100 mg/dL — AB (ref 65–99)
Glucose-Capillary: 123 mg/dL — ABNORMAL HIGH (ref 65–99)
Glucose-Capillary: 150 mg/dL — ABNORMAL HIGH (ref 65–99)

## 2015-08-17 NOTE — Progress Notes (Signed)
BP stable levophed decreased from to .

## 2015-08-17 NOTE — Progress Notes (Signed)
Levophed titrated back to due to dialysis.

## 2015-08-17 NOTE — Progress Notes (Signed)
PULMONARY / CRITICAL CARE MEDICINE   Name: [name] MRN: [id number] DOB: 12/03/1928     ASSESSMENT / PLAN:  67 M with ESRD, prior CVA, prior MI, ESRD on home HD underwent complex angioplasty to LLE. During procedure had problems with hypoxemia, then suffered PEA arrest requiring intubation and 2 mins ACLS.   PULMONARY A: VDRF post cardiac arrest Chest x-ray images reviewed, lungs unremarkable.  P:   Continue current vent settings, weaning trial today as tolerated.   NEURO: Anoxic encephalopathy. -Continue to hold sedatives, supportive care.  CARDIOVASCULAR A:  PEA Cardiac arrest - etiology unclear. Continued hypotension, likely cardiogenic. Ischemic CM Severe PVD P:  MAP goal > 65 mmHg Wean down the Levophed as tolerated, will be conservative with fluids. Due to the patient's end-stage renal disease. -DVT prophylaxis: Heparin 5000 units subcutaneous every 12.  RENAL A:   ESRD on home HD P:   Monitor BMET intermittently Monitor I/Os Correct electrolytes as indicated Dialysis per nephrology.  GASTROINTESTINAL A:   No acute issues P:   -GI prophylaxis: IV PPI Continue tube feeds TFs  HEMATOLOGIC A:   Anemia of chronic disease/CKD P:  DVT px: SQ heparin Monitor CBC intermittently Transfuse per usual ICU guidelines  INFECTIOUS A:   Leukocytosis - likely stress demargination Febrile etiology uncertain. No evidence of acute bacterial infection. LLE ulcers appear clean P:   Monitor temp, WBC count Micro and abx as above  INDWELLING DEVICES:: ETT 02/20 >>  R femoral CVL 02/20 >>   Microbiology results: MRSA screen 2/20 negative. Sputum culture 2/22>> negative thus far  Antimicrobials: Cefepime 2/22 >>  blood culture 2/22>> negative thus far     ENDOCRINE A:   Initial Hyperglycemia, now appears well controlled. P:   We'll continue SSI q 4 hrs  NEUROLOGIC A:   Post anoxic encephalopathy. -EEG results reviewed, showed some slowing. P:    RASS goal: 0, -1 PAD protocol with intermittent fent, midaz CT head reviewed. No significant change.     MAJOR EVENTS/TEST RESULTS: 02/20  Angioplasty to LLE c/b PEA arrest. Admitted to ICU after 2 mins ACLS. Intubated 02/20 LE venous US:  02/20 CT head:      Allergies  Allergen Reactions  . Penicillins Swelling    No current facility-administered medications on file prior to encounter.   Current Outpatient Prescriptions on File Prior to Encounter  Medication Sig  . aspirin EC 325 MG tablet Take 325 mg by mouth 2 (two) times daily.   . calcium acetate (PHOSLO) 667 MG capsule Take 2 capsules (1,334 mg total) by mouth 3 (three) times daily with meals.  . carbidopa-levodopa (SINEMET IR) 25-100 MG tablet Take 1 tablet by mouth 2 (two) times daily.  . carvedilol (COREG) 3.125 MG tablet Take 1 tablet (3.125 mg total) by mouth 2 (two) times daily with a meal.  . collagenase (SANTYL) ointment Apply 1 application topically daily.  . cyanocobalamin 1000 MCG tablet Take 1,000 mcg by mouth daily.  Marland Kitchen guaifenesin (ROBITUSSIN) 100 MG/5ML syrup Take 200 mg by mouth 3 (three) times daily as needed for cough.  . lovastatin (MEVACOR) 20 MG tablet Take 20 mg by mouth every evening.  . midodrine (PROAMATINE) 10 MG tablet Take 1 tablet by mouth every dialysis.  . Multiple Vitamins-Minerals (RENAPLEX-D PO) Take 1 tablet by mouth every morning.  . Nutritional Supplements (FEEDING SUPPLEMENT, NEPRO CARB STEADY,) LIQD Take 237 mLs by mouth daily.  Marland Kitchen omeprazole (PRILOSEC) 40 MG capsule Take 1 capsule by mouth daily.  Marland Kitchen  oxyCODONE (OXY IR/ROXICODONE) 5 MG immediate release tablet Take 1-2 tablets (5-10 mg total) by mouth every 4 (four) hours as needed for moderate pain.  . SENSIPAR 30 MG tablet Take 1 tablet by mouth daily.  Marland Kitchen azithromycin (ZITHROMAX) 250 MG tablet Take 250 mg by mouth daily. Reported on 08/14/2015  . gemfibrozil (LOPID) 600 MG tablet Take 1 tablet by mouth 2 (two) times daily. Reported  on 08/14/2015    FAMILY HISTORY:  His has no family status information on file.   SOCIAL HISTORY: He  reports that he quit smoking about 46 years ago. He does not have any smokeless tobacco history on file. He reports that he does not drink alcohol or use illicit drugs.  REVIEW OF SYSTEMS:   Unable to obtain  SUBJECTIVE:   VITAL SIGNS: BP 110/41 mmHg  Pulse 63  Temp(Src) 100.1 F (37.8 C) (Axillary)  Resp 14  Ht  (1.753 m)  Wt 212 lb 11.9 oz (96.5 kg)  BMI 31.40 kg/m2  SpO2 100%  HEMODYNAMICS: CVP:  [6 mmHg-18 mmHg] 12 mmHg  VENTILATOR SETTINGS: Vent Mode:  [-] PRVC FiO2 (%):  [30 %] 30 % Set Rate:  [15 bmp] 15 bmp Vt Set:  [500 mL] 500 mL PEEP:  [5 cmH20] 5 cmH20 Plateau Pressure:  [19 cmH20] 19 cmH20  INTAKE / OUTPUT: I/O last 3 completed shifts: In: 1505.9 [I.V.:493; NG/GT:962.9; IV Piggyback:50] Out: 1500 [Other:1500]  PHYSICAL EXAMINATION: General: RASS -4, chronically ill appearing Neuro: pupils unequal but react to light, corneals present, eyes open,  will look at examiner. Will not follow commands. HEENT:  Pupils equal, react to light Cardiovascular: regular (paced), no M Lungs: diminished BS, prolonged exp time, no wheezes noted Abdomen: soft, NT, +BS Ext: chronic stasis changes, trace to 1+ symmetric edema, ulceration over L medial and alteral malleoli Skin: xerodermia, chronic stasis dermatitis  LABS:  BMET  Recent Labs Lab 08/15/15 0525 08/16/15 0508 08/17/15 0510  NA 137 138 138  K 5.5* 4.2 4.2  CL 96* 97* 95*  CO2 BUN 102* 60* 85*  CREATININE 9.98* 6.78* 8.02*  GLUCOSE 93 140* 132*    Electrolytes  Recent Labs Lab 08/14/15 1236 08/15/15 0525 08/16/15 0508 08/17/15 0510  CALCIUM  --  8.9 8.8* 9.1  MG 2.2  --   --   --   PHOS 7.3*  --   --   --     CBC  Recent Labs Lab 08/14/15 1235 08/15/15 0525 08/17/15 0510  WBC 25.0* 15.3* 17.0*  HGB 8.7* 8.6* 8.7*  HCT 27.7* 26.9* 26.7*  PLT 346 328 308     Coag's No results for input(s): APTT, INR in the last 168 hours.  Sepsis Markers No results for input(s): LATICACIDVEN, PROCALCITON, O2SATVEN in the last 168 hours.  ABG  Recent Labs Lab 08/15/15 0400 08/16/15 0445 08/17/15 0500  PHART 7.35 7.43 7.40  PCO2ART 43 43 47  PO2ART 128* 141* 114*    Liver Enzymes  Recent Labs Lab 08/14/15 1236  AST 34  ALT 6*  ALKPHOS 98  BILITOT 1.2  ALBUMIN 3.0*    Cardiac Enzymes  Recent Labs Lab 08/14/15 1235 08/14/15 2310 08/15/15 0525  TROPONINI 0.28* 1.97* 1.60*    Glucose  Recent Labs Lab 08/16/15 1141 08/16/15 1625 08/16/15 2022 08/16/15 2359 08/17/15 0412 08/17/15 0806  GLUCAP 139* 119* 132* 131* 123* 116*   EKG: paced rhythm. No acute changes   CXR: continued cardiomegaly, vasc congestion  Critical Care Attestation.  I have personally obtained a history, examined the patient, evaluated laboratory and imaging results, formulated the assessment and plan and placed orders. The Patient requires high complexity decision making for assessment and support, frequent evaluation and titration of therapies, application of advanced monitoring technologies and extensive interpretation of multiple databases. The patient has critical illness that could lead imminently to failure of 1 or more organ systems and requires the highest level of physician preparedness to intervene.  Critical Care Time devoted to patient care services described in this note is 35 minutes and is exclusive of time spent in procedures.

## 2015-08-17 NOTE — Progress Notes (Signed)
Patient ID: [name], male   DOB: Jun 03, 1929, 82 y.o.   MRN: [id number] [facility] PROGRESS NOTE  HUSEIN GUEDES WUJ:[id number] DOB: [birth year] DOA: 08/14/2015 PCP: Lauro Regulus., MD  HPI/Subjective: Patient intubated. As per nursing staff he moves his arms when being suctioned. The patient did not have any purposeful movement to my commands today. He opened his eyes but did not track.  Objective: Filed Vitals:   08/17/15 1515 08/17/15 1530  BP: 107/44 112/45  Pulse: 61 60  Temp:    Resp: 19 14    Filed Weights   08/15/15 1615 08/15/15 1945  Weight: 98.2 kg (216 lb 7.9 oz) 96.5 kg (212 lb 11.9 oz)    ROS: Review of Systems  Unable to perform ROS  patient unresponsive Exam: Physical Exam  Constitutional: He appears listless. He is intubated.  HENT:  Lips with small laceration.  Eyes:  Pupils pinpoint. Left medial conjunctival hemorrhage.  Neck: No thyroid mass present.  Cardiovascular: S1 normal and S2 normal.   Murmur heard.  Systolic murmur is present with a grade of 2/6  Pulses:      Dorsalis pedis pulses are 0 on the right side, and 0 on the left side.  Respiratory: He is intubated. He has decreased breath sounds in the right middle field, the right lower field, the left middle field and the left lower field. He has rhonchi in the right middle field, the right lower field, the left middle field and the left lower field.  GI: Soft. Bowel sounds are normal. There is no tenderness.  Musculoskeletal:       Right ankle: He exhibits swelling.       Left ankle: He exhibits swelling.  Neurological: He appears listless.  Tried to open eyes to sternal rub  Skin:  Bruising seen on upper extremity. Small laceration on lip and nose. Stage III decubiti on left foot.  Psychiatric:  Patient tried to open eyes to sternal rub      Data Reviewed: Basic Metabolic Panel:  Recent Labs Lab 08/14/15 1235 08/14/15 1236 08/15/15 0525 08/16/15 0508  08/17/15 0510  NA 137  --  137 138 138  K 5.1  --  5.5* 4.2 4.2  CL 96*  --  96* 97* 95*  CO2 24  --  GLUCOSE 142*  --  93 140* 132*  BUN 87*  --  102* 60* 85*  CREATININE 8.94* 8.81* 9.98* 6.78* 8.02*  CALCIUM 9.3  --  8.9 8.8* 9.1  MG  --  2.2  --   --   --   PHOS  --  7.3*  --   --   --    Liver Function Tests:  Recent Labs Lab 08/14/15 1236  AST 34  ALT 6*  ALKPHOS 98  BILITOT 1.2  PROT 7.0  ALBUMIN 3.0*   CBC:  Recent Labs Lab 08/14/15 1235 08/15/15 0525 08/17/15 0510  WBC 25.0* 15.3* 17.0*  NEUTROABS 23.0*  --   --   HGB 8.7* 8.6* 8.7*  HCT 27.7* 26.9* 26.7*  MCV 91.9 92.6 89.0  PLT 346 328 308   Cardiac Enzymes:  Recent Labs Lab 08/14/15 1235 08/14/15 2310 08/15/15 0525  TROPONINI 0.28* 1.97* 1.60*    CBG:  Recent Labs Lab 08/16/15 2022 08/16/15 2359 08/17/15 0412 08/17/15 0806 08/17/15 1136  GLUCAP 132* 131* 123* 116* 147*    Recent Results (from the past 240 hour(s))  MRSA PCR Screening  Status: None   Collection Time: 08/14/15 12:35 PM  Result Value Ref Range Status   MRSA by PCR NEGATIVE NEGATIVE Final    Comment:        The GeneXpert MRSA Assay (FDA approved for NASAL specimens only), is one component of a comprehensive MRSA colonization surveillance program. It is not intended to diagnose MRSA infection nor to guide or monitor treatment for MRSA infections.   Culture, expectorated sputum-assessment     Status: None   Collection Time: 08/16/15 12:25 AM  Result Value Ref Range Status   Specimen Description SPUTUM  Final   Special Requests Normal  Final   Sputum evaluation THIS SPECIMEN IS ACCEPTABLE FOR SPUTUM CULTURE  Final   Report Status 08/16/2015 FINAL  Final  Culture, respiratory (NON-Expectorated)     Status: None (Preliminary result)   Collection Time: 08/16/15 12:25 AM  Result Value Ref Range Status   Specimen Description SPUTUM  Final   Special Requests Normal Reflexed from [id number]  Final   Gram  Stain   Final    FEW WBC SEEN NO SQUAMOUS EPITHELIAL CELLS PRESENT FEW GRAM POSITIVE RODS RARE GRAM NEGATIVE RODS EXCELLENT SPECIMEN - 90-100% WBCS    Culture   Final    LIGHT GROWTH GRAM NEGATIVE RODS IDENTIFICATION AND SUSCEPTIBILITIES TO FOLLOW    Report Status PENDING  Incomplete  Culture, blood (Routine X 2) w Reflex to ID Panel     Status: None (Preliminary result)   Collection Time: 08/16/15 10:51 AM  Result Value Ref Range Status   Specimen Description BLOOD RIGHT HAND  Final   Special Requests   Final    BOTTLES DRAWN AEROBIC AND ANAEROBIC ANA AER   Culture NO GROWTH < 24 HOURS  Final   Report Status PENDING  Incomplete  Culture, blood (Routine X 2) w Reflex to ID Panel     Status: None (Preliminary result)   Collection Time: 08/16/15 11:09 AM  Result Value Ref Range Status   Specimen Description BLOOD RIGHT HAND  Final   Special Requests BOTTLES DRAWN AEROBIC AND ANAEROBIC  Final   Culture NO GROWTH < 24 HOURS  Final   Report Status PENDING  Incomplete  Culture, expectorated sputum-assessment     Status: None   Collection Time: 08/16/15 12:15 PM  Result Value Ref Range Status   Specimen Description EXPECTORATED SPUTUM  Final   Special Requests NONE  Final   Sputum evaluation THIS SPECIMEN IS ACCEPTABLE FOR SPUTUM CULTURE  Final   Report Status 08/16/2015 FINAL  Final  Culture, respiratory (NON-Expectorated)     Status: None (Preliminary result)   Collection Time: 08/16/15 12:15 PM  Result Value Ref Range Status   Specimen Description EXPECTORATED SPUTUM  Final   Special Requests NONE Reflexed from [id number]  Final   Gram Stain   Final    FEW WBC SEEN FEW SQUAMOUS EPITHELIAL CELLS PRESENT MODERATE GRAM POSITIVE RODS FEW GRAM NEGATIVE RODS FEW GRAM POSITIVE COCCI GOOD SPECIMEN - 80-90% WBCS    Culture   Final    HEAVY GROWTH GRAM NEGATIVE RODS IDENTIFICATION AND SUSCEPTIBILITIES TO FOLLOW    Report Status PENDING  Incomplete     Studies: Dg  Chest 1 View  08/17/2015  CLINICAL DATA:  Dyspnea EXAM: CHEST 1 VIEW COMPARISON:  08/16/2015 FINDINGS: Endotracheal tube in satisfactory position. NG tube in the stomach. Dual lead pacemaker unchanged Hypoventilation with bibasilar atelectasis similar to the prior study. Negative for heart failure or effusion. IMPRESSION: Endotracheal  tube in good position. Hypoventilation with bibasilar atelectasis is unchanged. Electronically Signed   By: Marlan Palau M.D.   On: 08/17/2015 07:07   Dg Chest 1 View  08/16/2015  CLINICAL DATA:  Acute onset of dyspnea.  Initial encounter. EXAM: CHEST 1 VIEW COMPARISON:  Chest radiograph performed 08/14/2015 FINDINGS: The patient's endotracheal tube is seen ending 6 cm above the carina. An enteric tube is noted extending below the diaphragm. The lungs are mildly hypoexpanded. Vascular crowding and vascular congestion are seen. Mildly increased interstitial markings could reflect minimal interstitial edema. No pleural effusion or pneumothorax is seen. The cardiomediastinal silhouette is mildly enlarged. A pacemaker is noted overlying the left chest wall, with leads ending overlying the right atrium and right ventricle. No acute osseous abnormalities are identified. IMPRESSION: 1. Endotracheal tube seen ending 6 cm above the carina. 2. Lungs mildly hypoexpanded. Vascular congestion and mild cardiomegaly. Mildly increased interstitial markings could reflect minimal interstitial edema. Electronically Signed   By: Roanna Raider M.D.   On: 08/16/2015 03:02   Dg Abd 1 View  08/15/2015  CLINICAL DATA:  Orogastric tube placement. EXAM: ABDOMEN - 1 VIEW COMPARISON:  None. FINDINGS: Tip and side port of the enteric tube are below the diaphragm in the stomach. There is a normal bowel gas pattern in the included upper abdomen. The heart appears enlarged. IMPRESSION: Tip and side port of the enteric tube below the diaphragm in the stomach. Electronically Signed   By: Rubye Oaks M.D.    On: 08/15/2015 22:02   Ct Head Wo Contrast  08/16/2015  CLINICAL DATA:  Responsible only to painful stimuli with no other movements noted, suffered respiratory arrest during left lower extremity angioplasty on 08/14/2015 EXAM: CT HEAD WITHOUT CONTRAST TECHNIQUE: Contiguous axial images were obtained from the base of the skull through the vertex without intravenous contrast. COMPARISON:  08/14/2015 FINDINGS: Fluid in the bilateral mastoid air cells. Inflammatory change left maxillary sinus and to a lesser degree in the ethmoid air cells. These findings are stable. There is no evidence of intracranial hemorrhage or extra-axial fluid. There is diffuse atrophy and white matter disease with no evidence of vascular territory infarct. Stable prominence of the ventricles related to atrophy. IMPRESSION: Involutional changes and mastoid air cell opacification stable. No acute intracranial abnormalities. Electronically Signed   By: Esperanza Heir M.D.   On: 08/16/2015 10:05    Scheduled Meds: . antiseptic oral rinse  7 mL Mouth Rinse QID  . ceFEPime (MAXIPIME) IV  1 g Intravenous Q24H  . chlorhexidine gluconate  15 mL Mouth Rinse BID  . feeding supplement (PRO-STAT SUGAR FREE 64)  30 mL Per Tube QID  . free water  100 mL Per Tube 3 times per day  . heparin subcutaneous  5,000 Units Subcutaneous Q12H  . insulin aspart  0-15 Units Subcutaneous 6 times per day  . ipratropium-albuterol  3 mL Nebulization Q6H  . pantoprazole sodium  40 mg Per Tube Daily  . senna-docusate  1 tablet Oral BID  . sodium chloride flush  10-40 mL Intracatheter Q12H   Continuous Infusions: . feeding supplement (VITAL HIGH PROTEIN) 1,000 mL (08/17/15 0600)  . norepinephrine (LEVOPHED) Adult infusion 10.987 mcg/min (08/17/15 0600)    Assessment/Plan:  1. Acute cardiac arrest. PEA arrest. Overall prognosis poor. Supportive care at this point. 2. Cardiogenic shock on levophed 3. Likely anoxic encephalopathy. Repeat CT scan is  negative. EEG showed generalized slowing and no seizure activity. Neurology consultation appreciated. Overall prognosis and quality of  life will depend on his mental status. Right now no purposeful movement. 4. End-stage renal disease on hemodialysis. Nephrology to continue with dialysis. 5. Type 2 diabetes- sliding-scale insulin for now 6. History of chronic systolic heart failure. 7. Peripheral vascular disease with ulcer left medial malleolus. Local wound care. 8. Anemia of chronic disease on Epogen with dialysis 9. Nutrition tube feeding to be started 10. Patient placed empirically on antibiotics. 11. Long discussion with the patient's son and wife in the waiting room. The patient was made a DO NOT RESUSCITATE. They stated that he would not want to live on a ventilator and in this type of state. They likely will not proceed with a tracheostomy. They will consider terminal extubation if he does not improve.  Code Status: Patient made a DO NOT RESUSCITATE today.           Family Communication: Spoke with wife and son Disposition Plan: To be determined  Consultants:  Critical care specialist  Cardiology  Nephrology  Neurology  Procedures:  Intubation  Time spent: 35 minutes critical care time and in coordination of care with family. Patient is critically ill and the high risk for cardiopulmonary arrest. She  Alford Highland  [facility]

## 2015-08-17 NOTE — Progress Notes (Signed)
Subjective:  Patient is critically ill Remains vent dependent Levophed being weaned off   Objective:  Vital signs in last 24 hours:  Temp:  [100 F (37.8 C)-100.9 F (38.3 C)] 100 F (37.8 C) (02/23 0900) Pulse Rate:  [59-75] 60 (02/23 1100) Resp:  [14-19] 18 (02/23 1100) BP: (107-125)/(41-51) 108/44 mmHg (02/23 1100) SpO2:  [99 %-100 %] 100 % (02/23 1100) FiO2 (%):  [30 %] 30 % (02/23 1103)  Weight change:  Filed Weights   08/14/15 1730 08/15/15 1615 08/15/15 1945  Weight: 98.2 kg (216 lb 7.9 oz) 98.2 kg (216 lb 7.9 oz) 96.5 kg (212 lb 11.9 oz)    Intake/Output:    Intake/Output Summary (Last 24 hours) at 08/17/15 1123 Last data filed at 08/17/15 0800  Gross per 24 hour  Intake 1362.41 ml  Output      0 ml  Net 1362.41 ml     Physical Exam: General: Critically ill appearing  HEENT ETT, OGT,    Neck No mass  Pulm/lungs Vent assisted  CVS/Heart Paced rhythm, irregular  Abdomen:  Soft, NT, ND  Extremities: + dependent  edema  Neurologic: Open eyes, not following commands  Skin: eccymosis  Access: Left arm AVF       Basic Metabolic Panel:   Recent Labs Lab 08/14/15 1235 08/14/15 1236 08/15/15 0525 08/16/15 0508 08/17/15 0510  NA 137  --  137 138 138  K 5.1  --  5.5* 4.2 4.2  CL 96*  --  96* 97* 95*  CO2 24  --  GLUCOSE 142*  --  93 140* 132*  BUN 87*  --  102* 60* 85*  CREATININE 8.94* 8.81* 9.98* 6.78* 8.02*  CALCIUM 9.3  --  8.9 8.8* 9.1  MG  --  2.2  --   --   --   PHOS  --  7.3*  --   --   --      CBC:  Recent Labs Lab 08/14/15 1235 08/15/15 0525 08/17/15 0510  WBC 25.0* 15.3* 17.0*  NEUTROABS 23.0*  --   --   HGB 8.7* 8.6* 8.7*  HCT 27.7* 26.9* 26.7*  MCV 91.9 92.6 89.0  PLT 346 328 308      Microbiology:  Recent Results (from the past 720 hour(s))  MRSA PCR Screening     Status: None   Collection Time: 08/14/15 12:35 PM  Result Value Ref Range Status   MRSA by PCR NEGATIVE NEGATIVE Final    Comment:         The GeneXpert MRSA Assay (FDA approved for NASAL specimens only), is one component of a comprehensive MRSA colonization surveillance program. It is not intended to diagnose MRSA infection nor to guide or monitor treatment for MRSA infections.   Culture, expectorated sputum-assessment     Status: None   Collection Time: 08/16/15 12:25 AM  Result Value Ref Range Status   Specimen Description SPUTUM  Final   Special Requests Normal  Final   Sputum evaluation THIS SPECIMEN IS ACCEPTABLE FOR SPUTUM CULTURE  Final   Report Status 08/16/2015 FINAL  Final  Culture, respiratory (NON-Expectorated)     Status: None (Preliminary result)   Collection Time: 08/16/15 12:25 AM  Result Value Ref Range Status   Specimen Description SPUTUM  Final   Special Requests Normal Reflexed from [id number]  Final   Gram Stain PENDING  Incomplete   Culture   Final    LIGHT GROWTH GRAM NEGATIVE RODS IDENTIFICATION AND  SUSCEPTIBILITIES TO FOLLOW    Report Status PENDING  Incomplete  Culture, blood (Routine X 2) w Reflex to ID Panel     Status: None (Preliminary result)   Collection Time: 08/16/15 10:51 AM  Result Value Ref Range Status   Specimen Description BLOOD RIGHT HAND  Final   Special Requests   Final    BOTTLES DRAWN AEROBIC AND ANAEROBIC ANA AER   Culture NO GROWTH < 24 HOURS  Final   Report Status PENDING  Incomplete  Culture, blood (Routine X 2) w Reflex to ID Panel     Status: None (Preliminary result)   Collection Time: 08/16/15 11:09 AM  Result Value Ref Range Status   Specimen Description BLOOD RIGHT HAND  Final   Special Requests BOTTLES DRAWN AEROBIC AND ANAEROBIC  Final   Culture NO GROWTH < 24 HOURS  Final   Report Status PENDING  Incomplete  Culture, expectorated sputum-assessment     Status: None   Collection Time: 08/16/15 12:15 PM  Result Value Ref Range Status   Specimen Description EXPECTORATED SPUTUM  Final   Special Requests NONE  Final   Sputum evaluation THIS  SPECIMEN IS ACCEPTABLE FOR SPUTUM CULTURE  Final   Report Status 08/16/2015 FINAL  Final  Culture, respiratory (NON-Expectorated)     Status: None (Preliminary result)   Collection Time: 08/16/15 12:15 PM  Result Value Ref Range Status   Specimen Description EXPECTORATED SPUTUM  Final   Special Requests NONE Reflexed from [id number]  Final   Gram Stain PENDING  Incomplete   Culture   Final    HEAVY GROWTH GRAM NEGATIVE RODS IDENTIFICATION AND SUSCEPTIBILITIES TO FOLLOW    Report Status PENDING  Incomplete    Coagulation Studies: No results for input(s): LABPROT, INR in the last 72 hours.  Urinalysis: No results for input(s): COLORURINE, LABSPEC, PHURINE, GLUCOSEU, HGBUR, BILIRUBINUR, KETONESUR, PROTEINUR, UROBILINOGEN, NITRITE, LEUKOCYTESUR in the last 72 hours.  Invalid input(s): APPERANCEUR    Imaging: Dg Chest 1 View  08/17/2015  CLINICAL DATA:  Dyspnea EXAM: CHEST 1 VIEW COMPARISON:  08/16/2015 FINDINGS: Endotracheal tube in satisfactory position. NG tube in the stomach. Dual lead pacemaker unchanged Hypoventilation with bibasilar atelectasis similar to the prior study. Negative for heart failure or effusion. IMPRESSION: Endotracheal tube in good position. Hypoventilation with bibasilar atelectasis is unchanged. Electronically Signed   By: Marlan Palau M.D.   On: 08/17/2015 07:07   Dg Chest 1 View  08/16/2015  CLINICAL DATA:  Acute onset of dyspnea.  Initial encounter. EXAM: CHEST 1 VIEW COMPARISON:  Chest radiograph performed 08/14/2015 FINDINGS: The patient's endotracheal tube is seen ending 6 cm above the carina. An enteric tube is noted extending below the diaphragm. The lungs are mildly hypoexpanded. Vascular crowding and vascular congestion are seen. Mildly increased interstitial markings could reflect minimal interstitial edema. No pleural effusion or pneumothorax is seen. The cardiomediastinal silhouette is mildly enlarged. A pacemaker is noted overlying the left chest wall,  with leads ending overlying the right atrium and right ventricle. No acute osseous abnormalities are identified. IMPRESSION: 1. Endotracheal tube seen ending 6 cm above the carina. 2. Lungs mildly hypoexpanded. Vascular congestion and mild cardiomegaly. Mildly increased interstitial markings could reflect minimal interstitial edema. Electronically Signed   By: Roanna Raider M.D.   On: 08/16/2015 03:02   Dg Abd 1 View  08/15/2015  CLINICAL DATA:  Orogastric tube placement. EXAM: ABDOMEN - 1 VIEW COMPARISON:  None. FINDINGS: Tip and side port of the enteric  tube are below the diaphragm in the stomach. There is a normal bowel gas pattern in the included upper abdomen. The heart appears enlarged. IMPRESSION: Tip and side port of the enteric tube below the diaphragm in the stomach. Electronically Signed   By: Rubye Oaks M.D.   On: 08/15/2015 22:02   Ct Head Wo Contrast  08/16/2015  CLINICAL DATA:  Responsible only to painful stimuli with no other movements noted, suffered respiratory arrest during left lower extremity angioplasty on 08/14/2015 EXAM: CT HEAD WITHOUT CONTRAST TECHNIQUE: Contiguous axial images were obtained from the base of the skull through the vertex without intravenous contrast. COMPARISON:  08/14/2015 FINDINGS: Fluid in the bilateral mastoid air cells. Inflammatory change left maxillary sinus and to a lesser degree in the ethmoid air cells. These findings are stable. There is no evidence of intracranial hemorrhage or extra-axial fluid. There is diffuse atrophy and white matter disease with no evidence of vascular territory infarct. Stable prominence of the ventricles related to atrophy. IMPRESSION: Involutional changes and mastoid air cell opacification stable. No acute intracranial abnormalities. Electronically Signed   By: Esperanza Heir M.D.   On: 08/16/2015 10:05     Medications:   . feeding supplement (VITAL HIGH PROTEIN) 1,000 mL (08/17/15 0600)  . norepinephrine (LEVOPHED)  Adult infusion 10.987 mcg/min (08/17/15 0600)   . antiseptic oral rinse  7 mL Mouth Rinse QID  . ceFEPime (MAXIPIME) IV  1 g Intravenous Q24H  . chlorhexidine gluconate  15 mL Mouth Rinse BID  . feeding supplement (PRO-STAT SUGAR FREE 64)  30 mL Per Tube QID  . free water  100 mL Per Tube 3 times per day  . heparin subcutaneous  5,000 Units Subcutaneous Q12H  . insulin aspart  0-15 Units Subcutaneous 6 times per day  . ipratropium-albuterol  3 mL Nebulization Q6H  . pantoprazole sodium  40 mg Per Tube Daily  . senna-docusate  1 tablet Oral BID  . sodium chloride flush  10-40 mL Intracatheter Q12H   acetaminophen, albuterol, fentaNYL (SUBLIMAZE) injection, fentaNYL (SUBLIMAZE) injection, midazolam, midazolam, sodium chloride flush  Assessment/ Plan:  82 y.o. male Mr. DAYVON DAX is a 82 y.o. white male with Parkinson's, coronary artery disease, hyperlipidemia, hypertension, GERD,. 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 80% stenosis of the tibioperoneal trunk with peroneal runoff distally which was diffusely diseased. Occlusion of the anterior tibial artery with reconstitution of the anterior tibial artery in the lower leg, hypoxic PEA arrest 08/14/15, Left ankle fracture: ORIF for left ankle. 12/7 Dr. Joice Lofts,   [facility] Serve [place] Ditto  1. End Stage Renal Disease   - HD today  2.  Acute resp failure S/P hypoxic PEA arrest - vent assisted  3. Secondary Hyperparathyroidism  - monitor ca/phos this admission  4. Anemia of chronic kidney disease: hemoglobin 8.7 - epo with hemodialysis treatment   5. dependent edema - UF with HD as tolerated      LOS: 3 Carita Sollars 2/23/201711:23 AM

## 2015-08-17 NOTE — Progress Notes (Signed)
Subjective: Patient more responsive today.  Follows some commands.    Objective: Current vital signs: BP 119/44 mmHg  Pulse 67  Temp(Src) 100 F (37.8 C) (Axillary)  Resp 18  Ht  (1.753 m)  Wt 96.5 kg (212 lb 11.9 oz)  BMI 31.40 kg/m2  SpO2 100% Vital signs in last 24 hours: Temp:  [100 F (37.8 C)-100.9 F (38.3 C)] 100 F (37.8 C) (02/23 0900) Pulse Rate:  [59-75] 67 (02/23 1000) Resp:  [14-19] 18 (02/23 1000) BP: (107-125)/(41-51) 119/44 mmHg (02/23 1000) SpO2:  [99 %-100 %] 100 % (02/23 1000) FiO2 (%):  [30 %] 30 % (02/23 1000)  Intake/Output from previous day: 02/22 0701 - 02/23 0700 In: 1291.8 [I.V.:298.9; NG/GT:942.9; IV Piggyback:50] Out: -  Intake/Output this shift: Total I/O In: 100.6 [I.V.:20.6; NG/GT:80] Out: -  Nutritional status: Diet NPO time specified  Neurologic Exam: Mental Status: Eyes closed when I enter the room and does not open to his name being called.  With light sternal rub opens eyes and directs them to examiner.  Follows some simple commands. No verbalizations noted.  Cranial Nerves: II: patient does not respond confrontation bilaterally, pupils right 3 mm, left 3 mm,and reactive bilaterally III,IV,VI: Oculocephalic maneuver intact V,VII: corneal reflex present bilaterally. Frequent eye blinking noted.  VIII: patient does not respond to verbal stimuli IX,X: gag reflex reduced, XI: trapezius strength unable to test bilaterally XII: tongue strength unable to test Motor: Extremities flaccid throughout. Squeezes left hand to command.  Attempts to perform on the right but appears unable.  No movement noted in the lower extremities.     Lab Results: Basic Metabolic Panel:  Recent Labs Lab 08/14/15 1235 08/14/15 1236 08/15/15 0525 08/16/15 0508 08/17/15 0510  NA 137  --  137 138 138  K 5.1  --  5.5* 4.2 4.2  CL 96*  --  96* 97* 95*  CO2 24  --  GLUCOSE 142*  --  93 140* 132*  BUN 87*  --  102* 60* 85*   CREATININE 8.94* 8.81* 9.98* 6.78* 8.02*  CALCIUM 9.3  --  8.9 8.8* 9.1  MG  --  2.2  --   --   --   PHOS  --  7.3*  --   --   --     Liver Function Tests:  Recent Labs Lab 08/14/15 1236  AST 34  ALT 6*  ALKPHOS 98  BILITOT 1.2  PROT 7.0  ALBUMIN 3.0*   No results for input(s): LIPASE, AMYLASE in the last 168 hours. No results for input(s): AMMONIA in the last 168 hours.  CBC:  Recent Labs Lab 08/14/15 1235 08/15/15 0525 08/17/15 0510  WBC 25.0* 15.3* 17.0*  NEUTROABS 23.0*  --   --   HGB 8.7* 8.6* 8.7*  HCT 27.7* 26.9* 26.7*  MCV 91.9 92.6 89.0  PLT 346 328 308    Cardiac Enzymes:  Recent Labs Lab 08/14/15 1235 08/14/15 2310 08/15/15 0525  TROPONINI 0.28* 1.97* 1.60*    Lipid Panel: No results for input(s): CHOL, TRIG, HDL, CHOLHDL, VLDL, LDLCALC in the last 168 hours.  CBG:  Recent Labs Lab 08/16/15 1625 08/16/15 2022 08/16/15 2359 08/17/15 0412 08/17/15 0806  GLUCAP 119* 132* 131* 123* 116*    Microbiology: Results for orders placed or performed during the hospital encounter of 08/14/15  MRSA PCR Screening     Status: None   Collection Time: 08/14/15 12:35 PM  Result Value Ref Range Status  MRSA by PCR NEGATIVE NEGATIVE Final    Comment:        The GeneXpert MRSA Assay (FDA approved for NASAL specimens only), is one component of a comprehensive MRSA colonization surveillance program. It is not intended to diagnose MRSA infection nor to guide or monitor treatment for MRSA infections.   Culture, expectorated sputum-assessment     Status: None   Collection Time: 08/16/15 12:25 AM  Result Value Ref Range Status   Specimen Description SPUTUM  Final   Special Requests Normal  Final   Sputum evaluation THIS SPECIMEN IS ACCEPTABLE FOR SPUTUM CULTURE  Final   Report Status 08/16/2015 FINAL  Final  Culture, respiratory (NON-Expectorated)     Status: None (Preliminary result)   Collection Time: 08/16/15 12:25 AM  Result Value Ref Range  Status   Specimen Description SPUTUM  Final   Special Requests Normal Reflexed from [id number]  Final   Gram Stain PENDING  Incomplete   Culture   Final    LIGHT GROWTH GRAM NEGATIVE RODS IDENTIFICATION AND SUSCEPTIBILITIES TO FOLLOW    Report Status PENDING  Incomplete  Culture, blood (Routine X 2) w Reflex to ID Panel     Status: None (Preliminary result)   Collection Time: 08/16/15 10:51 AM  Result Value Ref Range Status   Specimen Description BLOOD RIGHT HAND  Final   Special Requests   Final    BOTTLES DRAWN AEROBIC AND ANAEROBIC ANA AER   Culture NO GROWTH < 24 HOURS  Final   Report Status PENDING  Incomplete  Culture, blood (Routine X 2) w Reflex to ID Panel     Status: None (Preliminary result)   Collection Time: 08/16/15 11:09 AM  Result Value Ref Range Status   Specimen Description BLOOD RIGHT HAND  Final   Special Requests BOTTLES DRAWN AEROBIC AND ANAEROBIC  Final   Culture NO GROWTH < 24 HOURS  Final   Report Status PENDING  Incomplete  Culture, expectorated sputum-assessment     Status: None   Collection Time: 08/16/15 12:15 PM  Result Value Ref Range Status   Specimen Description EXPECTORATED SPUTUM  Final   Special Requests NONE  Final   Sputum evaluation THIS SPECIMEN IS ACCEPTABLE FOR SPUTUM CULTURE  Final   Report Status 08/16/2015 FINAL  Final  Culture, respiratory (NON-Expectorated)     Status: None (Preliminary result)   Collection Time: 08/16/15 12:15 PM  Result Value Ref Range Status   Specimen Description EXPECTORATED SPUTUM  Final   Special Requests NONE Reflexed from [id number]  Final   Gram Stain PENDING  Incomplete   Culture   Final    HEAVY GROWTH GRAM NEGATIVE RODS IDENTIFICATION AND SUSCEPTIBILITIES TO FOLLOW    Report Status PENDING  Incomplete    Coagulation Studies: No results for input(s): LABPROT, INR in the last 72 hours.  Imaging: Dg Chest 1 View  08/17/2015  CLINICAL DATA:  Dyspnea EXAM: CHEST 1 VIEW COMPARISON:  08/16/2015  FINDINGS: Endotracheal tube in satisfactory position. NG tube in the stomach. Dual lead pacemaker unchanged Hypoventilation with bibasilar atelectasis similar to the prior study. Negative for heart failure or effusion. IMPRESSION: Endotracheal tube in good position. Hypoventilation with bibasilar atelectasis is unchanged. Electronically Signed   By: Marlan Palau M.D.   On: 08/17/2015 07:07   Dg Chest 1 View  08/16/2015  CLINICAL DATA:  Acute onset of dyspnea.  Initial encounter. EXAM: CHEST 1 VIEW COMPARISON:  Chest radiograph performed 08/14/2015 FINDINGS: The patient's endotracheal tube  is seen ending 6 cm above the carina. An enteric tube is noted extending below the diaphragm. The lungs are mildly hypoexpanded. Vascular crowding and vascular congestion are seen. Mildly increased interstitial markings could reflect minimal interstitial edema. No pleural effusion or pneumothorax is seen. The cardiomediastinal silhouette is mildly enlarged. A pacemaker is noted overlying the left chest wall, with leads ending overlying the right atrium and right ventricle. No acute osseous abnormalities are identified. IMPRESSION: 1. Endotracheal tube seen ending 6 cm above the carina. 2. Lungs mildly hypoexpanded. Vascular congestion and mild cardiomegaly. Mildly increased interstitial markings could reflect minimal interstitial edema. Electronically Signed   By: Roanna Raider M.D.   On: 08/16/2015 03:02   Dg Abd 1 View  08/15/2015  CLINICAL DATA:  Orogastric tube placement. EXAM: ABDOMEN - 1 VIEW COMPARISON:  None. FINDINGS: Tip and side port of the enteric tube are below the diaphragm in the stomach. There is a normal bowel gas pattern in the included upper abdomen. The heart appears enlarged. IMPRESSION: Tip and side port of the enteric tube below the diaphragm in the stomach. Electronically Signed   By: Rubye Oaks M.D.   On: 08/15/2015 22:02   Ct Head Wo Contrast  08/16/2015  CLINICAL DATA:  Responsible only  to painful stimuli with no other movements noted, suffered respiratory arrest during left lower extremity angioplasty on 08/14/2015 EXAM: CT HEAD WITHOUT CONTRAST TECHNIQUE: Contiguous axial images were obtained from the base of the skull through the vertex without intravenous contrast. COMPARISON:  08/14/2015 FINDINGS: Fluid in the bilateral mastoid air cells. Inflammatory change left maxillary sinus and to a lesser degree in the ethmoid air cells. These findings are stable. There is no evidence of intracranial hemorrhage or extra-axial fluid. There is diffuse atrophy and white matter disease with no evidence of vascular territory infarct. Stable prominence of the ventricles related to atrophy. IMPRESSION: Involutional changes and mastoid air cell opacification stable. No acute intracranial abnormalities. Electronically Signed   By: Esperanza Heir M.D.   On: 08/16/2015 10:05    Medications:  I have reviewed the patient's current medications. Scheduled: . antiseptic oral rinse  7 mL Mouth Rinse QID  . ceFEPime (MAXIPIME) IV  1 g Intravenous Q24H  . chlorhexidine gluconate  15 mL Mouth Rinse BID  . feeding supplement (PRO-STAT SUGAR FREE 64)  30 mL Per Tube QID  . free water  100 mL Per Tube 3 times per day  . heparin subcutaneous  5,000 Units Subcutaneous Q12H  . insulin aspart  0-15 Units Subcutaneous 6 times per day  . ipratropium-albuterol  3 mL Nebulization Q6H  . pantoprazole sodium  40 mg Per Tube Daily  . senna-docusate  1 tablet Oral BID  . sodium chloride flush  10-40 mL Intracatheter Q12H    Assessment/Plan: Patient improved today.  Head CT performed on yesterday shows no evidence of diffuse edema.  EEG shows triphasic waves and is generally slow but shows no evidence of seizures.    Recommendations: 1.  Will continue to follow with you.    LOS: 3 days   Thana Farr, MD Neurology [phone] 08/17/2015  11:01 AM

## 2015-08-17 NOTE — Progress Notes (Signed)
Chaplain rounded the unit and provided  compassionate care and support to the patient.  Said silent prayer. Jefm Petty [phone]

## 2015-08-17 NOTE — Progress Notes (Signed)
[facility] Medicine Progess Note    ASSESSMENT/PLAN    ASSESSMENT / PLAN:  34 M with ESRD, prior CVA, prior MI, ESRD on home HD underwent complex angioplasty to LLE. During procedure had problems with hypoxemia, then suffered PEA arrest requiring intubation and 2 mins ACLS.   PULMONARY A: VDRF post cardiac arrest Chest x-ray images reviewed, lungs unremarkable.  P:  Continue current vent settings, weaning trial today as tolerated.   NEURO: Anoxic encephalopathy.  Patient opens eyes, and looks at examiner. Mental status appears to be improved today -Continue to hold sedatives, supportive care.  CARDIOVASCULAR A:  PEA Cardiac arrest - etiology unclear. Continued hypotension, likely cardiogenic.We've if it has been weaned down to 6.6 mics.  Ischemic CM Severe PVD P:  MAP goal > 65 mmHg Wean down the Levophed as tolerated, will be conservative with fluids. Due to the patient's end-stage renal disease. -DVT prophylaxis: Heparin 5000 units subcutaneous every 12.  RENAL A:  ESRD on home HD P:  Monitor BMET intermittently Monitor I/Os Correct electrolytes as indicated Dialysis per nephrology.  GASTROINTESTINAL A:  No acute issues P:  -GI prophylaxis: IV PPI Continue tube feeds TFs  HEMATOLOGIC A:  Anemia of chronic disease/CKD P:  DVT px: SQ heparin Monitor CBC intermittently Transfuse per usual ICU guidelines  INFECTIOUS A:  Leukocytosis - likely stress demargination Febrile etiology uncertain. No evidence of acute bacterial infection. LLE ulcers appear clean P:  Monitor temp, WBC count Micro and abx as above  INDWELLING DEVICES:: ETT 02/20 >>  R femoral CVL 02/20 >>   Microbiology results: MRSA screen 2/20 negative. Sputum culture 2/22>> negative thus far  Antimicrobials: Cefepime 2/22 >>      ENDOCRINE A:  Initial Hyperglycemia, now appears well controlled. P:  We'll continue SSI q 4  hrs  NEUROLOGIC A:  Post anoxic encephalopathy. -EEG showed some slowing. P:  RASS goal: 0, -1 PAD protocol with intermittent fent, midaz   MAJOR EVENTS/TEST RESULTS: 02/20 Angioplasty to LLE c/b PEA arrest. Admitted to ICU after 2 mins ACLS. Intubated 02/20 LE venous US:  02/20 CT head: ---------------------   ----------------------------------------   Name: [name] MRN: [id number] DOB: [birth year]    ADMISSION DATE:  08/14/2015   SUBJECTIVE:   Pt currently on the ventilator, can not provide history or review of systems.      VITAL SIGNS: Temp:  [99.5 F (37.5 C)-100.2 F (37.9 C)] 100 F (37.8 C) (02/23 1725) Pulse Rate:  [58-73] 64 (02/23 1800) Resp:  [14-22] 19 (02/23 1800) BP: (91-134)/(39-62) 107/42 mmHg (02/23 1800) SpO2:  [97 %-100 %] 97 % (02/23 1800) FiO2 (%):  [30 %] 30 % (02/23 1530) HEMODYNAMICS: CVP:  [10 mmHg-18 mmHg] 12 mmHg VENTILATOR SETTINGS: Vent Mode:  [-] PRVC FiO2 (%):  [30 %] 30 % Set Rate:  [15 bmp] 15 bmp Vt Set:  [500 mL] 500 mL PEEP:  [5 cmH20] 5 cmH20 Plateau Pressure:  [19 cmH20] 19 cmH20 INTAKE / OUTPUT:  Intake/Output Summary (Last 24 hours) at 08/17/15 1952 Last data filed at 08/17/15 1800  Gross per 24 hour  Intake 1495.9 ml  Output   1000 ml  Net  495.9 ml    PHYSICAL EXAMINATION: Physical Examination:   VS: BP 107/42 mmHg  Pulse 64  Temp(Src) 100 F (37.8 C) (Axillary)  Resp 19  Ht 5\' 9"  (1.753 m)  Wt 212 lb 11.9 oz (96.5 kg)  BMI 31.40 kg/m2  SpO2 97%  General Appearance: No distress  Neuro:without focal findings, mental status Reduced, lethargic. Does not follow commands.  HEENT: PERRLA, EOM intact. Pulmonary: normal breath sounds   CardiovascularNormal S1,S2.  No m/r/g.   Abdomen: Benign, Soft, non-tender. Renal:  No costovertebral tenderness  GU:  Not performed at this time. Endocrine: No evident thyromegaly. Skin:   warm, no rashes, no ecchymosis  Extremities: normal, no cyanosis,  clubbing.   LABS:   LABORATORY PANEL:   CBC  Recent Labs Lab 08/17/15 0510  WBC 17.0*  HGB 8.7*  HCT 26.7*  PLT 308    Chemistries   Recent Labs Lab 08/14/15 1236  08/17/15 0510  NA  --   < > 138  K  --   < > 4.2  CL  --   < > 95*  CO2  --   < > 28  GLUCOSE  --   < > 132*  BUN  --   < > 85*  CREATININE 8.81*  < > 8.02*  CALCIUM  --   < > 9.1  MG 2.2  --   --   PHOS 7.3*  --   --   AST 34  --   --   ALT 6*  --   --   ALKPHOS 98  --   --   BILITOT 1.2  --   --   < > = values in this interval not displayed.   Recent Labs Lab 08/16/15 2022 08/16/15 2359 08/17/15 0412 08/17/15 0806 08/17/15 1136 08/17/15 1638  GLUCAP 132* 131* 123* 116* 147* 100*    Recent Labs Lab 08/15/15 0400 08/16/15 0445 08/17/15 0500  PHART 7.35 7.43 7.40  PCO2ART 43 43 47  PO2ART 128* 141* 114*    Recent Labs Lab 08/14/15 1236  AST 34  ALT 6*  ALKPHOS 98  BILITOT 1.2  ALBUMIN 3.0*    Cardiac Enzymes  Recent Labs Lab 08/15/15 0525  TROPONINI 1.60*    RADIOLOGY:  Dg Chest 1 View  08/17/2015  CLINICAL DATA:  Dyspnea EXAM: CHEST 1 VIEW COMPARISON:  08/16/2015 FINDINGS: Endotracheal tube in satisfactory position. NG tube in the stomach. Dual lead pacemaker unchanged Hypoventilation with bibasilar atelectasis similar to the prior study. Negative for heart failure or effusion. IMPRESSION: Endotracheal tube in good position. Hypoventilation with bibasilar atelectasis is unchanged. Electronically Signed   By: Marlan Palau M.D.   On: 08/17/2015 07:07   Dg Chest 1 View  08/16/2015  CLINICAL DATA:  Acute onset of dyspnea.  Initial encounter. EXAM: CHEST 1 VIEW COMPARISON:  Chest radiograph performed 08/14/2015 FINDINGS: The patient's endotracheal tube is seen ending 6 cm above the carina. An enteric tube is noted extending below the diaphragm. The lungs are mildly hypoexpanded. Vascular crowding and vascular congestion are seen. Mildly increased interstitial markings could  reflect minimal interstitial edema. No pleural effusion or pneumothorax is seen. The cardiomediastinal silhouette is mildly enlarged. A pacemaker is noted overlying the left chest wall, with leads ending overlying the right atrium and right ventricle. No acute osseous abnormalities are identified. IMPRESSION: 1. Endotracheal tube seen ending 6 cm above the carina. 2. Lungs mildly hypoexpanded. Vascular congestion and mild cardiomegaly. Mildly increased interstitial markings could reflect minimal interstitial edema. Electronically Signed   By: Roanna Raider M.D.   On: 08/16/2015 03:02   Dg Abd 1 View  08/15/2015  CLINICAL DATA:  Orogastric tube placement. EXAM: ABDOMEN - 1 VIEW COMPARISON:  None. FINDINGS: Tip and side port of the enteric tube are below the diaphragm in  the stomach. There is a normal bowel gas pattern in the included upper abdomen. The heart appears enlarged. IMPRESSION: Tip and side port of the enteric tube below the diaphragm in the stomach. Electronically Signed   By: Rubye Oaks M.D.   On: 08/15/2015 22:02   Ct Head Wo Contrast  08/16/2015  CLINICAL DATA:  Responsible only to painful stimuli with no other movements noted, suffered respiratory arrest during left lower extremity angioplasty on 08/14/2015 EXAM: CT HEAD WITHOUT CONTRAST TECHNIQUE: Contiguous axial images were obtained from the base of the skull through the vertex without intravenous contrast. COMPARISON:  08/14/2015 FINDINGS: Fluid in the bilateral mastoid air cells. Inflammatory change left maxillary sinus and to a lesser degree in the ethmoid air cells. These findings are stable. There is no evidence of intracranial hemorrhage or extra-axial fluid. There is diffuse atrophy and white matter disease with no evidence of vascular territory infarct. Stable prominence of the ventricles related to atrophy. IMPRESSION: Involutional changes and mastoid air cell opacification stable. No acute intracranial abnormalities.  Electronically Signed   By: Esperanza Heir M.D.   On: 08/16/2015 10:05       --Wells Guiles, MD.  Pager [phone] [facility] Office Number: [phone]  Santiago Glad, M.D.  Stephanie Acre, M.D.  [name], M.D  Critical Care Attestation.  I have personally obtained a history, examined the patient, evaluated laboratory and imaging results, formulated the assessment and plan and placed orders. The Patient requires high complexity decision making for assessment and support, frequent evaluation and titration of therapies, application of advanced monitoring technologies and extensive interpretation of multiple databases. The patient has critical illness that could lead imminently to failure of 1 or more organ systems and requires the highest level of physician preparedness to intervene.  Critical Care Time devoted to patient care services described in this note is 35 minutes and is exclusive of time spent in procedures.

## 2015-08-17 NOTE — Progress Notes (Signed)
Levophed decreased from to .

## 2015-08-17 NOTE — Progress Notes (Signed)
[facility]  Daily Progress Note   Subjective: 3 Days Post-Op: Left Lower Extremity Angiogram  Patient on vent. Sedated. Does not respond to commands. No acute distress.   Objective: Filed Vitals:   08/17/15 0630 08/17/15 0700 08/17/15 0800 08/17/15 0900  BP: 116/45 120/43 110/41 123/45  Pulse: 61 65 63 66  Temp:    100 F (37.8 C)  TempSrc:    Axillary  Resp: Height:      Weight:      SpO2: 99% 100% 100% 100%    Intake/Output Summary (Last 24 hours) at 08/17/15 0927 Last data filed at 08/17/15 0800  Gross per 24 hour  Intake 1392.41 ml  Output      0 ml  Net 1392.41 ml    Physical Exam: On Vent Sedated. Unable to communicate.  CV: RRR Pulmonary: Course, CTA Bilaterally Abdomen: Soft, Nontender, Nondistended Vascular: Left Groin: Central Line intact no signs of infection   Laboratory: CBC    Component Value Date/Time   WBC 17.0* 08/17/2015 0510   WBC 14.6* 01/11/2014 1412   HGB 8.7* 08/17/2015 0510   HGB 12.2* 01/11/2014 1412   HCT 26.7* 08/17/2015 0510   HCT 38.4* 01/11/2014 1412   PLT 308 08/17/2015 0510   PLT 203 01/11/2014 1412    BMET    Component Value Date/Time   NA 138 08/17/2015 0510   NA 140 01/11/2014 1412   K 4.2 08/17/2015 0510   K 5.4* 01/11/2014 1412   CL 95* 08/17/2015 0510   CL 101 01/11/2014 1412   CO2 28 08/17/2015 0510   CO2 28 01/11/2014 1412   GLUCOSE 132* 08/17/2015 0510   GLUCOSE 131* 01/11/2014 1412   BUN 85* 08/17/2015 0510   BUN 73* 01/11/2014 1412   CREATININE 8.02* 08/17/2015 0510   CREATININE 7.96* 01/11/2014 1412   CALCIUM 9.1 08/17/2015 0510   CALCIUM 8.2* 01/11/2014 1412   GFRNONAA 5* 08/17/2015 0510   GFRNONAA 6* 01/11/2014 1412   GFRAA 6* 08/17/2015 0510   GFRAA 7* 01/11/2014 1412   Assessment/Planning: 3 Days Post-Op: Left Lower Extremity Angiogram complicated by cardiac arrest during procedure. 1) Dialysis when nephrology service thinks appropriate 2) Continue  supportive care over next day or two to see what his neurologic level will be  Cleda Daub PA-C 08/17/2015 9:27 AM

## 2015-08-17 NOTE — Care Management Note (Signed)
Patient is active at  [facility] on a MWF schedule.  Clinic is aware of hospital admission and I will update them with additional records at discharge.  Ivor Reining  Dialysis Coordinator  [phone]

## 2015-08-17 NOTE — Progress Notes (Signed)
Pharmacy Antibiotic Note -Day 2  [name] is a 82 y.o. male admitted on 08/14/2015 with pneumonia.  Pharmacy has been consulted for cefepime dosing.  Plan: Cefepime 1g IV Q24hr.    Height:  (175.3 cm) Weight:  (Bedscale broken) IBW/kg (Calculated) : 70.7  Temp (24hrs), Avg:100 F (37.8 C), Min:99.5 F (37.5 C), Max:100.2 F (37.9 C)   Recent Labs Lab 08/14/15 1235 08/14/15 1236 08/15/15 0525 08/16/15 0508 08/17/15 0510  WBC 25.0*  --  15.3*  --  17.0*  CREATININE 8.94* 8.81* 9.98* 6.78* 8.02*    Estimated Creatinine Clearance: 7.9 mL/min (by C-G formula based on Cr of 8.02).    Allergies  Allergen Reactions  . Penicillins Swelling    Antimicrobials this admission: Cefepime 2/22 >>    Microbiology results: 2/22 BCx x 2: NGTD 2/22 Sputum: heavy growth gram negative rods   2/20 MRSA PCR: negative   Pharmacy will continue to monitor and adjust per consult.    Simpson,Michael L 08/17/2015 4:27 PM

## 2015-08-17 NOTE — Progress Notes (Signed)
Post hd tx 

## 2015-08-17 NOTE — Progress Notes (Signed)
Hemodialysis treatment start 

## 2015-08-17 NOTE — Progress Notes (Signed)
Hemodialysis completed. 

## 2015-08-17 NOTE — Procedures (Signed)
ELECTROENCEPHALOGRAM REPORT   Patient: [name]       Room #: IC08A-AA EEG No. ID: [id number] Age: 82 y.o.        Sex: male Referring Physician: Renae Gloss Report Date:  08/17/2015        Interpreting Physician: Thana Farr  History: [name] is an 82 y.o. male unresponsive s/p arrest  Medications:  Scheduled: . antiseptic oral rinse  7 mL Mouth Rinse QID  . ceFEPime (MAXIPIME) IV  1 g Intravenous Q24H  . chlorhexidine gluconate  15 mL Mouth Rinse BID  . feeding supplement (PRO-STAT SUGAR FREE 64)  30 mL Per Tube QID  . free water  100 mL Per Tube 3 times per day  . heparin subcutaneous  5,000 Units Subcutaneous Q12H  . insulin aspart  0-15 Units Subcutaneous 6 times per day  . ipratropium-albuterol  3 mL Nebulization Q6H  . pantoprazole sodium  40 mg Per Tube Daily  . senna-docusate  1 tablet Oral BID  . sodium chloride flush  10-40 mL Intracatheter Q12H    Conditions of Recording:  This is a 16 channel EEG carried out with the patient in the unresponsive but unsedated state.  Description:  The background activity consists of a mixture of low voltage mixture of theta and delta frequencies.  For the majority of the recording it is poorly organized although there are some periods of very rhythmic activity.  This activity is not continuous.  There are frequent periods of attenuation that are generalized and short-lived lasting up to one second.  Also noted frequently during the recording are intermittent periodic discharges of triphasic morphology. There is no change in the recording with opening and closing of eyes or stimulation.   IMPRESSION: This is an abnormal electroencephalogram secondary to general background slowing and the presence of frequent triphasic waves.  Triphasic waves are seen most commonly in encephalopathic states.  Although most often seen with hepatic encephalopathies, it is not isolated to this clinical scenario.  There is no evidence of reactivity.     Thana Farr, MD Neurology [phone] 08/17/2015, 8:28 AM

## 2015-08-18 ENCOUNTER — Inpatient Hospital Stay: Payer: Medicare Other

## 2015-08-18 DIAGNOSIS — L8990 Pressure ulcer of unspecified site, unspecified stage: Secondary | ICD-10-CM | POA: Insufficient documentation

## 2015-08-18 LAB — BASIC METABOLIC PANEL
Anion gap: 12 (ref 5–15)
BUN: 61 mg/dL — ABNORMAL HIGH (ref 6–20)
CALCIUM: 9.2 mg/dL (ref 8.9–10.3)
CO2: 32 mmol/L (ref 22–32)
CREATININE: 5.61 mg/dL — AB (ref 0.61–1.24)
Chloride: 96 mmol/L — ABNORMAL LOW (ref 101–111)
GFR, EST AFRICAN AMERICAN: 10 mL/min — AB (ref >60)
GFR, EST NON AFRICAN AMERICAN: 8 mL/min — AB (ref >60)
Glucose, Bld: 117 mg/dL — ABNORMAL HIGH (ref 65–99)
Potassium: 3.8 mmol/L (ref 3.5–5.1)
SODIUM: 140 mmol/L (ref 135–145)

## 2015-08-18 LAB — CBC
HCT: 26.4 % — ABNORMAL LOW (ref 40.0–52.0)
Hemoglobin: 8.4 g/dL — ABNORMAL LOW (ref 13.0–18.0)
MCH: 29 pg (ref 26.0–34.0)
MCHC: 31.7 g/dL — AB (ref 32.0–36.0)
MCV: 91.5 fL (ref 80.0–100.0)
PLATELETS: 301 10*3/uL (ref 150–440)
RBC: 2.89 MIL/uL — ABNORMAL LOW (ref 4.40–5.90)
RDW: 16.9 % — AB (ref 11.5–14.5)
WBC: 13.3 10*3/uL — ABNORMAL HIGH (ref 3.8–10.6)

## 2015-08-18 LAB — BLOOD GAS, ARTERIAL
ACID-BASE EXCESS: 8.6 mmol/L — AB (ref 0.0–3.0)
Bicarbonate: 33.4 mEq/L — ABNORMAL HIGH (ref 21.0–28.0)
FIO2: 0.3
MECHVT: 500 mL
Mechanical Rate: 15
O2 Saturation: 97.6 %
PEEP/CPAP: 5 cmH2O
PH ART: 7.46 — AB (ref 7.350–7.450)
Patient temperature: 37
pCO2 arterial: 47 mmHg (ref 32.0–48.0)
pO2, Arterial: 92 mmHg (ref 83.0–108.0)

## 2015-08-18 LAB — GLUCOSE, CAPILLARY
GLUCOSE-CAPILLARY: 119 mg/dL — AB (ref 65–99)
GLUCOSE-CAPILLARY: 126 mg/dL — AB (ref 65–99)
GLUCOSE-CAPILLARY: 133 mg/dL — AB (ref 65–99)
GLUCOSE-CAPILLARY: 138 mg/dL — AB (ref 65–99)
Glucose-Capillary: 112 mg/dL — ABNORMAL HIGH (ref 65–99)
Glucose-Capillary: 110 mg/dL — ABNORMAL HIGH (ref 65–99)
Glucose-Capillary: 118 mg/dL — ABNORMAL HIGH (ref 65–99)
Glucose-Capillary: 98 mg/dL (ref 65–99)

## 2015-08-18 NOTE — Care Management (Signed)
Off sedation.  Tolerating SBT.

## 2015-08-18 NOTE — Progress Notes (Signed)
Pt following simple commands and tracking RN in room, pt tol vent

## 2015-08-18 NOTE — Progress Notes (Signed)
[facility] [place] [facility]   PATIENT NAME: [name]    MR#:  [id number]  DATE OF BIRTH:  01-29-1929  SUBJECTIVE:  Unable to obtain patient intubated sedated  REVIEW OF SYSTEMS:  Unable to obtain patient's intubated sedated  DRUG ALLERGIES:   Allergies  Allergen Reactions  . Penicillins Swelling    VITALS:  Blood pressure 113/48, pulse 63, temperature 99.2 F (37.3 C), temperature source Axillary, resp. rate 20, height  (1.753 m), weight 96.5 kg (212 lb 11.9 oz), SpO2 100 %.  PHYSICAL EXAMINATION:   VITAL SIGNS: Filed Vitals:   08/18/15 1000 08/18/15 1100  BP: 103/58 113/48  Pulse: 62 63  Temp:    Resp: 24 20   GENERAL:82 y.o.male critically ill and intubated sedated HEAD: Normocephalic, atraumatic.  EYES: Pupils equal, round, reactive to light. Unable to assess extraocular muscles given mental status/medical condition. No scleral icterus.  MOUTH: Moist mucosal membrane. Dentition intact. No abscess noted.  EAR, NOSE, THROAT: Clear without exudates. No external lesions.  NECK: Supple. No thyromegaly. No nodules. No JVD.  PULMONARY: Clear to ascultation, without wheeze rails or rhonci. No use of accessory muscles, Good respiratory effort. good air entry bilaterally CHEST: Nontender to palpation.  CARDIOVASCULAR: S1 and S2. Regular rate and rhythm. No murmurs, rubs, or gallops. No edema. Pedal pulses 2+ bilaterally.  GASTROINTESTINAL: Soft, nontender, nondistended. No masses. Positive bowel sounds. No hepatosplenomegaly.  MUSCULOSKELETAL: No swelling, clubbing, or edema. Range of motion full in all extremities.  NEUROLOGIC: Unable to assess given mental status/medical condition SKIN: No ulceration, lesions, rashes, or cyanosis. Skin warm and dry. Turgor intact.  PSYCHIATRIC: Unable to assess given mental status/medical condition       LABORATORY PANEL:   CBC  Recent Labs Lab 08/18/15 0509  WBC 13.3*  HGB 8.4*  HCT  26.4*  PLT 301   ------------------------------------------------------------------------------------------------------------------  Chemistries   Recent Labs Lab 08/14/15 1236  08/18/15 0509  NA  --   < > 140  K  --   < > 3.8  CL  --   < > 96*  CO2  --   < > 32  GLUCOSE  --   < > 117*  BUN  --   < > 61*  CREATININE 8.81*  < > 5.61*  CALCIUM  --   < > 9.2  MG 2.2  --   --   AST 34  --   --   ALT 6*  --   --   ALKPHOS 98  --   --   BILITOT 1.2  --   --   < > = values in this interval not displayed. ------------------------------------------------------------------------------------------------------------------  Cardiac Enzymes  Recent Labs Lab 08/15/15 0525  TROPONINI 1.60*   ------------------------------------------------------------------------------------------------------------------  RADIOLOGY:  Dg Chest 1 View  08/18/2015  CLINICAL DATA:  Dyspnea, cardiac arrest and respiratory arrest, former smoker. , intubated patient. EXAM: CHEST 1 VIEW COMPARISON:  Portable chest x-ray of August 17, 2015 FINDINGS: The lungs remain mildly hypoinflated. The interstitial markings remain mildly prominent. The cardiac silhouette remains enlarged. The pulmonary vascularity is less engorged. The endotracheal tube tip lies 3 cm above the carina. The permanent pacemaker electrodes are in stable position. An esophagogastric tube tip projects below the inferior margin of the image. IMPRESSION: Slight interval improvement in the appearance of the pulmonary interstitium and pulmonary vascularity consistent with improving CHF. Stable cardiomegaly. Persistent mild hypo inflation. The endotracheal tube and other support devices are in  reasonable position. Electronically Signed   By: David  [place] M.D.   On: 08/18/2015 07:10   Dg Chest 1 View  08/17/2015  CLINICAL DATA:  Dyspnea EXAM: CHEST 1 VIEW COMPARISON:  08/16/2015 FINDINGS: Endotracheal tube in satisfactory position. NG tube in the  stomach. Dual lead pacemaker unchanged Hypoventilation with bibasilar atelectasis similar to the prior study. Negative for heart failure or effusion. IMPRESSION: Endotracheal tube in good position. Hypoventilation with bibasilar atelectasis is unchanged. Electronically Signed   By: Marlan Palau M.D.   On: 08/17/2015 07:07    EKG:   Orders placed or performed during the hospital encounter of 08/14/15  . EKG 12-Lead  . EKG 12-Lead  . EKG 12-Lead  . EKG 12-Lead    ASSESSMENT AND PLAN:   82 year old gentleman admitted 2/20/17post arrest doing a vascular procedure     1. Acute cardiac arrest. PEA arrest. Overall prognosis poor. Supportive care at this point. 2. Cardiogenic shock on levophed 3. Likely anoxic encephalopathy. Repeat CT scan is negative. EEG showed generalized slowing and no seizure activity. Neurology consultation appreciated. Overall prognosis and quality of life will depend on his mental status. Right now no purposeful movement. 4. End-stage renal disease on hemodialysis. Nephrology to continue with dialysis. 5. Type 2 diabetes- sliding-scale insulin for now 6. History of chronic systolic heart failure. 7. Peripheral vascular disease with ulcer left medial malleolus. Local wound care. 8. Anemia of chronic disease on Epogen with dialysis 9. Nutrition tube feeding to be started 10. Patient placed empirically on antibiotics. 11. Long discussion with the patient's son and wife in the waiting room. The patient was made a DO NOT RESUSCITATE. They stated that he would not want to live on a ventilator and in this type of state. They likely will not proceed with a tracheostomy. They will consider terminal extubation if he does not improve.    All the records are reviewed and case discussed with Care Management/Social Workerr. Management plans discussed with the patient, family and they are in agreement.  CODE STATUS: dnr  TOTAL TIME TAKING CARE OF THIS PATIENT: 28 minutes.    POSSIBLE D/C IN 2-3 DAYS, DEPENDING ON CLINICAL CONDITION.   Hower,  Mardi Mainland.D on 08/18/2015 at 2:14 PM  Between 7am to 6pm - Pager - [phone]  After 6pm: House Pager: - [phone]  [facility]  Office  [phone]  CC: Primary care physician; Lauro Regulus., MD

## 2015-08-18 NOTE — Progress Notes (Signed)
Subjective:  Patient is critically ill Remains vent dependent Tolerated dialysis well yesterday.  1000 cc was removed   Objective:  Vital signs in last 24 hours:  Temp:  [99.2 F (37.3 C)-100.3 F (37.9 C)] 99.2 F (37.3 C) (02/24 0801) Pulse Rate:  [59-68] 63 (02/24 1100) Resp:  [0-24] 20 (02/24 1100) BP: (95-134)/(42-62) 113/48 mmHg (02/24 1100) SpO2:  [97 %-100 %] 100 % (02/24 1100) FiO2 (%):  [30 %] 30 % (02/24 1445)  Weight change:  Filed Weights   08/15/15 1615 08/15/15 1945  Weight: 98.2 kg (216 lb 7.9 oz) 96.5 kg (212 lb 11.9 oz)    Intake/Output:    Intake/Output Summary (Last 24 hours) at 08/18/15 1454 Last data filed at 08/18/15 0600  Gross per 24 hour  Intake 1270.7 ml  Output   1000 ml  Net  270.7 ml     Physical Exam: General: Critically ill appearing  HEENT ETT, OGT,    Neck No mass  Pulm/lungs Vent assisted  CVS/Heart Paced rhythm, irregular  Abdomen:  Soft, NT, ND  Extremities: + dependent  edema  Neurologic: Open eyes, not following commands  Skin: eccymosis  Access: Left arm AVF       Basic Metabolic Panel:   Recent Labs Lab 08/14/15 1235 08/14/15 1236 08/15/15 0525 08/16/15 0508 08/17/15 0510 08/18/15 0509  NA 137  --  137 138 138 140  K 5.1  --  5.5* 4.2 4.2 3.8  CL 96*  --  96* 97* 95* 96*  CO2 24  --  32  GLUCOSE 142*  --  93 140* 132* 117*  BUN 87*  --  102* 60* 85* 61*  CREATININE 8.94* 8.81* 9.98* 6.78* 8.02* 5.61*  CALCIUM 9.3  --  8.9 8.8* 9.1 9.2  MG  --  2.2  --   --   --   --   PHOS  --  7.3*  --   --   --   --      CBC:  Recent Labs Lab 08/14/15 1235 08/15/15 0525 08/17/15 0510 08/18/15 0509  WBC 25.0* 15.3* 17.0* 13.3*  NEUTROABS 23.0*  --   --   --   HGB 8.7* 8.6* 8.7* 8.4*  HCT 27.7* 26.9* 26.7* 26.4*  MCV 91.9 92.6 89.0 91.5  PLT 346 328 308 301      Microbiology:  Recent Results (from the past 720 hour(s))  MRSA PCR Screening     Status: None   Collection Time: 08/14/15  12:35 PM  Result Value Ref Range Status   MRSA by PCR NEGATIVE NEGATIVE Final    Comment:        The GeneXpert MRSA Assay (FDA approved for NASAL specimens only), is one component of a comprehensive MRSA colonization surveillance program. It is not intended to diagnose MRSA infection nor to guide or monitor treatment for MRSA infections.   Culture, expectorated sputum-assessment     Status: None   Collection Time: 08/16/15 12:25 AM  Result Value Ref Range Status   Specimen Description SPUTUM  Final   Special Requests Normal  Final   Sputum evaluation THIS SPECIMEN IS ACCEPTABLE FOR SPUTUM CULTURE  Final   Report Status 08/16/2015 FINAL  Final  Culture, respiratory (NON-Expectorated)     Status: None (Preliminary result)   Collection Time: 08/16/15 12:25 AM  Result Value Ref Range Status   Specimen Description SPUTUM  Final   Special Requests Normal Reflexed from [id number]  Final  Gram Stain   Final    FEW WBC SEEN NO SQUAMOUS EPITHELIAL CELLS PRESENT FEW GRAM POSITIVE RODS RARE GRAM NEGATIVE RODS EXCELLENT SPECIMEN - 90-100% WBCS    Culture   Final    LIGHT GROWTH GRAM NEGATIVE RODS IDENTIFICATION AND SUSCEPTIBILITIES TO FOLLOW    Report Status PENDING  Incomplete  Culture, blood (Routine X 2) w Reflex to ID Panel     Status: None (Preliminary result)   Collection Time: 08/16/15 10:51 AM  Result Value Ref Range Status   Specimen Description BLOOD RIGHT HAND  Final   Special Requests   Final    BOTTLES DRAWN AEROBIC AND ANAEROBIC ANA AER   Culture NO GROWTH 2 DAYS  Final   Report Status PENDING  Incomplete  Culture, blood (Routine X 2) w Reflex to ID Panel     Status: None (Preliminary result)   Collection Time: 08/16/15 11:09 AM  Result Value Ref Range Status   Specimen Description BLOOD RIGHT HAND  Final   Special Requests BOTTLES DRAWN AEROBIC AND ANAEROBIC  Final   Culture NO GROWTH 2 DAYS  Final   Report Status PENDING  Incomplete  Culture,  expectorated sputum-assessment     Status: None   Collection Time: 08/16/15 12:15 PM  Result Value Ref Range Status   Specimen Description EXPECTORATED SPUTUM  Final   Special Requests NONE  Final   Sputum evaluation THIS SPECIMEN IS ACCEPTABLE FOR SPUTUM CULTURE  Final   Report Status 08/16/2015 FINAL  Final  Culture, respiratory (NON-Expectorated)     Status: None (Preliminary result)   Collection Time: 08/16/15 12:15 PM  Result Value Ref Range Status   Specimen Description EXPECTORATED SPUTUM  Final   Special Requests NONE Reflexed from [id number]  Final   Gram Stain   Final    FEW WBC SEEN FEW SQUAMOUS EPITHELIAL CELLS PRESENT MODERATE GRAM POSITIVE RODS FEW GRAM NEGATIVE RODS FEW GRAM POSITIVE COCCI GOOD SPECIMEN - 80-90% WBCS    Culture   Final    HEAVY GROWTH KLEBSIELLA PNEUMONIAE HOLDING FOR POSSIBLE PATHOGEN    Report Status PENDING  Incomplete   Organism ID, Bacteria KLEBSIELLA PNEUMONIAE  Final      Susceptibility   Klebsiella pneumoniae - MIC*    AMPICILLIN >=32 RESISTANT Resistant     CEFAZOLIN <=4 SENSITIVE Sensitive     CEFEPIME <=1 SENSITIVE Sensitive     CEFTAZIDIME <=1 SENSITIVE Sensitive     CEFTRIAXONE <=1 SENSITIVE Sensitive     CIPROFLOXACIN <=0.25 SENSITIVE Sensitive     GENTAMICIN <=1 SENSITIVE Sensitive     IMIPENEM <=0.25 SENSITIVE Sensitive     TRIMETH/SULFA <=20 SENSITIVE Sensitive     AMPICILLIN/SULBACTAM 4 SENSITIVE Sensitive     PIP/TAZO <=4 SENSITIVE Sensitive     Extended ESBL NEGATIVE Sensitive     * HEAVY GROWTH KLEBSIELLA PNEUMONIAE    Coagulation Studies: No results for input(s): LABPROT, INR in the last 72 hours.  Urinalysis: No results for input(s): COLORURINE, LABSPEC, PHURINE, GLUCOSEU, HGBUR, BILIRUBINUR, KETONESUR, PROTEINUR, UROBILINOGEN, NITRITE, LEUKOCYTESUR in the last 72 hours.  Invalid input(s): APPERANCEUR    Imaging: Dg Chest 1 View  08/18/2015  CLINICAL DATA:  Dyspnea, cardiac arrest and respiratory arrest, former  smoker. , intubated patient. EXAM: CHEST 1 VIEW COMPARISON:  Portable chest x-ray of August 17, 2015 FINDINGS: The lungs remain mildly hypoinflated. The interstitial markings remain mildly prominent. The cardiac silhouette remains enlarged. The pulmonary vascularity is less engorged. The endotracheal tube tip lies  3 cm above the carina. The permanent pacemaker electrodes are in stable position. An esophagogastric tube tip projects below the inferior margin of the image. IMPRESSION: Slight interval improvement in the appearance of the pulmonary interstitium and pulmonary vascularity consistent with improving CHF. Stable cardiomegaly. Persistent mild hypo inflation. The endotracheal tube and other support devices are in reasonable position. Electronically Signed   By: David  [place] M.D.   On: 08/18/2015 07:10   Dg Chest 1 View  08/17/2015  CLINICAL DATA:  Dyspnea EXAM: CHEST 1 VIEW COMPARISON:  08/16/2015 FINDINGS: Endotracheal tube in satisfactory position. NG tube in the stomach. Dual lead pacemaker unchanged Hypoventilation with bibasilar atelectasis similar to the prior study. Negative for heart failure or effusion. IMPRESSION: Endotracheal tube in good position. Hypoventilation with bibasilar atelectasis is unchanged. Electronically Signed   By: Marlan Palau M.D.   On: 08/17/2015 07:07     Medications:   . feeding supplement (VITAL HIGH PROTEIN) 1,000 mL (08/18/15 0600)  . norepinephrine (LEVOPHED) Adult infusion 7.04 mcg/min (08/18/15 0600)   . antiseptic oral rinse  7 mL Mouth Rinse QID  . ceFEPime (MAXIPIME) IV  1 g Intravenous Q24H  . chlorhexidine gluconate  15 mL Mouth Rinse BID  . feeding supplement (PRO-STAT SUGAR FREE 64)  30 mL Per Tube QID  . free water  100 mL Per Tube 3 times per day  . heparin subcutaneous  5,000 Units Subcutaneous Q12H  . insulin aspart  0-15 Units Subcutaneous 6 times per day  . ipratropium-albuterol  3 mL Nebulization Q6H  . pantoprazole sodium  40 mg Per  Tube Daily  . senna-docusate  1 tablet Oral BID  . sodium chloride flush  10-40 mL Intracatheter Q12H   acetaminophen, albuterol, fentaNYL (SUBLIMAZE) injection, fentaNYL (SUBLIMAZE) injection, midazolam, midazolam, sodium chloride flush  Assessment/ Plan:  82 y.o. male Mr. KIERON KANTNER is a 81 y.o. white male with Parkinson's, coronary artery disease, hyperlipidemia, hypertension, GERD,. 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 80% stenosis of the tibioperoneal trunk with peroneal runoff distally which was diffusely diseased. Occlusion of the anterior tibial artery with reconstitution of the anterior tibial artery in the lower leg, hypoxic PEA arrest 08/14/15, Left ankle fracture: ORIF for left ankle. 12/7 Dr. Joice Lofts,   [facility] [place]  1. End Stage Renal Disease   - HD tomorrow  2.  Acute resp failure S/P hypoxic PEA arrest - vent assisted  3. Secondary Hyperparathyroidism  - monitor ca/phos this admission  4. Anemia of chronic kidney disease: hemoglobin 8.4 - epo with hemodialysis treatment   5. dependent edema - UF with HD as tolerated      LOS: 4 Ace Bergfeld 2/24/20172:54 PM

## 2015-08-18 NOTE — Progress Notes (Signed)
Nutrition Follow-up     INTERVENTION:   EN: recommend continuing current TF regimen, continue to assess   NUTRITION DIAGNOSIS:   Inadequate oral intake related to acute illness as evidenced by NPO status. Being addressed via TF  GOAL:   Provide needs based on ASPEN/SCCM guidelines  MONITOR:    (Energy Intake, Anthropometrics, Digestive System, Electrolyte/Renal Profile, Glucose Profile, Pulmonary)  REASON FOR ASSESSMENT:   Ventilator, Consult Enteral/tube feeding initiation and management  ASSESSMENT:   Pt remains on vent  Diet Order:  Diet NPO time specified   EN: tolerating Vital High Protein at rate of 40 ml/hr, Prostat QID, 100 mL free water TID  Glucose Profile:  Recent Labs  08/18/15 0442 08/18/15 0747 08/18/15 1209  GLUCAP 110* 138* 119*    Recent Labs Lab 08/14/15 1236  08/16/15 0508 08/17/15 0510 08/18/15 0509  NA  --   < > 138 138 140  K  --   < > 4.2 4.2 3.8  CL  --   < > 97* 95* 96*  CO2  --   < > 28 28 32  BUN  --   < > 60* 85* 61*  CREATININE 8.81*  < > 6.78* 8.02* 5.61*  CALCIUM  --   < > 8.8* 9.1 9.2  MG 2.2  --   --   --   --   PHOS 7.3*  --   --   --   --   GLUCOSE  --   < > 140* 132* 117*  < > = values in this interval not displayed.  Meds: ss novolog, levophed  Height:   Ht Readings from Last 1 Encounters:  08/14/15  (1.753 m)    Weight:   Wt Readings from Last 1 Encounters:  07/27/15 208 lb (94.348 kg)    Filed Weights   08/15/15 1615 08/15/15 1945  Weight: 216 lb 7.9 oz (98.2 kg) 212 lb 11.9 oz (96.5 kg)    BMI:  Body mass index is 31.4 kg/(m^2).  Estimated Nutritional Needs:   Kcal:  1080-1375 kcals (11-14 kcals/kg) using wt of 98.2 kg  Protein:  146 g/kg (2.0 g/kg) using IBW 72.7 kg  Fluid:  1825-2190 mL (25-30 ml/kg)   HIGH Care Level  Romelle Starcher MS, RD, LDN [phone] Pager  [phone] Weekend/On-Call Pager

## 2015-08-18 NOTE — Progress Notes (Signed)
Pt coughing, OGT came out while pt coughing, pt deep suctioned, orally suctioned, secretions consistent with previous assessment

## 2015-08-18 NOTE — Progress Notes (Signed)
[facility]  Daily Progress Note   Subjective: 4 Days Post-Op: Left Lower Extremity Angiogram complicated by PEA arrest during procedure.  Patient on vent. Sedated. Does not respond to commands. Some eye movement. No acute distress.   Objective: Filed Vitals:   08/18/15 0841 08/18/15 0900 08/18/15 1000 08/18/15 1100  BP:  113/51 103/58 113/48  Pulse:  64 62 63  Temp:      TempSrc:      Resp:  Height:      Weight:      SpO2: 98% 100% 98% 100%    Intake/Output Summary (Last 24 hours) at 08/18/15 1148 Last data filed at 08/18/15 0600  Gross per 24 hour  Intake 1370.7 ml  Output   1000 ml  Net  370.7 ml    Physical Exam: On Vent Sedated. Unable to communicate.  CV: RRR Pulmonary: Course, CTA Bilaterally Abdomen: Soft, Nontender, Nondistended Vascular: Left Groin: Central Line intact no signs of infection   Laboratory: CBC    Component Value Date/Time   WBC 13.3* 08/18/2015 0509   WBC 14.6* 01/11/2014 1412   HGB 8.4* 08/18/2015 0509   HGB 12.2* 01/11/2014 1412   HCT 26.4* 08/18/2015 0509   HCT 38.4* 01/11/2014 1412   PLT 301 08/18/2015 0509   PLT 203 01/11/2014 1412    BMET    Component Value Date/Time   NA 140 08/18/2015 0509   NA 140 01/11/2014 1412   K 3.8 08/18/2015 0509   K 5.4* 01/11/2014 1412   CL 96* 08/18/2015 0509   CL 101 01/11/2014 1412   CO2 32 08/18/2015 0509   CO2 28 01/11/2014 1412   GLUCOSE 117* 08/18/2015 0509   GLUCOSE 131* 01/11/2014 1412   BUN 61* 08/18/2015 0509   BUN 73* 01/11/2014 1412   CREATININE 5.61* 08/18/2015 0509   CREATININE 7.96* 01/11/2014 1412   CALCIUM 9.2 08/18/2015 0509   CALCIUM 8.2* 01/11/2014 1412   GFRNONAA 8* 08/18/2015 0509   GFRNONAA 6* 01/11/2014 1412   GFRAA 10* 08/18/2015 0509   GFRAA 7* 01/11/2014 1412    Assessment/Planning: 4 Days Post-Op: Left Lower Extremity Angiogram complicated by cardiac arrest during procedure. 1) Care as per critical care  team.  Cleda Daub PA-C 08/18/2015 11:48 AM

## 2015-08-18 NOTE — Progress Notes (Signed)
Pharmacy Antibiotic Note -Day 2  [name] is a 82 y.o. male admitted on 08/14/2015 with pneumonia.  Pharmacy has been consulted for cefepime dosing.  Plan: Continue cefepime 1g IV Q24hr.    Height:  (175.3 cm) Weight:  (scale not weighing) IBW/kg (Calculated) : 70.7  Temp (24hrs), Avg:100 F (37.8 C), Min:99.2 F (37.3 C), Max:100.3 F (37.9 C)   Recent Labs Lab 08/14/15 1235 08/14/15 1236 08/15/15 0525 08/16/15 0508 08/17/15 0510 08/18/15 0509  WBC 25.0*  --  15.3*  --  17.0* 13.3*  CREATININE 8.94* 8.81* 9.98* 6.78* 8.02* 5.61*    Estimated Creatinine Clearance: 11.2 mL/min (by C-G formula based on Cr of 5.61).    Allergies  Allergen Reactions  . Penicillins Swelling    Antimicrobials this admission: Cefepime 2/22 >>    Microbiology results: 2/22 BCx x 2: NGTD 2/22 Sputum: heavy growth gram negative rods   2/20 MRSA PCR: negative   Pharmacy will continue to monitor and adjust per consult.    Luisa Hart D 08/18/2015 12:06 PM

## 2015-08-19 ENCOUNTER — Inpatient Hospital Stay: Payer: Medicare Other

## 2015-08-19 DIAGNOSIS — G9340 Encephalopathy, unspecified: Secondary | ICD-10-CM

## 2015-08-19 DIAGNOSIS — Z992 Dependence on renal dialysis: Secondary | ICD-10-CM

## 2015-08-19 LAB — CBC
HEMATOCRIT: 25.3 % — AB (ref 40.0–52.0)
Hemoglobin: 8 g/dL — ABNORMAL LOW (ref 13.0–18.0)
MCH: 28.6 pg (ref 26.0–34.0)
MCHC: 31.8 g/dL — AB (ref 32.0–36.0)
MCV: 89.9 fL (ref 80.0–100.0)
PLATELETS: 307 10*3/uL (ref 150–440)
RBC: 2.82 MIL/uL — ABNORMAL LOW (ref 4.40–5.90)
RDW: 17 % — AB (ref 11.5–14.5)
WBC: 14.1 10*3/uL — ABNORMAL HIGH (ref 3.8–10.6)

## 2015-08-19 LAB — CULTURE, RESPIRATORY

## 2015-08-19 LAB — BASIC METABOLIC PANEL
ANION GAP: 12 (ref 5–15)
BUN: 84 mg/dL — AB (ref 6–20)
CALCIUM: 9.2 mg/dL (ref 8.9–10.3)
CO2: 30 mmol/L (ref 22–32)
CREATININE: 6.96 mg/dL — AB (ref 0.61–1.24)
Chloride: 92 mmol/L — ABNORMAL LOW (ref 101–111)
GFR calc Af Amer: 7 mL/min — ABNORMAL LOW (ref >60)
GFR calc non Af Amer: 6 mL/min — ABNORMAL LOW (ref >60)
GLUCOSE: 126 mg/dL — AB (ref 65–99)
Potassium: 3.9 mmol/L (ref 3.5–5.1)
Sodium: 134 mmol/L — ABNORMAL LOW (ref 135–145)

## 2015-08-19 LAB — CULTURE, RESPIRATORY W GRAM STAIN: Special Requests: NORMAL

## 2015-08-19 LAB — GLUCOSE, CAPILLARY
GLUCOSE-CAPILLARY: 114 mg/dL — AB (ref 65–99)
GLUCOSE-CAPILLARY: 81 mg/dL (ref 65–99)
Glucose-Capillary: 116 mg/dL — ABNORMAL HIGH (ref 65–99)
Glucose-Capillary: 101 mg/dL — ABNORMAL HIGH (ref 65–99)

## 2015-08-19 MED ORDER — ALTEPLASE 2 MG IJ SOLR
2.0000 mg | Freq: Once | INTRAMUSCULAR | Status: AC
  Administered 2015-08-19: 2 mg
  Filled 2015-08-19: qty 2

## 2015-08-19 MED ORDER — CETYLPYRIDINIUM CHLORIDE 0.05 % MT LIQD
7.0000 mL | Freq: Two times a day (BID) | OROMUCOSAL | Status: DC
  Administered 2015-08-19: 7 mL via OROMUCOSAL

## 2015-08-19 MED ORDER — MORPHINE SULFATE (PF) 2 MG/ML IV SOLN
1.0000 mg | INTRAVENOUS | Status: DC | PRN
  Administered 2015-08-20: 2 mg via INTRAVENOUS
  Administered 2015-08-21: 1 mg via INTRAVENOUS
  Administered 2015-08-21: 2 mg via INTRAVENOUS
  Filled 2015-08-19 (×3): qty 1

## 2015-08-19 MED ORDER — PANTOPRAZOLE SODIUM 40 MG IV SOLR
40.0000 mg | INTRAVENOUS | Status: DC
  Administered 2015-08-19: 40 mg via INTRAVENOUS
  Filled 2015-08-19: qty 40

## 2015-08-19 MED ORDER — STERILE WATER FOR INJECTION IJ SOLN
INTRAMUSCULAR | Status: AC
  Administered 2015-08-19: 10 mL
  Filled 2015-08-19: qty 20

## 2015-08-19 MED ORDER — CEFAZOLIN SODIUM-DEXTROSE 2-3 GM-% IV SOLR
2.0000 g | INTRAVENOUS | Status: DC
  Filled 2015-08-19: qty 50

## 2015-08-19 NOTE — Progress Notes (Signed)
[facility]  Daily Progress Note  Subjective: 5 Days Post-Op: Left Lower Extremity Angiogram complicated by PEA arrest during procedure.  Patient extubated this AM - doing well, sitting up in bed. Unable to follow commands or answer questions appropriately.  Objective: Filed Vitals:   08/19/15 0800 08/19/15 0900 08/19/15 1000 08/19/15 1100  BP: 119/41 136/48 114/43 115/42  Pulse: 57 63 61 59  Temp: 98.9 F (37.2 C)     TempSrc: Axillary     Resp: Height:      Weight:      SpO2: 89% 100% 99% 100%    Intake/Output Summary (Last 24 hours) at 08/19/15 1216 Last data filed at 08/19/15 1009  Gross per 24 hour  Intake 990.55 ml  Output      0 ml  Net 990.55 ml    Physical Exam: Alert not oriented to time or place, NAD, Extubated CV: Paced. Pulmonary: CTA Bilaterally Left Upper Extremity Fistula: Good thrill / bruit, palpable radial pulse Abdomen: Soft, Nontender, Nondistended Groin: Left femoral central line without infection Vascular:  Left Lower Extremity Ulceration: Stable - wrapped with dressing  Laboratory: CBC    Component Value Date/Time   WBC 14.1* 08/19/2015 0428   WBC 14.6* 01/11/2014 1412   HGB 8.0* 08/19/2015 0428   HGB 12.2* 01/11/2014 1412   HCT 25.3* 08/19/2015 0428   HCT 38.4* 01/11/2014 1412   PLT 307 08/19/2015 0428   PLT 203 01/11/2014 1412   BMET    Component Value Date/Time   NA 134* 08/19/2015 0428   NA 140 01/11/2014 1412   K 3.9 08/19/2015 0428   K 5.4* 01/11/2014 1412   CL 92* 08/19/2015 0428   CL 101 01/11/2014 1412   CO2 30 08/19/2015 0428   CO2 28 01/11/2014 1412   GLUCOSE 126* 08/19/2015 0428   GLUCOSE 131* 01/11/2014 1412   BUN 84* 08/19/2015 0428   BUN 73* 01/11/2014 1412   CREATININE 6.96* 08/19/2015 0428   CREATININE 7.96* 01/11/2014 1412   CALCIUM 9.2 08/19/2015 0428   CALCIUM 8.2* 01/11/2014 1412   GFRNONAA 6* 08/19/2015 0428   GFRNONAA 6* 01/11/2014 1412   GFRAA 7* 08/19/2015 0428    GFRAA 7* 01/11/2014 1412   Assessment/Planning: 5 Days Post-Op: Left Lower Extremity Angiogram complicated by PEA arrest during procedure. 1) Extubated 2) SLP consult tomorrow 3) Local wound care for left lower extremity ulceration - unsure about further intervention at this time. 4) Wound Consult for left lower extremity ulceration  Cleda Daub PA-C 08/19/2015 12:16 PM

## 2015-08-19 NOTE — Progress Notes (Signed)
Extubated to 4lnc tolerated well

## 2015-08-19 NOTE — Progress Notes (Signed)
Subjective:  Patient is critically ill Remains vent dependent Dialysis later today Trying to reach for tubes  Objective:  Vital signs in last 24 hours:  Temp:  [98.9 F (37.2 C)-100.3 F (37.9 C)] 98.9 F (37.2 C) (02/25 0800) Pulse Rate:  [57-65] 57 (02/25 0800) Resp:  [13-26] 15 (02/25 0800) BP: (100-126)/(41-70) 119/41 mmHg (02/25 0800) SpO2:  [89 %-100 %] 89 % (02/25 0800) FiO2 (%):  [30 %] 30 % (02/25 0800)  Weight change:  Filed Weights   08/15/15 1615 08/15/15 1945  Weight: 98.2 kg (216 lb 7.9 oz) 96.5 kg (212 lb 11.9 oz)    Intake/Output:    Intake/Output Summary (Last 24 hours) at 08/19/15 0908 Last data filed at 08/19/15 0700  Gross per 24 hour  Intake 1100.35 ml  Output      0 ml  Net 1100.35 ml     Physical Exam: General: Critically ill appearing  HEENT ETT, OGT,    Neck No mass  Pulm/lungs Vent assisted  CVS/Heart Paced rhythm, irregular  Abdomen:  Soft, NT, ND  Extremities: + dependent  edema  Neurologic: Eyes open,   Skin: eccymosis  Access: Left arm AVF, good bruit       Basic Metabolic Panel:   Recent Labs Lab 08/14/15 1236 08/15/15 0525 08/16/15 0508 08/17/15 0510 08/18/15 0509 08/19/15 0428  NA  --  137 138 138 140 134*  K  --  5.5* 4.2 4.2 3.8 3.9  CL  --  96* 97* 95* 96* 92*  CO2  --  32 30  GLUCOSE  --  93 140* 132* 117* 126*  BUN  --  102* 60* 85* 61* 84*  CREATININE 8.81* 9.98* 6.78* 8.02* 5.61* 6.96*  CALCIUM  --  8.9 8.8* 9.1 9.2 9.2  MG 2.2  --   --   --   --   --   PHOS 7.3*  --   --   --   --   --      CBC:  Recent Labs Lab 08/14/15 1235 08/15/15 0525 08/17/15 0510 08/18/15 0509 08/19/15 0428  WBC 25.0* 15.3* 17.0* 13.3* 14.1*  NEUTROABS 23.0*  --   --   --   --   HGB 8.7* 8.6* 8.7* 8.4* 8.0*  HCT 27.7* 26.9* 26.7* 26.4* 25.3*  MCV 91.9 92.6 89.0 91.5 89.9  PLT 346 328 308 301 307      Microbiology:  Recent Results (from the past 720 hour(s))  MRSA PCR Screening     Status: None   Collection Time: 08/14/15 12:35 PM  Result Value Ref Range Status   MRSA by PCR NEGATIVE NEGATIVE Final    Comment:        The GeneXpert MRSA Assay (FDA approved for NASAL specimens only), is one component of a comprehensive MRSA colonization surveillance program. It is not intended to diagnose MRSA infection nor to guide or monitor treatment for MRSA infections.   Culture, expectorated sputum-assessment     Status: None   Collection Time: 08/16/15 12:25 AM  Result Value Ref Range Status   Specimen Description SPUTUM  Final   Special Requests Normal  Final   Sputum evaluation THIS SPECIMEN IS ACCEPTABLE FOR SPUTUM CULTURE  Final   Report Status 08/16/2015 FINAL  Final  Culture, respiratory (NON-Expectorated)     Status: None (Preliminary result)   Collection Time: 08/16/15 12:25 AM  Result Value Ref Range Status   Specimen Description SPUTUM  Final   Special  Requests Normal Reflexed from [id number]  Final   Gram Stain   Final    FEW WBC SEEN NO SQUAMOUS EPITHELIAL CELLS PRESENT FEW GRAM POSITIVE RODS RARE GRAM NEGATIVE RODS EXCELLENT SPECIMEN - 90-100% WBCS    Culture   Final    LIGHT GROWTH GRAM NEGATIVE RODS IDENTIFICATION AND SUSCEPTIBILITIES TO FOLLOW    Report Status PENDING  Incomplete  Culture, blood (Routine X 2) w Reflex to ID Panel     Status: None (Preliminary result)   Collection Time: 08/16/15 10:51 AM  Result Value Ref Range Status   Specimen Description BLOOD RIGHT HAND  Final   Special Requests   Final    BOTTLES DRAWN AEROBIC AND ANAEROBIC ANA AER   Culture NO GROWTH 2 DAYS  Final   Report Status PENDING  Incomplete  Culture, blood (Routine X 2) w Reflex to ID Panel     Status: None (Preliminary result)   Collection Time: 08/16/15 11:09 AM  Result Value Ref Range Status   Specimen Description BLOOD RIGHT HAND  Final   Special Requests BOTTLES DRAWN AEROBIC AND ANAEROBIC  Final   Culture NO GROWTH 2 DAYS  Final   Report Status PENDING   Incomplete  Culture, expectorated sputum-assessment     Status: None   Collection Time: 08/16/15 12:15 PM  Result Value Ref Range Status   Specimen Description EXPECTORATED SPUTUM  Final   Special Requests NONE  Final   Sputum evaluation THIS SPECIMEN IS ACCEPTABLE FOR SPUTUM CULTURE  Final   Report Status 08/16/2015 FINAL  Final  Culture, respiratory (NON-Expectorated)     Status: None (Preliminary result)   Collection Time: 08/16/15 12:15 PM  Result Value Ref Range Status   Specimen Description EXPECTORATED SPUTUM  Final   Special Requests NONE Reflexed from [id number]  Final   Gram Stain   Final    FEW WBC SEEN FEW SQUAMOUS EPITHELIAL CELLS PRESENT MODERATE GRAM POSITIVE RODS FEW GRAM NEGATIVE RODS FEW GRAM POSITIVE COCCI GOOD SPECIMEN - 80-90% WBCS    Culture   Final    HEAVY GROWTH KLEBSIELLA PNEUMONIAE HOLDING FOR POSSIBLE PATHOGEN    Report Status PENDING  Incomplete   Organism ID, Bacteria KLEBSIELLA PNEUMONIAE  Final      Susceptibility   Klebsiella pneumoniae - MIC*    AMPICILLIN >=32 RESISTANT Resistant     CEFAZOLIN <=4 SENSITIVE Sensitive     CEFEPIME <=1 SENSITIVE Sensitive     CEFTAZIDIME <=1 SENSITIVE Sensitive     CEFTRIAXONE <=1 SENSITIVE Sensitive     CIPROFLOXACIN <=0.25 SENSITIVE Sensitive     GENTAMICIN <=1 SENSITIVE Sensitive     IMIPENEM <=0.25 SENSITIVE Sensitive     TRIMETH/SULFA <=20 SENSITIVE Sensitive     AMPICILLIN/SULBACTAM 4 SENSITIVE Sensitive     PIP/TAZO <=4 SENSITIVE Sensitive     Extended ESBL NEGATIVE Sensitive     * HEAVY GROWTH KLEBSIELLA PNEUMONIAE    Coagulation Studies: No results for input(s): LABPROT, INR in the last 72 hours.  Urinalysis: No results for input(s): COLORURINE, LABSPEC, PHURINE, GLUCOSEU, HGBUR, BILIRUBINUR, KETONESUR, PROTEINUR, UROBILINOGEN, NITRITE, LEUKOCYTESUR in the last 72 hours.  Invalid input(s): APPERANCEUR    Imaging: Dg Chest 1 View  08/19/2015  CLINICAL DATA:  82 year old male with respiratory  failure. EXAM: CHEST 1 VIEW COMPARISON:  08/18/2015 FINDINGS: Endotracheal tube with tip 2.1 cm above the carina, NG tube entering the stomach with tip off field of view and left-sided pacemaker again noted. This is a low volume  film. Cardiomegaly and mild pulmonary vascular congestion again noted. There is no evidence of pneumothorax. IMPRESSION: Low volume film with support apparatus as described, cardiomegaly and mild pulmonary vascular congestion. Electronically Signed   By: Harmon Pier M.D.   On: 08/19/2015 08:00   Dg Chest 1 View  08/18/2015  CLINICAL DATA:  Dyspnea, cardiac arrest and respiratory arrest, former smoker. , intubated patient. EXAM: CHEST 1 VIEW COMPARISON:  Portable chest x-ray of August 17, 2015 FINDINGS: The lungs remain mildly hypoinflated. The interstitial markings remain mildly prominent. The cardiac silhouette remains enlarged. The pulmonary vascularity is less engorged. The endotracheal tube tip lies 3 cm above the carina. The permanent pacemaker electrodes are in stable position. An esophagogastric tube tip projects below the inferior margin of the image. IMPRESSION: Slight interval improvement in the appearance of the pulmonary interstitium and pulmonary vascularity consistent with improving CHF. Stable cardiomegaly. Persistent mild hypo inflation. The endotracheal tube and other support devices are in reasonable position. Electronically Signed   By: David  [place] M.D.   On: 08/18/2015 07:10   Dg Abd Portable 1v  08/18/2015  CLINICAL DATA:  NG tube placement EXAM: PORTABLE ABDOMEN - 1 VIEW COMPARISON:  08/15/2015 FINDINGS: NG tube tip is in the distal stomach. Gas within mildly prominent transverse colon. IMPRESSION: NG tube tip in the distal stomach. Electronically Signed   By: Charlett Nose M.D.   On: 08/18/2015 18:52     Medications:   . feeding supplement (VITAL HIGH PROTEIN) 1,000 mL (08/19/15 0700)  . norepinephrine (LEVOPHED) Adult infusion 7 mcg/min (08/19/15 0845)    . antiseptic oral rinse  7 mL Mouth Rinse QID  . ceFEPime (MAXIPIME) IV  1 g Intravenous Q24H  . chlorhexidine gluconate  15 mL Mouth Rinse BID  . feeding supplement (PRO-STAT SUGAR FREE 64)  30 mL Per Tube QID  . free water  100 mL Per Tube 3 times per day  . heparin subcutaneous  5,000 Units Subcutaneous Q12H  . insulin aspart  0-15 Units Subcutaneous 6 times per day  . ipratropium-albuterol  3 mL Nebulization Q6H  . pantoprazole sodium  40 mg Per Tube Daily  . senna-docusate  1 tablet Oral BID  . sodium chloride flush  10-40 mL Intracatheter Q12H   acetaminophen, albuterol, fentaNYL (SUBLIMAZE) injection, fentaNYL (SUBLIMAZE) injection, midazolam, midazolam, sodium chloride flush  Assessment/ Plan:  82 y.o. male Mr. GERON MULFORD is a 82 y.o. white male with Parkinson's, coronary artery disease, hyperlipidemia, hypertension, GERD,. 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 80% stenosis of the tibioperoneal trunk with peroneal runoff distally which was diffusely diseased. Occlusion of the anterior tibial artery with reconstitution of the anterior tibial artery in the lower leg, hypoxic PEA arrest 08/14/15, Left ankle fracture: ORIF for left ankle. 12/7 Dr. Joice Lofts,   [facility] M[facility] Serve Cheree Ditto  1. End Stage Renal Disease   - HD today  2.  Acute resp failure S/P hypoxic PEA arrest - vent assisted, Klebsiella Pneumonia - Fio2 30%  3. Secondary Hyperparathyroidism  - monitor ca/phos this admission  4. Anemia of chronic kidney disease: hemoglobin 8.0 - epo with hemodialysis treatment   5. dependent edema - UF with HD as tolerated      LOS: 5 Rockwell Zentz 2/25/20179:08 AM

## 2015-08-19 NOTE — Progress Notes (Signed)
[facility] [place] [facility]   PATIENT NAME: [name]    MR#:  [id number]  DATE OF BIRTH:  10-25-1928  SUBJECTIVE:  Unable to obtain patient intubated sedated Patient tracking and following commands  REVIEW OF SYSTEMS:  Unable to obtain patient's intubated sedated  DRUG ALLERGIES:   Allergies  Allergen Reactions  . Penicillins Swelling    VITALS:  Blood pressure 115/42, pulse 59, temperature 98.9 F (37.2 C), temperature source Axillary, resp. rate 16, height  (1.753 m), weight 96.5 kg (212 lb 11.9 oz), SpO2 100 %.  PHYSICAL EXAMINATION:   VITAL SIGNS: Filed Vitals:   08/19/15 1000 08/19/15 1100  BP: 114/43 115/42  Pulse: 61 59  Temp:    Resp: 22 16   GENERAL:82 y.o.male critically ill and intubated sedated HEAD: Normocephalic, atraumatic.  EYES: Pupils equal, round, reactive to light. Unable to assess extraocular muscles given mental status/medical condition. No scleral icterus.  MOUTH: Moist mucosal membrane. Dentition intact. No abscess noted.  EAR, NOSE, THROAT: Clear without exudates. No external lesions.  NECK: Supple. No thyromegaly. No nodules. No JVD.  PULMONARY: Clear to ascultation, without wheeze rails or rhonci. No use of accessory muscles, Good respiratory effort. good air entry bilaterally CHEST: Nontender to palpation.  CARDIOVASCULAR: S1 and S2. Regular rate and rhythm. No murmurs, rubs, or gallops. No edema. Pedal pulses 2+ bilaterally.  GASTROINTESTINAL: Soft, nontender, nondistended. No masses. Positive bowel sounds. No hepatosplenomegaly.  MUSCULOSKELETAL: No swelling, clubbing, or edema. Range of motion full in all extremities.  NEUROLOGIC: Unable to assess given mental status/medical condition - patient is tracking and following commands does appear to have weakness right upper extremity SKIN: No ulceration, lesions, rashes, or cyanosis. Skin warm and dry. Turgor intact.  PSYCHIATRIC: Unable to assess given  mental status/medical condition       LABORATORY PANEL:   CBC  Recent Labs Lab 08/19/15 0428  WBC 14.1*  HGB 8.0*  HCT 25.3*  PLT 307   ------------------------------------------------------------------------------------------------------------------  Chemistries   Recent Labs Lab 08/14/15 1236  08/19/15 0428  NA  --   < > 134*  K  --   < > 3.9  CL  --   < > 92*  CO2  --   < > 30  GLUCOSE  --   < > 126*  BUN  --   < > 84*  CREATININE 8.81*  < > 6.96*  CALCIUM  --   < > 9.2  MG 2.2  --   --   AST 34  --   --   ALT 6*  --   --   ALKPHOS 98  --   --   BILITOT 1.2  --   --   < > = values in this interval not displayed. ------------------------------------------------------------------------------------------------------------------  Cardiac Enzymes  Recent Labs Lab 08/15/15 0525  TROPONINI 1.60*   ------------------------------------------------------------------------------------------------------------------  RADIOLOGY:  Dg Chest 1 View  08/19/2015  CLINICAL DATA:  82 year old male with respiratory failure. EXAM: CHEST 1 VIEW COMPARISON:  08/18/2015 FINDINGS: Endotracheal tube with tip 2.1 cm above the carina, NG tube entering the stomach with tip off field of view and left-sided pacemaker again noted. This is a low volume film. Cardiomegaly and mild pulmonary vascular congestion again noted. There is no evidence of pneumothorax. IMPRESSION: Low volume film with support apparatus as described, cardiomegaly and mild pulmonary vascular congestion. Electronically Signed   By: Harmon Pier M.D.   On: 08/19/2015 08:00   Dg  Chest 1 View  08/18/2015  CLINICAL DATA:  Dyspnea, cardiac arrest and respiratory arrest, former smoker. , intubated patient. EXAM: CHEST 1 VIEW COMPARISON:  Portable chest x-ray of August 17, 2015 FINDINGS: The lungs remain mildly hypoinflated. The interstitial markings remain mildly prominent. The cardiac silhouette remains enlarged. The  pulmonary vascularity is less engorged. The endotracheal tube tip lies 3 cm above the carina. The permanent pacemaker electrodes are in stable position. An esophagogastric tube tip projects below the inferior margin of the image. IMPRESSION: Slight interval improvement in the appearance of the pulmonary interstitium and pulmonary vascularity consistent with improving CHF. Stable cardiomegaly. Persistent mild hypo inflation. The endotracheal tube and other support devices are in reasonable position. Electronically Signed   By: Skyy Mcknight  [place] M.D.   On: 08/18/2015 07:10   Dg Abd Portable 1v  08/18/2015  CLINICAL DATA:  NG tube placement EXAM: PORTABLE ABDOMEN - 1 VIEW COMPARISON:  08/15/2015 FINDINGS: NG tube tip is in the distal stomach. Gas within mildly prominent transverse colon. IMPRESSION: NG tube tip in the distal stomach. Electronically Signed   By: Charlett Nose M.D.   On: 08/18/2015 18:52    EKG:   Orders placed or performed during the hospital encounter of 08/14/15  . EKG 12-Lead  . EKG 12-Lead    ASSESSMENT AND PLAN:   82 year old gentleman admitted 2/20/17post arrest doing a vascular procedure     1. Acute cardiac arrest. PEA arrest. Overall prognosis poor. Supportive care at this point. 2. Cardiogenic shock still on levophed  3. End-stage renal disease on hemodialysis. Nephrology to continue with dialysis. 4. Type 2 diabetes- sliding-scale insulin for now 5. History of chronic systolic heart failure. 6. Peripheral vascular disease with ulcer left medial malleolus. Local wound care. 7. Anemia of chronic disease on Epogen with dialysis 8. Nutrition tube feeding to be started  Tolerated spontaneous breathing trial yesterday, more alert mentally today discussed case with pulmonology/intensivist potential extubation later today   All the records are reviewed and case discussed with Care Management/Social Workerr. Management plans discussed with the patient, family and they are  in agreement.  CODE STATUS: dnr  TOTAL TIME TAKING CARE OF THIS PATIENT: 28 minutes.   POSSIBLE D/C IN 2-3 DAYS, DEPENDING ON CLINICAL CONDITION.   Victorio Creeden,  Mardi Mainland.D on 08/19/2015 at 1:49 PM  Between 7am to 6pm - Pager - [phone]  After 6pm: House Pager: - [phone]  [facility]  Office  [phone]  CC: Primary care physician; Lauro Regulus., MD

## 2015-08-19 NOTE — Progress Notes (Signed)
Name: [name] MRN: [id number] DOB: 02/07/1929    ADMISSION DATE:  08/14/2015 DATE OF CONSULTATION: 02/20  PT PROFILE: 11 M with ESRD, prior CVA, prior MI, ESRD on home HD underwent complex angioplasty to LLE. During procedure had problems with hypoxemia, then suffered PEA arrest requiring intubation and 2 mins ACLS.    MAJOR EVENTS/TEST RESULTS: 02/20 Angioplasty to LLE c/b PEA arrest. Admitted to ICU after 2 mins ACLS. Intubated 02/20 LE venous US:  02/20 CT head: NAD 02/20 TTE: LVEF 50-55% 02/22 CT head: NAD 02/23 EEG: This is an abnormal electroencephalogram secondary to general background slowing and the presence of frequent triphasic waves. Triphasic waves are seen most commonly in encephalopathic states  INDWELLING DEVICES:: ETT 02/20 >> 02/25 R femoral CVL 02/20 >>    Microbiology results: MRSA PCR 2/20 >> NEG Blood 02/22 >> NEG Resp 2/22 >> Klebsiella (pansens)  Antimicrobials: Cefepime 2/22 >> 02/25 Cefazolin 02/25 >>   SUBJECTIVE:  Very HOH. RASS -1. + F/C  VITAL SIGNS: Temp:  [98.9 F (37.2 C)-100.3 F (37.9 C)] 98.9 F (37.2 C) (02/25 0800) Pulse Rate:  [57-65] 59 (02/25 1100) Resp:  [13-26] 16 (02/25 1100) BP: (100-136)/(41-70) 115/42 mmHg (02/25 1100) SpO2:  [89 %-100 %] 100 % (02/25 1100) FiO2 (%):  [30 %] 30 % (02/25 0800) HEMODYNAMICS:   VENTILATOR SETTINGS: Vent Mode:  [-] PRVC FiO2 (%):  [30 %] 30 % Set Rate:  [15 bmp] 15 bmp Vt Set:  [500 mL] 500 mL PEEP:  [5 cmH20] 5 cmH20 Pressure Support:  [5 cmH20] 5 cmH20 Plateau Pressure:  [13 cmH20] 13 cmH20 INTAKE / OUTPUT:  Intake/Output Summary (Last 24 hours) at 08/19/15 1204 Last data filed at 08/19/15 1009  Gross per 24 hour  Intake 990.55 ml  Output      0 ml  Net 990.55 ml    PHYSICAL EXAMINATION: Physical Examination:   VS: BP 115/42 mmHg  Pulse 59  Temp(Src) 98.9 F (37.2 C) (Axillary)  Resp 16  Ht  (1.753 m)  Wt 212 lb 11.9 oz (96.5 kg)  BMI 31.40 kg/m2  SpO2  100%  General Appearance: No distress  Neuro:without focal findings, mental status Reduced, lethargic. Does not follow commands.  HEENT: PERRLA, EOM intact. Pulmonary: normal breath sounds   CardiovascularNormal S1,S2.  No m/r/g.   Abdomen: Benign, Soft, non-tender. Renal:  No costovertebral tenderness  GU:  Not performed at this time. Endocrine: No evident thyromegaly. Skin:   warm, no rashes, no ecchymosis  Extremities: normal, no cyanosis, clubbing.   LABS:   LABORATORY PANEL:   CBC  Recent Labs Lab 08/19/15 0428  WBC 14.1*  HGB 8.0*  HCT 25.3*  PLT 307    Chemistries   Recent Labs Lab 08/14/15 1236  08/19/15 0428  NA  --   < > 134*  K  --   < > 3.9  CL  --   < > 92*  CO2  --   < > 30  GLUCOSE  --   < > 126*  BUN  --   < > 84*  CREATININE 8.81*  < > 6.96*  CALCIUM  --   < > 9.2  MG 2.2  --   --   PHOS 7.3*  --   --   AST 34  --   --   ALT 6*  --   --   ALKPHOS 98  --   --   BILITOT 1.2  --   --   < > =  values in this interval not displayed.   Recent Labs Lab 08/18/15 1613 08/18/15 1945 08/18/15 2353 08/19/15 0345 08/19/15 0742 08/19/15 1134  GLUCAP 118* 98 133* 114* 116* 101*    Recent Labs Lab 08/16/15 0445 08/17/15 0500 08/18/15 0437  PHART 7.43 7.40 7.46*  PCO2ART 43 47 47  PO2ART 141* 114* 92    Recent Labs Lab 08/14/15 1236  AST 34  ALT 6*  ALKPHOS 98  BILITOT 1.2  ALBUMIN 3.0*    Cardiac Enzymes  Recent Labs Lab 08/15/15 0525  TROPONINI 1.60*   CXR: Low volumes, no definite infiltrates  ASSESSMENT / PLAN:    PULMONARY A: VDRF post cardiac arrest Passed SBT 02/25 P:  Extubate today Supplemental O2 to maitnain SpO2 > 90% Need to clarify re-intubation status  CARDIOVASCULAR A:  PEA Cardiac arrest 02/20 - etiology unclear Hypotension, improving Ischemic CM Severe PVD P:  MAP goal > 60 mmHg Wean the Levophed as tolerated  RENAL A:  ESRD on home HD P:  Monitor BMET  intermittently Monitor I/Os Correct electrolytes as indicated Dialysis per nephrology  GASTROINTESTINAL A:  No acute issues P:  GI prophylaxis: IV PPI NPO post extubation  HEMATOLOGIC A:  Anemia of chronic disease/CKD P:  DVT px: SQ heparin Monitor CBC intermittently Transfuse per usual guidelines  INFECTIOUS A:  Fever Possible PNA - Klebsiella in resp culture P:  Monitor temp, WBC count Micro and abx as above  ENDOCRINE A:  Hyperglycemia, minimal Risk of hypoglycemia after TFs stopped P:  DC SSI  Monitor CBGs  NEUROLOGIC A:  Post anoxic encephalopathy - markedly improved Very HOH P:  RASS goal: 0 Minimize sedating medications   CCM time: 45 mins The above time includes time spent in consultation with patient and/or family members and reviewing care plan on multidisciplinary rounds and recheck post extubation  [name], MD PCCM service Mobile [phone] Pager [phone]

## 2015-08-19 NOTE — Progress Notes (Signed)
Changed pt vent settings to [facility] for pt agitation and increased RR. Will allow pt to rest and try wean wean in a.m. As tol.

## 2015-08-19 NOTE — Progress Notes (Signed)
PT Cancellation Note  Patient Details Name: DEL OVERFELT MRN: [id number] DOB: [birth year]   Cancelled Treatment:    Reason Eval/Treat Not Completed: Medical issues which prohibited therapy (Consult received and chart reviewed.  Order noted with start date of 2/26.  As patient just extubated this date (approx 4 hours ago), will maintain current order and hold initiation of PT until next date to ensure respiratory stability prior to introduction of physical activity.)   Tiesha Marich H. Manson Passey, PT, DPT, NCS 08/19/2015, 2:27 PM [phone]

## 2015-08-19 NOTE — Progress Notes (Signed)
Pt was extubated this morning and continues tracking and following commands. Pt is able to answer yes and no questions appropriately with the use of his head set hearing device. Lung sounds are very course with copious amounts of thick/frothy, white secretions expectorated with a yanunker and oropharyngeal suction device; although the pt has a strong cough, he is unable to cough up the secretions. Pt has O2 sats 100% on 2L [place]. Pt continues on levophed at 4 mcgs with new MAP order >60. Dialysis nurse called and has rescheduled dialysis for tomorrow morning with verification from Dr Thedore Mins. Pt has been afebrile this shift. Report given to next shift nurse Micheline Rough, RN.

## 2015-08-19 NOTE — Progress Notes (Signed)
Nutrition Follow-up:  Pt extubated this am.  Currently NPO, with SLP consult placed for tomorrow.  Tube feeding stopped with extubation.   Will await SLP recommendations to further determine nutrition poc.   HIGH Care Level  Cornelious Bartolucci B. Freida Busman, RD, LDN [phone] (pager) Weekend/On-Call pager [phone])

## 2015-08-19 NOTE — Progress Notes (Signed)
Pharmacy Antibiotic Note -Day 2  [name] is a 82 y.o. male admitted on 08/14/2015 with pneumonia.  Pharmacy has been consulted for cefazolin dosing for heavy growth klebsiella. Patient receives HD MWF.  Plan: Initiate cefazolin 2g IV qHD session.   Height:  (175.3 cm) Weight:  (scale not weighing) IBW/kg (Calculated) : 70.7  Temp (24hrs), Avg:99.7 F (37.6 C), Min:98.9 F (37.2 C), Max:100.3 F (37.9 C)   Recent Labs Lab 08/14/15 1235  08/15/15 0525 08/16/15 0508 08/17/15 0510 08/18/15 0509 08/19/15 0428  WBC 25.0*  --  15.3*  --  17.0* 13.3* 14.1*  CREATININE 8.94*  < > 9.98* 6.78* 8.02* 5.61* 6.96*  < > = values in this interval not displayed.  Estimated Creatinine Clearance: 9.1 mL/min (by C-G formula based on Cr of 6.96).    Allergies  Allergen Reactions  . Penicillins Swelling    Antimicrobials this admission: Cefepime 2/22 >> 2/25 Cefazolin 2/25 >>   Microbiology results: 2/22 BCx x 2: NGTD 2/22 Sputum: heavy growth gram negative rods, Klebsiella   2/20 MRSA PCR: negative   Pharmacy will continue to monitor and adjust per consult.    Cy Blamer 08/19/2015 9:32 AM

## 2015-08-19 NOTE — Consult Note (Signed)
WOC wound consult note Reason for Consult:Reconsulted for wound care. My partner, K. Baird Cancer saw this patient on 08/14/15. Wound type:Mixed etiology wounds: venous and arterial, moisture associated skin damage, pressure Pressure Ulcer POA: Yes Measurement: Sacral Stage 2:  1cm x 2.5cm x 0.2c, red, moist wound bed, Scant serous exudate.  Right posterior heel: 1.5cm round with black eschar obscuring wound bed assessment and assessment of depth, no exudate.  Left foot, plantar aspect of 1st metatarsal head:  1cm round x 0.2cm deep full thickness ulcer with dull red wound bed, no exudate. Left medial malleolus, two ulcer (fullthickness):  Proximal:  3cm x 4cm x 0.5cm.  Distal:  3cm x 6cm x 0.5cm. Both wounds with dry wound beds, 100% non-viable. Wound bed:As described above. Drainage (amount, consistency, odor) As described above. Periwound:Intact, dry, mild edema to bilateral LEs. Dressing procedure/placement/frequency:Because the wound beds are now dry to the left medial malleolar area wounds, I will discontinue the calcium alginate in favor of twice daily saline dressings as well as add that care to the right posterior heel and the left 1st met head wounds.  The sacral area is moisture associated and the patient will benefit from turning and repositioning to minimize pressure in this area as well as the provision of a moisture barrier ointment to facilitate reepithelialization. Bilateral pressure redistribution heel boots are already in place. [facility] nursing team will not follow, but will remain available to this patient, the nursing and medical teams.  Please re-consult if needed for wound status changes.. Thanks, Maudie Flakes, MSN, RN, [place], Arther Abbott  Pager# [phone]

## 2015-08-20 LAB — GLUCOSE, CAPILLARY
GLUCOSE-CAPILLARY: 91 mg/dL (ref 65–99)
Glucose-Capillary: 83 mg/dL (ref 65–99)
Glucose-Capillary: 87 mg/dL (ref 65–99)
Glucose-Capillary: 94 mg/dL (ref 65–99)

## 2015-08-20 LAB — BASIC METABOLIC PANEL
ANION GAP: 17 — AB (ref 5–15)
BUN: 105 mg/dL — AB (ref 6–20)
CHLORIDE: 94 mmol/L — AB (ref 101–111)
CO2: 28 mmol/L (ref 22–32)
Calcium: 10 mg/dL (ref 8.9–10.3)
Creatinine, Ser: 8.2 mg/dL — ABNORMAL HIGH (ref 0.61–1.24)
GFR calc Af Amer: 6 mL/min — ABNORMAL LOW (ref >60)
GFR, EST NON AFRICAN AMERICAN: 5 mL/min — AB (ref >60)
GLUCOSE: 106 mg/dL — AB (ref 65–99)
POTASSIUM: 4.6 mmol/L (ref 3.5–5.1)
Sodium: 139 mmol/L (ref 135–145)

## 2015-08-20 LAB — CBC
HEMATOCRIT: 25 % — AB (ref 40.0–52.0)
HEMOGLOBIN: 8 g/dL — AB (ref 13.0–18.0)
MCH: 28.8 pg (ref 26.0–34.0)
MCHC: 32.1 g/dL (ref 32.0–36.0)
MCV: 89.6 fL (ref 80.0–100.0)
Platelets: 285 10*3/uL (ref 150–440)
RBC: 2.79 MIL/uL — ABNORMAL LOW (ref 4.40–5.90)
RDW: 16.4 % — ABNORMAL HIGH (ref 11.5–14.5)
WBC: 10.8 10*3/uL — ABNORMAL HIGH (ref 3.8–10.6)

## 2015-08-20 MED ORDER — PANTOPRAZOLE SODIUM 40 MG PO TBEC
40.0000 mg | DELAYED_RELEASE_TABLET | Freq: Every day | ORAL | Status: DC
  Administered 2015-08-21 – 2015-08-24 (×4): 40 mg via ORAL
  Filled 2015-08-20 (×4): qty 1

## 2015-08-20 MED ORDER — CHLORHEXIDINE GLUCONATE 0.12 % MT SOLN
15.0000 mL | Freq: Two times a day (BID) | OROMUCOSAL | Status: DC
  Administered 2015-08-20 – 2015-08-22 (×4): 15 mL via OROMUCOSAL
  Filled 2015-08-20 (×4): qty 15

## 2015-08-20 MED ORDER — ALBUMIN HUMAN 25 % IV SOLN
12.5000 g | Freq: Once | INTRAVENOUS | Status: AC
  Administered 2015-08-20: 12.5 g via INTRAVENOUS
  Filled 2015-08-20: qty 50

## 2015-08-20 MED ORDER — CETYLPYRIDINIUM CHLORIDE 0.05 % MT LIQD
7.0000 mL | Freq: Two times a day (BID) | OROMUCOSAL | Status: DC
  Administered 2015-08-21 – 2015-08-22 (×2): 7 mL via OROMUCOSAL

## 2015-08-20 MED ORDER — LORAZEPAM 2 MG/ML IJ SOLN
1.0000 mg | Freq: Once | INTRAMUSCULAR | Status: AC
  Administered 2015-08-24: 1 mg via INTRAVENOUS
  Filled 2015-08-20 (×2): qty 1

## 2015-08-20 MED ORDER — ALBUTEROL SULFATE (2.5 MG/3ML) 0.083% IN NEBU
2.5000 mg | INHALATION_SOLUTION | Freq: Four times a day (QID) | RESPIRATORY_TRACT | Status: DC
  Administered 2015-08-20 – 2015-08-22 (×8): 2.5 mg via RESPIRATORY_TRACT
  Filled 2015-08-20 (×8): qty 3

## 2015-08-20 MED ORDER — MIDODRINE HCL 5 MG PO TABS
5.0000 mg | ORAL_TABLET | Freq: Three times a day (TID) | ORAL | Status: DC
  Administered 2015-08-20 – 2015-08-24 (×14): 5 mg via ORAL
  Filled 2015-08-20 (×14): qty 1

## 2015-08-20 MED ORDER — NOREPINEPHRINE BITARTRATE 1 MG/ML IV SOLN: 0.0000 ug/min | INTRAVENOUS | Status: DC

## 2015-08-20 NOTE — Progress Notes (Signed)
This note also relates to the following rows which could not be included: Pulse Rate - Cannot attach notes to unvalidated device data Resp - Cannot attach notes to unvalidated device data BP - Cannot attach notes to unvalidated device data   Discontinued tx d/t restlessness that caused puncture of arterial aspect of right AVF when patient was attempting to feed himself or reaching for his head.  Control unit was stopped and direct pressure applied to site while rinsing back patients blood.  Both dialysis needles were removed after rinseback and direct pressure applied to all three sites until bleeding was stopped.  ICU nurse Kern Alberta RN notified of treatment difficulties and Dr. Thedore Mins notified.  Bleeding was stopped and gauze applied to AVF and secured with paper tape.

## 2015-08-20 NOTE — Progress Notes (Signed)
Subjective:  Patient is critically ill  Dialysis later today Trying to reach for tubes  Objective:  Vital signs in last 24 hours:  Temp:  [97.3 F (36.3 C)-98.6 F (37 C)] 97.9 F (36.6 C) (02/26 1000) Pulse Rate:  [52-62] 61 (02/26 1100) Resp:  [13-25] 17 (02/26 1100) BP: (91-123)/(41-80) 100/46 mmHg (02/26 1100) SpO2:  [82 %-100 %] 100 % (02/26 1100)  Weight change:  Filed Weights   08/15/15 1615 08/15/15 1945  Weight: 98.2 kg (216 lb 7.9 oz) 96.5 kg (212 lb 11.9 oz)    Intake/Output:    Intake/Output Summary (Last 24 hours) at 08/20/15 1143 Last data filed at 08/20/15 0900  Gross per 24 hour  Intake 123.49 ml  Output      0 ml  Net 123.49 ml     Physical Exam: General: Critically ill appearing  HEENT ETT, OGT,    Neck No mass  Pulm/lungs Vent assisted  CVS/Heart Paced rhythm, irregular  Abdomen:  Soft, NT, ND  Extremities: + dependent  edema  Neurologic: Eyes open,   Skin: eccymosis  Access: Left arm AVF, good bruit       Basic Metabolic Panel:   Recent Labs Lab 08/14/15 1236  08/16/15 0508 08/17/15 0510 08/18/15 0509 08/19/15 0428 08/20/15 0457  NA  --   < > 138 138 140 134* 139  K  --   < > 4.2 4.2 3.8 3.9 4.6  CL  --   < > 97* 95* 96* 92* 94*  CO2  --   < > 28 28 32 30 28  GLUCOSE  --   < > 140* 132* 117* 126* 106*  BUN  --   < > 60* 85* 61* 84* 105*  CREATININE 8.81*  < > 6.78* 8.02* 5.61* 6.96* 8.20*  CALCIUM  --   < > 8.8* 9.1 9.2 9.2 10.0  MG 2.2  --   --   --   --   --   --   PHOS 7.3*  --   --   --   --   --   --   < > = values in this interval not displayed.   CBC:  Recent Labs Lab 08/14/15 1235 08/15/15 0525 08/17/15 0510 08/18/15 0509 08/19/15 0428 08/20/15 0457  WBC 25.0* 15.3* 17.0* 13.3* 14.1* 10.8*  NEUTROABS 23.0*  --   --   --   --   --   HGB 8.7* 8.6* 8.7* 8.4* 8.0* 8.0*  HCT 27.7* 26.9* 26.7* 26.4* 25.3* 25.0*  MCV 91.9 92.6 89.0 91.5 89.9 89.6  PLT 346 328 308 301 307 285       Microbiology:  Recent Results (from the past 720 hour(s))  MRSA PCR Screening     Status: None   Collection Time: 08/14/15 12:35 PM  Result Value Ref Range Status   MRSA by PCR NEGATIVE NEGATIVE Final    Comment:        The GeneXpert MRSA Assay (FDA approved for NASAL specimens only), is one component of a comprehensive MRSA colonization surveillance program. It is not intended to diagnose MRSA infection nor to guide or monitor treatment for MRSA infections.   Culture, expectorated sputum-assessment     Status: None   Collection Time: 08/16/15 12:25 AM  Result Value Ref Range Status   Specimen Description SPUTUM  Final   Special Requests Normal  Final   Sputum evaluation THIS SPECIMEN IS ACCEPTABLE FOR SPUTUM CULTURE  Final   Report Status  08/16/2015 FINAL  Final  Culture, respiratory (NON-Expectorated)     Status: None   Collection Time: 08/16/15 12:25 AM  Result Value Ref Range Status   Specimen Description SPUTUM  Final   Special Requests Normal Reflexed from [id number]  Final   Gram Stain   Final    FEW WBC SEEN NO SQUAMOUS EPITHELIAL CELLS PRESENT FEW GRAM POSITIVE RODS RARE GRAM NEGATIVE RODS EXCELLENT SPECIMEN - 90-100% WBCS    Culture LIGHT GROWTH KLEBSIELLA PNEUMONIAE  Final   Report Status 08/19/2015 FINAL  Final   Organism ID, Bacteria KLEBSIELLA PNEUMONIAE  Final      Susceptibility   Klebsiella pneumoniae - MIC*    AMPICILLIN >=32 RESISTANT Resistant     CEFAZOLIN <=4 SENSITIVE Sensitive     CEFEPIME <=1 SENSITIVE Sensitive     CEFTAZIDIME <=1 SENSITIVE Sensitive     CEFTRIAXONE <=1 SENSITIVE Sensitive     CIPROFLOXACIN <=0.25 SENSITIVE Sensitive     GENTAMICIN <=1 SENSITIVE Sensitive     IMIPENEM <=0.25 SENSITIVE Sensitive     TRIMETH/SULFA <=20 SENSITIVE Sensitive     AMPICILLIN/SULBACTAM 4 SENSITIVE Sensitive     PIP/TAZO <=4 SENSITIVE Sensitive     Extended ESBL NEGATIVE Sensitive     * LIGHT GROWTH KLEBSIELLA PNEUMONIAE  Culture, blood  (Routine X 2) w Reflex to ID Panel     Status: None (Preliminary result)   Collection Time: 08/16/15 10:51 AM  Result Value Ref Range Status   Specimen Description BLOOD RIGHT HAND  Final   Special Requests   Final    BOTTLES DRAWN AEROBIC AND ANAEROBIC ANA AER   Culture NO GROWTH 4 DAYS  Final   Report Status PENDING  Incomplete  Culture, blood (Routine X 2) w Reflex to ID Panel     Status: None (Preliminary result)   Collection Time: 08/16/15 11:09 AM  Result Value Ref Range Status   Specimen Description BLOOD RIGHT HAND  Final   Special Requests BOTTLES DRAWN AEROBIC AND ANAEROBIC  Final   Culture NO GROWTH 4 DAYS  Final   Report Status PENDING  Incomplete  Culture, expectorated sputum-assessment     Status: None   Collection Time: 08/16/15 12:15 PM  Result Value Ref Range Status   Specimen Description EXPECTORATED SPUTUM  Final   Special Requests NONE  Final   Sputum evaluation THIS SPECIMEN IS ACCEPTABLE FOR SPUTUM CULTURE  Final   Report Status 08/16/2015 FINAL  Final  Culture, respiratory (NON-Expectorated)     Status: None   Collection Time: 08/16/15 12:15 PM  Result Value Ref Range Status   Specimen Description EXPECTORATED SPUTUM  Final   Special Requests NONE Reflexed from [id number]  Final   Gram Stain   Final    FEW WBC SEEN FEW SQUAMOUS EPITHELIAL CELLS PRESENT MODERATE GRAM POSITIVE RODS FEW GRAM NEGATIVE RODS FEW GRAM POSITIVE COCCI GOOD SPECIMEN - 80-90% WBCS    Culture   Final    HEAVY GROWTH KLEBSIELLA PNEUMONIAE HEAVY GROWTH HAEMOPHILUS PARAINFLUENZAE BETA LACTAMASE NEGATIVE    Report Status 08/19/2015 FINAL  Final   Organism ID, Bacteria KLEBSIELLA PNEUMONIAE  Final      Susceptibility   Klebsiella pneumoniae - MIC*    AMPICILLIN >=32 RESISTANT Resistant     CEFAZOLIN <=4 SENSITIVE Sensitive     CEFEPIME <=1 SENSITIVE Sensitive     CEFTAZIDIME <=1 SENSITIVE Sensitive     CEFTRIAXONE <=1 SENSITIVE Sensitive     CIPROFLOXACIN <=0.25  SENSITIVE Sensitive  GENTAMICIN <=1 SENSITIVE Sensitive     IMIPENEM <=0.25 SENSITIVE Sensitive     TRIMETH/SULFA <=20 SENSITIVE Sensitive     AMPICILLIN/SULBACTAM 4 SENSITIVE Sensitive     PIP/TAZO <=4 SENSITIVE Sensitive     Extended ESBL NEGATIVE Sensitive     * HEAVY GROWTH KLEBSIELLA PNEUMONIAE    Coagulation Studies: No results for input(s): LABPROT, INR in the last 72 hours.  Urinalysis: No results for input(s): COLORURINE, LABSPEC, PHURINE, GLUCOSEU, HGBUR, BILIRUBINUR, KETONESUR, PROTEINUR, UROBILINOGEN, NITRITE, LEUKOCYTESUR in the last 72 hours.  Invalid input(s): APPERANCEUR    Imaging: Dg Chest 1 View  08/19/2015  CLINICAL DATA:  82 year old male with respiratory failure. EXAM: CHEST 1 VIEW COMPARISON:  08/18/2015 FINDINGS: Endotracheal tube with tip 2.1 cm above the carina, NG tube entering the stomach with tip off field of view and left-sided pacemaker again noted. This is a low volume film. Cardiomegaly and mild pulmonary vascular congestion again noted. There is no evidence of pneumothorax. IMPRESSION: Low volume film with support apparatus as described, cardiomegaly and mild pulmonary vascular congestion. Electronically Signed   By: Harmon Pier M.D.   On: 08/19/2015 08:00   Dg Abd Portable 1v  08/18/2015  CLINICAL DATA:  NG tube placement EXAM: PORTABLE ABDOMEN - 1 VIEW COMPARISON:  08/15/2015 FINDINGS: NG tube tip is in the distal stomach. Gas within mildly prominent transverse colon. IMPRESSION: NG tube tip in the distal stomach. Electronically Signed   By: Charlett Nose M.D.   On: 08/18/2015 18:52     Medications:   . norepinephrine (LEVOPHED) Adult infusion 2 mcg/min (08/20/15 0630)   . albuterol  2.5 mg Nebulization Q6H  . [START ON 08/21/2015]  ceFAZolin (ANCEF) IV  2 g Intravenous Q M,W,F-HD  . heparin subcutaneous  5,000 Units Subcutaneous Q12H  . midodrine  5 mg Oral TID WC  . [START ON 08/21/2015] pantoprazole  40 mg Oral Q1200  . sodium chloride flush   10-40 mL Intracatheter Q12H   acetaminophen, albuterol, morphine injection, sodium chloride flush  Assessment/ Plan:  82 y.o. male [name] is a 82 y.o. white male with Parkinson's, coronary artery disease, hyperlipidemia, hypertension, GERD,. 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 80% stenosis of the tibioperoneal trunk with peroneal runoff distally which was diffusely diseased. Occlusion of the anterior tibial artery with reconstitution of the anterior tibial artery in the lower leg, hypoxic PEA arrest 08/14/15, Left ankle fracture: ORIF for left ankle. 12/7 Dr. Joice Lofts,   [facility] M[facility] [place] Ditto  1. End Stage Renal Disease  / MWF/  - HD today (as it was postponed from yesterday) - UF as tolerated - Next HD  tue/wed   2.  Acute resp failure S/P hypoxic PEA arrest -  Klebsiella Pneumonia - extubated saturday  3. Secondary Hyperparathyroidism  - monitor ca/phos this admission  4. Anemia of chronic kidney disease: hemoglobin 8.0 - epo with hemodialysis treatment   5. dependent edema - UF with HD as tolerated      LOS: 6 Trinia Georgi 2/26/201711:43 AM

## 2015-08-20 NOTE — Progress Notes (Signed)
Attempted to instruct pt. On use of flutter..the patient. Unable to understand and perform.

## 2015-08-20 NOTE — Progress Notes (Signed)
Name: [name] MRN: [id number] DOB: 04-20-1929    ADMISSION DATE:  08/14/2015 DATE OF CONSULTATION: 02/20  HISTORY OF PRESENT ILLNESS: 50 M with ESRD, prior CVA, prior MI, ESRD on home HD underwent complex angioplasty to LLE. During procedure had problems with hypoxemia, then suffered PEA arrest requiring intubation and 2 mins ACLS.    SUBJECTIVE:  More alert and following commands this morning. Still having lots of copious secretions but able to cough well. SPO2 stable in the high 90s on 2L [place]. On low dose levophed with MAPs in the low 60s.   MAJOR EVENTS/TEST RESULTS: 02/20 Angioplasty to LLE c/b PEA arrest. Admitted to ICU after 2 mins ACLS. Intubated 02/20 LE venous US:  02/20 CT head: NAD 02/20 TTE: LVEF 50-55% 02/22 CT head: NAD 02/23 EEG: This is an abnormal electroencephalogram secondary to general background slowing and the presence of frequent triphasic waves. Triphasic waves are seen most commonly in encephalopathic states 02/25 Extubated  INDWELLING DEVICES:: ETT 02/20 >> 02/25 R femoral CVL 02/20 >>02/25    Microbiology results: MRSA PCR 2/20 >> NEG Blood 02/22 >> NEG Resp 2/22 >> Klebsiella (pansens)  Antimicrobials: Cefepime 2/22 >> 02/25 Cefazolin 02/25 >>   VITAL SIGNS: Temp:  [97.3 F (36.3 C)-98.2 F (36.8 C)] 97.9 F (36.6 C) (02/26 1000) Pulse Rate:  [52-62] 59 (02/26 1200) Resp:  [13-22] 19 (02/26 1200) BP: (91-123)/(42-80) 95/45 mmHg (02/26 1200) SpO2:  [82 %-100 %] 100 % (02/26 1200) HEMODYNAMICS:   VENTILATOR SETTINGS:   INTAKE / OUTPUT:  Intake/Output Summary (Last 24 hours) at 08/20/15 1320 Last data filed at 08/20/15 0900  Gross per 24 hour  Intake 123.49 ml  Output      0 ml  Net 123.49 ml     Physical Examination:   VS: BP 95/45 mmHg  Pulse 59  Temp(Src) 97.9 F (36.6 C) (Axillary)  Resp 19  Ht  (1.753 m)  Wt 212 lb 11.9 oz (96.5 kg)  BMI 31.40 kg/m2  SpO2 100%   General Appearance: Awake, no distress   Neuro: AAO X2; follows basic commands, hearing impaired; no other focal deficits  HEENT: PERRLA, EOM intact. Pulmonary: normal breath sounds, coarse rhonchi in bilateral upper lung fileds   Cardiovascular: RRR, S1/S2, no m/r/g.   Abdomen: Normal bowel sounds, soft, non-tender. MS: No visible joint deformities Skin:   warm, no rashes, no ecchymosis  Extremities: normal, no cyanosis, clubbing.   LABS:   LABORATORY PANEL:   CBC  Recent Labs Lab 08/20/15 0457  WBC 10.8*  HGB 8.0*  HCT 25.0*  PLT 285    Chemistries   Recent Labs Lab 08/14/15 1236  08/20/15 0457  NA  --   < > 139  K  --   < > 4.6  CL  --   < > 94*  CO2  --   < > 28  GLUCOSE  --   < > 106*  BUN  --   < > 105*  CREATININE 8.81*  < > 8.20*  CALCIUM  --   < > 10.0  MG 2.2  --   --   PHOS 7.3*  --   --   AST 34  --   --   ALT 6*  --   --   ALKPHOS 98  --   --   BILITOT 1.2  --   --   < > = values in this interval not displayed.   Recent Labs Lab 08/19/15 1134  08/19/15 1952 08/20/15 0011 08/20/15 0359 08/20/15 0743 08/20/15 1123  GLUCAP 101* 81 83 91 94 87    Recent Labs Lab 08/16/15 0445 08/17/15 0500 08/18/15 0437  PHART 7.43 7.40 7.46*  PCO2ART 43 47 47  PO2ART 141* 114* 92    Recent Labs Lab 08/14/15 1236  AST 34  ALT 6*  ALKPHOS 98  BILITOT 1.2  ALBUMIN 3.0*    Cardiac Enzymes  Recent Labs Lab 08/15/15 0525  TROPONINI 1.60*   CXR 08/19/15: Low volumes, no definite infiltrates  ASSESSMENT / PLAN:  PULMONARY A: VDRF post cardiac arrest s/p extubation 02/25 Passed SBT 02/25 P:  Supplemental O2 to maitnain SpO2 > 90% Goal is not to re-intubate but still awaiting final decision from family. Will need to clarify re-intubation status Flutter valve and incentive spirometry Q1H WA Chest PT  CXR in am  CARDIOVASCULAR A:  PEA Cardiac arrest 02/20 - etiology unclear Hypotension, improving Ischemic CM Severe PVD P:  MAP goal > 60 mmHg Wean the Levophed  as tolerated Start midodrine 5 mg tid with meals  RENAL A:  ESRD on home HD P:  Monitor BMET intermittently Monitor I/Os Correct electrolytes as indicated Dialysis per nephrology  GASTROINTESTINAL A:  No acute issues P:  GI prophylaxis: IV PPI NPO post extubation  HEMATOLOGIC A:  Anemia of chronic disease/CKD P:  DVT px: SQ heparin Monitor CBC intermittently Transfuse per usual guidelines  INFECTIOUS A:  Fever-resolved Possible PNA - Klebsiella in respiratory culture P:  Monitor temp, WBC count Micro and abx as above F/u cultures  ENDOCRINE A:  Hyperglycemia, minimal Risk of hypoglycemia after TFs stopped P:  Monitor for hypoglycemia  NEUROLOGIC A:  Post anoxic encephalopathy - markedly improved Very HOH P:  RASS goal: 0 Minimize sedating medications PT/OT evaluation and treatment   Family update: Family at bedside. Updated on current treatment plan.  Transfer patient to floor on telemetry is Ok with primary team   William Fischer, MD PCCM service Mobile [phone] Pager [phone]

## 2015-08-20 NOTE — Progress Notes (Signed)
Bipap alarm loudness set at 7

## 2015-08-20 NOTE — Progress Notes (Signed)
Mr. Meiring did not get to finish dialysis to to being restless, plan to get dialysis tomorrow. Pt has a lot of thick secretions with a weak cough. Attempted flutter valve. He was on 3l nasal cannula most of the day but became restless. And working to breathe. Spoke with [facility], new order to start bipap if family agrees. Spoke with Mr. Rounsaville son and wife and they both decided that bipap would be fine. Pt is now resting comfortably on bipap. Dressing on bilateral feet was changed. Now dry and intact.

## 2015-08-20 NOTE — Progress Notes (Signed)
[facility]  Daily Progress Note  Subjective: 6 Days Post-Op: Left Lower Extremity Angiogram complicated by PEA arrest during procedure.  Still extubated, on levo, sitting up in bed. Seems more responsive today. Grabbed my hand when asked to. Seen by WOC for sacral and LLE ulceration.   Objective: Filed Vitals:   08/20/15 0800 08/20/15 0804 08/20/15 0900 08/20/15 1000  BP: 115/78  91/49 101/47  Pulse: 58  61 59  Temp:    97.9 F (36.6 C)  TempSrc:    Axillary  Resp: Height:      Weight:      SpO2: 95% 99% 100% 100%    Intake/Output Summary (Last 24 hours) at 08/20/15 1109 Last data filed at 08/20/15 0900  Gross per 24 hour  Intake 123.49 ml  Output      0 ml  Net 123.49 ml   Physical Exam: Alert not oriented to time or place, NAD, Extubated CV: Paced. Pulmonary: CTA Bilaterally Left Upper Extremity Fistula: Good thrill / bruit, palpable radial pulse Abdomen: Soft, Nontender, Nondistended Sacrum: Stage 2 Decub Groin: Left femoral central line without infection Vascular: Left Lower Extremity Ulceration: Stable - wrapped with dressing   Laboratory: CBC    Component Value Date/Time   WBC 10.8* 08/20/2015 0457   WBC 14.6* 01/11/2014 1412   HGB 8.0* 08/20/2015 0457   HGB 12.2* 01/11/2014 1412   HCT 25.0* 08/20/2015 0457   HCT 38.4* 01/11/2014 1412   PLT 285 08/20/2015 0457   PLT 203 01/11/2014 1412   BMET    Component Value Date/Time   NA 139 08/20/2015 0457   NA 140 01/11/2014 1412   K 4.6 08/20/2015 0457   K 5.4* 01/11/2014 1412   CL 94* 08/20/2015 0457   CL 101 01/11/2014 1412   CO2 28 08/20/2015 0457   CO2 28 01/11/2014 1412   GLUCOSE 106* 08/20/2015 0457   GLUCOSE 131* 01/11/2014 1412   BUN 105* 08/20/2015 0457   BUN 73* 01/11/2014 1412   CREATININE 8.20* 08/20/2015 0457   CREATININE 7.96* 01/11/2014 1412   CALCIUM 10.0 08/20/2015 0457   CALCIUM 8.2* 01/11/2014 1412   GFRNONAA 5* 08/20/2015 0457   GFRNONAA 6* 01/11/2014 1412   GFRAA 6* 08/20/2015 0457   GFRAA 7* 01/11/2014 1412   Assessment/Planning: 6 Days Post-Op: Left Lower Extremity Angiogram complicated by PEA arrest during procedure. 1) Extubated - doing well, more responsive today, still on levo for cardiogenic shock 2) SLP consult - awaiting recommendations 3) Seen by WOC for sacral and LLE ulceration - appreciate recommendations. Local wound care for both - unsure about further intervention at this time in regard to LLE ulceration / repeat angiogram - will discuss with Dr. Wyn Quaker. 4) Dialysis tomorrow  Cleda Daub PA-C 08/20/2015 11:09 AM

## 2015-08-20 NOTE — Progress Notes (Signed)
PT Cancellation Note  Patient Details Name: [name] MRN: [id number] DOB: Nov 02, 1928   Cancelled Treatment:    Reason Eval/Treat Not Completed: Patient at procedure or test/unavailable  Stopped by pt's CCU room, he was receiving dialysis, will try for PT exam tomorrow.  Loran Senters, PT, DPT [phone]  Malachi Pro 08/20/2015, 4:33 PM

## 2015-08-20 NOTE — Progress Notes (Signed)
[facility] - [place] at [employer]   PATIENT NAME: [name]    MR#:  [id number]  DATE OF BIRTH:  [birth year]  SUBJECTIVE:  Now extubated, seems confused lots of congestion  REVIEW OF SYSTEMS:  Unable to obtain patient's mental status/medical condition  DRUG ALLERGIES:   Allergies  Allergen Reactions  . Penicillins Swelling    VITALS:  Blood pressure 90/42, pulse 52, temperature 97.9 F (36.6 C), temperature source Axillary, resp. rate 18, height  (1.753 m), weight 96.5 kg (212 lb 11.9 oz), SpO2 96 %.  PHYSICAL EXAMINATION:   VITAL SIGNS: Filed Vitals:   08/20/15 1300 08/20/15 1400  BP: 96/40 90/42  Pulse: 59 52  Temp:    Resp: 17 18   GENERAL:82 y.o.male currently in no acute distress.  chronically ill  HEAD: Normocephalic, atraumatic.  EYES: Pupils equal, round, reactive to light. Extraocular muscles intact. No scleral icterus.  MOUTH: Moist mucosal membrane. Dentition intact. No abscess noted.  EAR, NOSE, THROAT: Clear without exudates. No external lesions.  NECK: Supple. No thyromegaly. No nodules. No JVD.  PULMONARY: diffuse coarse breath sounds without wheeze rails or rhonci. No use of accessory muscles, Good respiratory effort. good air entry bilaterally CHEST: Nontender to palpation.  CARDIOVASCULAR: S1 and S2. Regular rate and rhythm. No murmurs, rubs, or gallop1+dema. Pedal pulses 2+ bilaterally.  GASTROINTESTINAL: Soft, nontender, nondistended. No masses. Positive bowel sounds. No hepatosplenomegaly.  MUSCULOSKELETAL: No swelling, clubbing, or edema. Range of motion full in all extremities.  NEUROLOGIC: spontaneous movement all extremities unable to fully assess secondary to patient's mental status medical condition.  SKIN: No ulceration, lesions, rashes, or cyanosis. Skin warm and dry. Turgor intact.  PSYCHIATRIC: The patient is awake       LABORATORY PANEL:   CBC  Recent Labs Lab 08/20/15 0457  WBC 10.8*  HGB  8.0*  HCT 25.0*  PLT 285   ------------------------------------------------------------------------------------------------------------------  Chemistries   Recent Labs Lab 08/14/15 1236  08/20/15 0457  NA  --   < > 139  K  --   < > 4.6  CL  --   < > 94*  CO2  --   < > 28  GLUCOSE  --   < > 106*  BUN  --   < > 105*  CREATININE 8.81*  < > 8.20*  CALCIUM  --   < > 10.0  MG 2.2  --   --   AST 34  --   --   ALT 6*  --   --   ALKPHOS 98  --   --   BILITOT 1.2  --   --   < > = values in this interval not displayed. ------------------------------------------------------------------------------------------------------------------  Cardiac Enzymes  Recent Labs Lab 08/15/15 0525  TROPONINI 1.60*   ------------------------------------------------------------------------------------------------------------------  RADIOLOGY:  Dg Chest 1 View  08/19/2015  CLINICAL DATA:  82 year old male with respiratory failure. EXAM: CHEST 1 VIEW COMPARISON:  08/18/2015 FINDINGS: Endotracheal tube with tip 2.1 cm above the carina, NG tube entering the stomach with tip off field of view and left-sided pacemaker again noted. This is a low volume film. Cardiomegaly and mild pulmonary vascular congestion again noted. There is no evidence of pneumothorax. IMPRESSION: Low volume film with support apparatus as described, cardiomegaly and mild pulmonary vascular congestion. Electronically Signed   By: Harmon Pier M.D.   On: 08/19/2015 08:00   Dg Abd Portable 1v  08/18/2015  CLINICAL DATA:  NG tube placement EXAM: PORTABLE ABDOMEN -  1 VIEW COMPARISON:  08/15/2015 FINDINGS: NG tube tip is in the distal stomach. Gas within mildly prominent transverse colon. IMPRESSION: NG tube tip in the distal stomach. Electronically Signed   By: Charlett Nose M.D.   On: 08/18/2015 18:52    EKG:   Orders placed or performed during the hospital encounter of 08/14/15  . EKG 12-Lead  . EKG 12-Lead    ASSESSMENT AND PLAN:    82 year old gentleman admitted 2/20/17post arrest doing a vascular procedure     1. Acute cardiac arrest. PEA arrest.  2. Cardiogenic shockRemains on low-dose Levophed  3. End-stage renal disease on hemodialysis. Nephrology to continue with dialysis. 4. Type 2 diabetes- sliding-scale insulin for now 5. History of chronic systolic heart failure. 6. Peripheral vascular disease with ulcer left medial malleolus. Local wound care. 7. Anemia of chronic disease on Epogen with dialysis 8. Nutrition tube feeding to be started     All the records are reviewed and case discussed with Care Management/Social Workerr. Management plans discussed with the patient, family and they are in agreement.  CODE STATUS: dnr  TOTAL TIME TAKING CARE OF THIS PATIENT: 28 minutes.   POSSIBLE D/C IN 2-3 DAYS, DEPENDING ON CLINICAL CONDITION.   Hower,  Mardi Mainland.D on 08/20/2015 at 2:09 PM  Between 7am to 6pm - Pager - [phone]  After 6pm: House Pager: - [phone]  [facility]  Office  [phone]  CC: Primary care physician; Lauro Regulus., MD

## 2015-08-20 NOTE — Progress Notes (Signed)
NTS pt. For large amounts of thick white secretion,, Pt. Tol. Well.Marland KitchenHR 75 , sats. 97. rr 17

## 2015-08-21 ENCOUNTER — Inpatient Hospital Stay: Payer: Medicare Other

## 2015-08-21 DIAGNOSIS — J150 Pneumonia due to Klebsiella pneumoniae: Secondary | ICD-10-CM

## 2015-08-21 LAB — CBC
HEMATOCRIT: 23.6 % — AB (ref 40.0–52.0)
Hemoglobin: 7.6 g/dL — ABNORMAL LOW (ref 13.0–18.0)
MCH: 29.5 pg (ref 26.0–34.0)
MCHC: 32.4 g/dL (ref 32.0–36.0)
MCV: 91.1 fL (ref 80.0–100.0)
Platelets: 260 10*3/uL (ref 150–440)
RBC: 2.59 MIL/uL — ABNORMAL LOW (ref 4.40–5.90)
RDW: 16.5 % — AB (ref 11.5–14.5)
WBC: 8.8 10*3/uL (ref 3.8–10.6)

## 2015-08-21 LAB — BASIC METABOLIC PANEL
Anion gap: 18 — ABNORMAL HIGH (ref 5–15)
BUN: 90 mg/dL — AB (ref 6–20)
CHLORIDE: 94 mmol/L — AB (ref 101–111)
CO2: 26 mmol/L (ref 22–32)
Calcium: 9.4 mg/dL (ref 8.9–10.3)
Creatinine, Ser: 8.11 mg/dL — ABNORMAL HIGH (ref 0.61–1.24)
GFR calc Af Amer: 6 mL/min — ABNORMAL LOW (ref >60)
GFR calc non Af Amer: 5 mL/min — ABNORMAL LOW (ref >60)
GLUCOSE: 94 mg/dL (ref 65–99)
POTASSIUM: 4.2 mmol/L (ref 3.5–5.1)
Sodium: 138 mmol/L (ref 135–145)

## 2015-08-21 LAB — MAGNESIUM: Magnesium: 2.4 mg/dL (ref 1.7–2.4)

## 2015-08-21 LAB — BLOOD GAS, ARTERIAL
ALLENS TEST (PASS/FAIL): POSITIVE — AB
Acid-Base Excess: 1.9 mmol/L (ref 0.0–3.0)
Bicarbonate: 27.7 mEq/L (ref 21.0–28.0)
DELIVERY SYSTEMS: POSITIVE
EXPIRATORY PAP: 6
FIO2: 0.4
Inspiratory PAP: 12
O2 Saturation: 97.2 %
PCO2 ART: 49 mmHg — AB (ref 32.0–48.0)
PH ART: 7.36 (ref 7.350–7.450)
Patient temperature: 37
pO2, Arterial: 96 mmHg (ref 83.0–108.0)

## 2015-08-21 LAB — GLUCOSE, CAPILLARY: Glucose-Capillary: 92 mg/dL (ref 65–99)

## 2015-08-21 LAB — PHOSPHORUS: Phosphorus: 8.1 mg/dL — ABNORMAL HIGH (ref 2.5–4.6)

## 2015-08-21 MED ORDER — CEFAZOLIN SODIUM-DEXTROSE 2-3 GM-% IV SOLR
2.0000 g | INTRAVENOUS | Status: AC
  Administered 2015-08-23: 2 g via INTRAVENOUS
  Filled 2015-08-21: qty 50

## 2015-08-21 MED ORDER — PRAVASTATIN SODIUM 20 MG PO TABS
20.0000 mg | ORAL_TABLET | Freq: Every day | ORAL | Status: DC
  Administered 2015-08-21 – 2015-08-24 (×3): 20 mg via ORAL
  Filled 2015-08-21 (×4): qty 1

## 2015-08-21 MED ORDER — MORPHINE SULFATE (PF) 2 MG/ML IV SOLN
1.0000 mg | INTRAVENOUS | Status: DC | PRN
Start: 2015-08-21 — End: 2015-08-25
  Administered 2015-08-22 – 2015-08-25 (×3): 2 mg via INTRAVENOUS
  Filled 2015-08-21 (×3): qty 1

## 2015-08-21 MED ORDER — CINACALCET HCL 30 MG PO TABS
30.0000 mg | ORAL_TABLET | Freq: Every day | ORAL | Status: DC
  Administered 2015-08-22 – 2015-08-24 (×3): 30 mg via ORAL
  Filled 2015-08-21 (×3): qty 1

## 2015-08-21 MED ORDER — NOREPINEPHRINE BITARTRATE 1 MG/ML IV SOLN
0.0000 ug/min | INTRAVENOUS | Status: AC
  Administered 2015-08-21: 1 ug/min via INTRAVENOUS
  Filled 2015-08-21: qty 4

## 2015-08-21 MED ORDER — CARBIDOPA-LEVODOPA 25-100 MG PO TABS
1.0000 | ORAL_TABLET | Freq: Two times a day (BID) | ORAL | Status: DC
  Administered 2015-08-21 – 2015-08-24 (×6): 1 via ORAL
  Filled 2015-08-21 (×7): qty 1

## 2015-08-21 MED ORDER — ASPIRIN EC 325 MG PO TBEC
325.0000 mg | DELAYED_RELEASE_TABLET | Freq: Two times a day (BID) | ORAL | Status: DC
  Administered 2015-08-21 – 2015-08-22 (×3): 325 mg via ORAL
  Filled 2015-08-21 (×3): qty 1

## 2015-08-21 MED ORDER — CEFAZOLIN SODIUM-DEXTROSE 2-3 GM-% IV SOLR
2.0000 g | Freq: Once | INTRAVENOUS | Status: AC
  Administered 2015-08-21: 2 g via INTRAVENOUS
  Filled 2015-08-21: qty 50

## 2015-08-21 NOTE — Evaluation (Signed)
Clinical/Bedside Swallow Evaluation Patient Details  Name: [name] MRN: [id number] Date of Birth: January 16, 1929  Today's Date: 08/21/2015 Time: SLP Start Time (ACUTE ONLY): 1450 SLP Stop Time (ACUTE ONLY): 1550 SLP Time Calculation (min) (ACUTE ONLY): 60 min  Past Medical History:  Past Medical History  Diagnosis Date  . Renal disorder   . Stroke (HCC)   . Coronary artery disease   . Myocardial infarction (HCC)   . Parkinson disease (HCC)   . Chronic home hemodialysis status [facility])    Past Surgical History:  Past Surgical History  Procedure Laterality Date  . Pacemaker insertion    . Orif ankle fracture Left 05/31/2015    Procedure: OPEN REDUCTION INTERNAL FIXATION TRIMALLEOLAR IANKLE FRACTURE DISLOCATION;  Surgeon: Christena Flake, MD;  Location: [facility] ORS;  Service: Orthopedics;  Laterality: Left;  . Peripheral vascular catheterization N/A 07/27/2015    Procedure: A/V Shuntogram/Fistulagram;  Surgeon: Annice Needy, MD;  Location: [facility] INVASIVE CV LAB;  Service: Cardiovascular;  Laterality: N/A;  . Peripheral vascular catheterization N/A 07/27/2015    Procedure: A/V Shunt Intervention;  Surgeon: Annice Needy, MD;  Location: [facility] INVASIVE CV LAB;  Service: Cardiovascular;  Laterality: N/A;  . Peripheral vascular catheterization N/A 08/14/2015    Procedure: Abdominal Aortogram w/Lower Extremity;  Surgeon: Annice Needy, MD;  Location: [facility] INVASIVE CV LAB;  Service: Cardiovascular;  Laterality: N/A;  . Peripheral vascular catheterization  08/14/2015    Procedure: Lower Extremity Intervention;  Surgeon: Annice Needy, MD;  Location: [facility] INVASIVE CV LAB;  Service: Cardiovascular;;   HPI:  Pt is a 82 y.o. male with a known history of Stroke, MI, CAD, end-stage renal disease on hemodialysis, PAD and Parkinson's disease who presented for elective angiogram. According to the nurse patient had wheezing and cough upon arrival. He was placed on a nonrebreather due to low saturations. Near the end of the  case, O2 sats are decreasing. Patient became unresponsive and was immediately bad. Patient was in PEA arrest. He was intubated, epinephrine given and CPR was started. After 2 minutes patient regained pulse and dopamine infusion was started. Pt was orally intubated then extubated on 08/19/15. He presents w/ low BP(meds); drowsiness, and requires cues to continue to attend to task during this session. Pt was nonverbal and did not attempt to communicate w/ SLP. Pt has been NPO since extubation x2 days ago but has required BiPAP for respiratory support since that time. Currently on [place] O2 support. NSG and other disciplines have noted lethargy and difficulty arousing pt for follow through w/ tasks/therapy. Pt has only been able to give his name. Noted expiratory wheeze during respirations at rest.    Assessment / Plan / Recommendation Clinical Impression  Pt appears at increased risk for aspiration sec. to his presentation w/ po trials during BSE. Pt exhibited oral phase deficits c/b oral holding, delayed A-P transfer, and reduced lingual manipulation for bolus management. Oral holding noted moreso w/ the trials of Honey consistency liquids; less noted w/ trials of puree (suspect increased texture was a positive factor for pt). Suspect delayed haryngeal swallow w/ trials; audible swallow was noted and min. effort was noted at times. Pt's respiratory status was c/b the same expiratory wheezing noted prior to po's given. Pt required verbal/tactile cues to encourage participation in the task of po trial/swallowing. It seemed that SLP was working harder than the pt did during the task; NSG agreed it was same for her. Rec. pt remain NPO at this time d/t  pt's presentation and decreased orientation to the task of swallowing. ST will f/u tomorrow w/ further trial assessment to hopefully establish an oral diet of least restrictiveness. NSG updated and agreed.     Aspiration Risk  Moderate aspiration risk-Severe aspiration  risk    Diet Recommendation  NPO w/ continued assessment w/ po trials to hopefully establish least restrictive diet consistency  Medication Administration: Crushed with puree    Other  Recommendations Recommended Consults:  (dietician) Oral Care Recommendations: Oral care QID;Staff/trained caregiver to provide oral care (while NPO) Other Recommendations:  (TBD)   Follow up Recommendations  Skilled Nursing facility (TBD)    Frequency and Duration min 3x week  2 weeks       Prognosis Prognosis for Safe Diet Advancement: Guarded Barriers to Reach Goals: Cognitive deficits;Severity of deficits Barriers/Prognosis Comment: Parkinson's Dis.      Swallow Study   General Date of Onset: 08/14/15 HPI: Pt is a 82 y.o. male with a known history of Stroke, MI, CAD, end-stage renal disease on hemodialysis, PAD and Parkinson's disease who presented for elective angiogram. According to the nurse patient had wheezing and cough upon arrival. He was placed on a nonrebreather due to low saturations. Near the end of the case, O2 sats are decreasing. Patient became unresponsive and was immediately bad. Patient was in PEA arrest. He was intubated, epinephrine given and CPR was started. After 2 minutes patient regained pulse and dopamine infusion was started. Pt was orally intubated then extubated on 08/19/15. He presents w/ low BP(meds); drowsiness, and requires cues to continue to attend to task during this session. Pt was nonverbal and did not attempt to communicate w/ SLP. Pt has been NPO since extubation x2 days ago but has required BiPAP for respiratory support since that time. Currently on [place] O2 support. NSG and other disciplines have noted lethargy and difficulty arousing pt for follow through w/ tasks/therapy. Pt has only been able to give his name. Noted expiratory wheeze during respirations at rest.  Type of Study: Bedside Swallow Evaluation Previous Swallow Assessment: unknown Diet Prior to this  Study:  (unknown) Temperature Spikes Noted: No (wbc 8.8) Respiratory Status: Nasal cannula (2 liters) History of Recent Intubation: Yes Length of Intubations (days): 6 days Date extubated: 08/19/15 Behavior/Cognition: Confused;Requires cueing;Lethargic/Drowsy;Doesn't follow directions (awake w/ cues) Oral Cavity Assessment:  (unable to fully assess sec. to cognitive status) Oral Care Completed by SLP: Yes (attempted in light of cognitive status) Oral Cavity - Dentition:  (has dentition) Vision:  (n/a) Self-Feeding Abilities: Total assist Patient Positioning: Upright in bed Baseline Vocal Quality:  (nonverbal during eval) Volitional Cough: Cognitively unable to elicit Volitional Swallow: Unable to elicit    Oral/Motor/Sensory Function Overall Oral Motor/Sensory Function:  (unable to assess sec. to declined Cognitive status)   Ice Chips Ice chips: Impaired Presentation: Spoon (fed; 3 trials) Oral Phase Impairments: Poor awareness of bolus;Reduced lingual movement/coordination Oral Phase Functional Implications: Oral holding;Prolonged oral transit (decreased lingual movements for bolus manipulation) Pharyngeal Phase Impairments: Suspected delayed Swallow (did not immediately swallow) Other Comments: verbal/tactile cues were given to encourage a pharyngeal swallow   Thin Liquid Thin Liquid: Not tested    Nectar Thick Nectar Thick Liquid: Not tested   Honey Thick Honey Thick Liquid: Impaired Presentation: Spoon (fed; 5 trials) Oral Phase Impairments: Poor awareness of bolus;Reduced lingual movement/coordination Oral Phase Functional Implications: Prolonged oral transit;Oral holding Pharyngeal Phase Impairments: Suspected delayed Swallow (no immediate swallowing w/ bolus trials) Other Comments: verbal/tactile cues were given to encourage a  pharyngeal swallow   Puree Puree: Impaired Presentation: Spoon (fed; 6 trials) Oral Phase Impairments: Reduced lingual movement/coordination;Poor  awareness of bolus (increased oral phase time) Oral Phase Functional Implications: Prolonged oral transit;Oral holding (intermittently) Pharyngeal Phase Impairments: Suspected delayed Swallow Other Comments: verbal/tactile cues were given to encourage a pharyngeal swallow   Solid   GO   Solid: Not tested       Jerilynn Som, MS, CCC-SLP  [name] 08/21/2015,4:36 PM

## 2015-08-21 NOTE — Progress Notes (Signed)
Pt. On Bipap all night, RT weaned to 28%.  Pt. Given morphine PRN a couple of times throughout the night to manage comfort.  Pt. More mobile in bed, but still only intermittently following commands- alert to self. Titrated levo gtt very slowly as BP reactive to even slight changes. Pt.'s LBM: 2/22, on bowel regimen, has been NPO since 2/25- SLP eval expected today. Changed wound dsg's per [facility] nurse orders. AM labs: hgb fell to 7.6, reported that finding to Dr. Sheryle Hail- no new orders at this time Gave report to [place], [place]

## 2015-08-21 NOTE — Progress Notes (Signed)
[facility] Physicians - [place] at [facility]   PATIENT NAME: [name]    MR#:  [id number]  DATE OF BIRTH:  January 13, 1929  SUBJECTIVE:  Remains extubated Confusion and agitation overnight Resting comfortably this morning  REVIEW OF SYSTEMS:  Unable to obtain patient's mental status/medical condition  DRUG ALLERGIES:   Allergies  Allergen Reactions  . Penicillins Swelling    VITALS:  Blood pressure 95/54, pulse 31, temperature 97.4 F (36.3 C), temperature source Oral, resp. rate 13, height  (1.753 m), weight 96.5 kg (212 lb 11.9 oz), SpO2 93 %.  PHYSICAL EXAMINATION:   VITAL SIGNS: Filed Vitals:   08/21/15 1100 08/21/15 1200  BP: 93/40 95/54  Pulse: 69 31  Temp:    Resp: 17 13   GENERAL:82 y.o.male currently in no acute distress.  chronically ill  HEAD: Normocephalic, atraumatic.  EYES: Pupils equal, round, reactive to light. Extraocular muscles intact. No scleral icterus.  MOUTH: Moist mucosal membrane. Dentition intact. No abscess noted.  EAR, NOSE, THROAT: Clear without exudates. No external lesions.  NECK: Supple. No thyromegaly. No nodules. No JVD.  PULMONARY: diffuse coarse breath sounds without wheeze rails or rhonci. No use of accessory muscles, Good respiratory effort. good air entry bilaterally CHEST: Nontender to palpation.  CARDIOVASCULAR: S1 and S2. Regular rate and rhythm. No murmurs, rubs, or gallop1+dema. Pedal pulses 2+ bilaterally.  GASTROINTESTINAL: Soft, nontender, nondistended. No masses. Positive bowel sounds. No hepatosplenomegaly.  MUSCULOSKELETAL: No swelling, clubbing, or edema. Range of motion full in all extremities.  NEUROLOGIC: spontaneous movement all extremities unable to fully assess secondary to patient's mental status medical condition.  SKIN: No ulceration, lesions, rashes, or cyanosis. Skin warm and dry. Turgor intact.  PSYCHIATRIC: The patient is awake       LABORATORY PANEL:   CBC  Recent Labs Lab  08/21/15 0514  WBC 8.8  HGB 7.6*  HCT 23.6*  PLT 260   ------------------------------------------------------------------------------------------------------------------  Chemistries   Recent Labs Lab 08/21/15 0514  NA 138  K 4.2  CL 94*  CO2 26  GLUCOSE 94  BUN 90*  CREATININE 8.11*  CALCIUM 9.4  MG 2.4   ------------------------------------------------------------------------------------------------------------------  Cardiac Enzymes  Recent Labs Lab 08/15/15 0525  TROPONINI 1.60*   ------------------------------------------------------------------------------------------------------------------  RADIOLOGY:  Dg Chest Port 1 View  08/21/2015  CLINICAL DATA:  Respiratory failure EXAM: PORTABLE CHEST 1 VIEW COMPARISON:  August 19, 2015 FINDINGS: Endotracheal tube has been removed. No pneumothorax. There is mild bibasilar atelectasis. No new opacity. Heart is mildly enlarged with pulmonary vascularity within normal limits. No adenopathy. Pacemaker leads are attached to the right atrium and right ventricle. IMPRESSION: No pneumothorax. Mild bibasilar atelectasis. No frank edema or consolidation. Stable cardiomegaly. Electronically Signed   By: Bretta Bang III M.D.   On: 08/21/2015 07:27    EKG:   Orders placed or performed during the hospital encounter of 08/14/15  . EKG 12-Lead  . EKG 12-Lead    ASSESSMENT AND PLAN:   82 year old gentleman admitted 2/20/17post arrest doing a vascular procedure     1. Acute cardiac arrest. PEA arrest.  2. Cardiogenic shockRemains on low-dose Levophed and wean as tolerated  3. End-stage renal disease on hemodialysis. Nephrology to continue with dialysis. 4. Type 2 diabetes- sliding-scale insulin for now 5. History of chronic systolic heart failure. 6. Peripheral vascular disease with ulcer left medial malleolus. Local wound care. 7. Anemia of chronic disease on Epogen with dialysis 8. Nutrition tube feeding to be  started  All the records are reviewed and case discussed with Care Management/Social Workerr. Management plans discussed with the patient, family and they are in agreement.  CODE STATUS: dnr  TOTAL TIME TAKING CARE OF THIS PATIENT: 28 minutes.   POSSIBLE D/C IN 2-3 DAYS, DEPENDING ON CLINICAL CONDITION.   Hower,  Mardi Mainland.D on 08/21/2015 at 2:06 PM  Between 7am to 6pm - Pager - [phone]  After 6pm: House Pager: - [phone]  [facility]  Office  [phone]  CC: Primary care physician; Lauro Regulus., MD

## 2015-08-21 NOTE — Progress Notes (Signed)
PT Hold Note  Patient Details Name: [name] MRN: [id number] DOB: 11/12/1928   Cancelled Treatment:    Reason Eval/Treat Not Completed: Medical issues which prohibited therapy. Chart reviewed and RN consulted. Pt currently getting ABG drawn. RN requests hold PT until after ABG results have returned. RN to contact PT when appropriate. Will perform PT evaluation when pt is available/appropriate.  Sharalyn Ink Huprich PT, DPT   Huprich,Jason 08/21/2015, 10:03 AM

## 2015-08-21 NOTE — Care Management (Signed)
Patient was extubated 2/25.  2/26, patient became restless with increased work of breathing and placed on bipap.  He remains on Levophed.

## 2015-08-21 NOTE — Progress Notes (Signed)
PT Cancellation Note  Patient Details Name: [name] MRN: [id number] DOB: [birth year]   Cancelled Treatment:    Reason Eval/Treat Not Completed: Patient's level of consciousness. Chart reviewed and RN cleared patient for participation. Attempted to work with patient however very difficult to arouse. Once pt is awake he continues to close eyes. Able to provide name but no further conversation at this time. Unable to follow commands. Will continue to attempt PT evaluation on later date/time.  Sharalyn Ink Fabiana Dromgoole PT, DPT   Jentzen Minasyan 08/21/2015, 2:15 PM

## 2015-08-21 NOTE — Progress Notes (Signed)
Name: [name] MRN: [id number] DOB: Jun 07, 1929    ADMISSION DATE:  08/14/2015 DATE OF CONSULTATION: 02/20  HISTORY OF PRESENT ILLNESS: 82 y/o Male  with ESRD, prior CVA, prior MI, ESRD on home HD underwent complex angioplasty to LLE. During procedure had problems with hypoxemia, then suffered PEA arrest requiring intubation and 2 mins ACLS.   SUBJECTIVE: More lethargic this morning. On BiPAP all night. Had dialysis yesterday but didn't finish due to restlessness and lots of thick secretions. Still on a low dose of levophed; mild hypotension with MAPs 58-65.   MAJOR EVENTS/TEST RESULTS: 02/20 Angioplasty to LLE c/b PEA arrest. Admitted to ICU after 2 mins ACLS. Intubated 02/20 LE venous US:  02/20 CT head: NAD 02/20 TTE: LVEF 50-55% 02/22 CT head: NAD 02/23 EEG: This is an abnormal electroencephalogram secondary to general background slowing and the presence of frequent triphasic waves. Triphasic waves are seen most commonly in encephalopathic states 02/25 Extubated  INDWELLING DEVICES:: ETT 02/20 >> 02/25 R femoral CVL 02/20 >>02/26    Microbiology results: MRSA PCR 2/20 >> NEG Blood 02/22 >> NEG Resp 2/22 >> Klebsiella (pansens)  Antimicrobials: Cefepime 2/22 >> 02/25 Cefazolin 02/25 >>   VITAL SIGNS: Temp:  [97 F (36.1 C)-97.9 F (36.6 C)] 97.3 F (36.3 C) (02/27 0400) Pulse Rate:  [52-72] 59 (02/27 0630) Resp:  [12-20] 13 (02/27 0630) BP: (66-134)/(35-92) 103/42 mmHg (02/27 0630) SpO2:  [94 %-100 %] 100 % (02/27 0853) FiO2 (%):  [28 %-45 %] 28 % (02/27 0853) HEMODYNAMICS:   VENTILATOR SETTINGS: Vent Mode:  [-]  FiO2 (%):  [28 %-45 %] 28 % INTAKE / OUTPUT:  Intake/Output Summary (Last 24 hours) at 08/21/15 0906 Last data filed at 08/21/15 0600  Gross per 24 hour  Intake  76.34 ml  Output   -313 ml  Net 389.34 ml     Physical Examination:   VS: BP 103/42 mmHg  Pulse 59  Temp(Src) 97.3 F (36.3 C) (Axillary)  Resp 13  Ht 5' 9" (1.753 m)  Wt  212 lb 11.9 oz (96.5 kg)  BMI 31.40 kg/m2  SpO2 100%   General Appearance: Lethargic, BiPAP mask, no distress  Neuro: Awakens to noxious stimulus; moves all extremities to pain  HEENT: PERRLA, ORAL MUCOSA DRY AND PINK Pulmonary: Diminished breath sounds, coarse rhonchi in bilateral upper lung fileds   Cardiovascular: RRR, S1/S2, no m/r/g.   Abdomen: Normal bowel sounds, soft, non-tender. MS: No visible joint deformities Skin:   warm, no rashes, no ecchymosis  Extremities: normal, no cyanosis, clubbing.   LABS:   LABORATORY PANEL:   CBC  Recent Labs Lab 08/21/15 0514  WBC 8.8  HGB 7.6*  HCT 23.6*  PLT 260    Chemistries   Recent Labs Lab 08/14/15 1236  08/21/15 0514  NA  --   < > 138  K  --   < > 4.2  CL  --   < > 94*  CO2  --   < > 26  GLUCOSE  --   < > 94  BUN  --   < > 90*  CREATININE 8.81*  < > 8.11*  CALCIUM  --   < > 9.4  MG 2.2  --  2.4  PHOS 7.3*  --  8.1*  AST 34  --   --   ALT 6*  --   --   ALKPHOS 98  --   --   BILITOT 1.2  --   --   < > =  values in this interval not displayed.   Recent Labs Lab 08/19/15 1134 08/19/15 1952 08/20/15 0011 08/20/15 0359 08/20/15 0743 08/20/15 1123  GLUCAP 101* 81 83 91 94 87    Recent Labs Lab 08/16/15 0445 08/17/15 0500 08/18/15 0437  PHART 7.43 7.40 7.46*  PCO2ART 43 47 47  PO2ART 141* 114* 92    Recent Labs Lab 08/14/15 1236  AST 34  ALT 6*  ALKPHOS 98  BILITOT 1.2  ALBUMIN 3.0*    Cardiac Enzymes  Recent Labs Lab 08/15/15 0525  TROPONINI 1.60*   CXR 08/19/15: Low volumes, no definite infiltrates  ASSESSMENT / PLAN:  PULMONARY A: VDRF post cardiac arrest s/p extubation 02/25 Passed SBT 02/25 P:  Supplemental O2 to maintain SpO2 > 90% Goal is not to re-intubate but still awaiting final decision from family. Will need to clarify re-intubation status Flutter valve and incentive spirometry Q1H WA Chest PT  BiPAP at hs STAT ABG-Reviewed>nornal  CARDIOVASCULAR A:   PEA Cardiac arrest 02/20 - etiology unclear Hypotension, improving Ischemic CM Severe PVD P:  MAP goal > 60 mmHg Wean off the Levophed as tolerated Continue midodrine 5 mg tid with meals  RENAL A:  ESRD on home HD P:  Monitor BMET intermittently Monitor I/Os Correct electrolytes as indicated Dialysis M-W-F per nephrology  GASTROINTESTINAL A:  No acute issues P:  GI prophylaxis: IV PPI NPO until swallow evaluation is done and then progress diet as tolerated  HEMATOLOGIC A:  Anemia of chronic disease/CKD-Hg trending down P:  DVT px: SQ heparin Monitor CBC intermittently Transfuse per usual guidelines  INFECTIOUS A:  Fever-resolved Possible PNA - Klebsiella in respiratory culture P:  Monitor temp, WBC count Micro and abx as above Continue Cefazolin for klebsiella pneumonia  ENDOCRINE A:  Hyperglycemia, minimal Risk of hypoglycemia after TFs stopped P:  Monitor for hypoglycemia  NEUROLOGIC A:  Post anoxic encephalopathy - markedly improved Very HOH P:  RASS goal: 0 Minimize sedating medications PT/OT evaluation and treatment   Keep in ICU till after next dialysis session due to hypotension. If BP stable after dialysis, transfer to floor.   Family update: Met with patient's family at bedside to clarify intubation status. Both son and wife indicated that they do not want patient intubated. All questions answered and support provided.    Critical care time spent titrating BiPAP, reviewing history and labs, CXR, and ABG interpretation is 35 minutes  Magdalene S. Tukov ANP-BC Pulmonary and Critical Care Medicine [facility] Pager [phone]     STAFF NOTE: I, Dr. Corrin Parker,  have personally reviewed patient's available data, including medical history, events of note, physical examination and test results as part of my evaluation. I have discussed with NP and other care providers such as pharmacist, RN and RRT.   In addition,  I personally evaluated patient and elicited key findings  S:  ADMITTED TO ICU FOR ACUTE RESP FAILURE ON BIPAP UNABLE TO PROVIDE COMPLETE ROS THIS AM  O:  GEN-LETHARGIC HEENT-PERLA CVS-S1 S2 NO MURMURS LUNGS-CTA B/L DMINISHED BS ABD-DISTENDED MSK-NO EDEMA   Recent Labs CBC Latest Ref Rng 08/21/2015 08/20/2015 08/19/2015  WBC 3.8 - 10.6 K/uL 8.8 10.8(H) 14.1(H)  Hemoglobin 13.0 - 18.0 g/dL 7.6(L) 8.0(L) 8.0(L)  Hematocrit 40.0 - 52.0 % 23.6(L) 25.0(L) 25.3(L)  Platelets 150 - 440 K/uL 260 285 307      Recent Labs BMP Latest Ref Rng 08/21/2015 08/20/2015 08/19/2015  Glucose 65 - 99 mg/dL 94 106(H) 126(H)  BUN 6 - 20  mg/dL 90(H) 105(H) 84(H)  Creatinine 0.61 - 1.24 mg/dL 8.11(H) 8.20(H) 6.96(H)  Sodium 135 - 145 mmol/L 138 139 134(L)  Potassium 3.5 - 5.1 mmol/L 4.2 4.6 3.9  Chloride 101 - 111 mmol/L 94(L) 94(L) 92(L)  CO2 22 - 32 mmol/L _0 Calcium 8.9 - 10.3 mg/dL 9.4 10.0 9.2       (The following images and results were reviewed by Dr. Mortimer Fries on 08/21/2015). Dg Chest Port 1 View  08/21/2015  CLINICAL DATA:  Respiratory failure EXAM: PORTABLE CHEST 1 VIEW COMPARISON:  August 19, 2015 FINDINGS: Endotracheal tube has been removed. No pneumothorax. There is mild bibasilar atelectasis. No new opacity. Heart is mildly enlarged with pulmonary vascularity within normal limits. No adenopathy. Pacemaker leads are attached to the right atrium and right ventricle. IMPRESSION: No pneumothorax. Mild bibasilar atelectasis. No frank edema or consolidation. Stable cardiomegaly. Electronically Signed   By: Lowella Grip III M.D.   On: 08/21/2015 07:27      A:RESP FAILURE FROM ACUTE PNUEMONIA  P: CONT IV ABX AS PRESCRIBED WEAN OFF BIPAP IF TOLERATED, FOLLOW UP ABG AS NEEDED, WILL NEED TO ADDRESS DNI STATUS WITH FAMILY    The Rest per NP whose note is outlined above and that I agree with  I have personally reviewed/obtained a history, examined the patient, evaluated  Pertinent laboratory and RadioGraphic/imaging results, and  formulated the assessment and plan   The Patient requires high complexity decision making for assessment and support, frequent evaluation and titration of therapies, application of advanced monitoring technologies and extensive interpretation of multiple databases. Critical Care Time devoted to patient care services described in this note is 35  minutes.  This Critical care time does not reflrect procedure time or supervisory time of NP but could involve care discussion time Overall, patient is critically ill, prognosis is guarded.  Patient with Multiorgan failure and at high risk for cardiac arrest and death.    Corrin Parker, M.D.  [facility] Pulmonary & Critical [facility] Medicine  Medical Director [place] Director [facility] Cardio-Pulmonary Department

## 2015-08-21 NOTE — Consult Note (Signed)
I have discussed clinical status with Family, and they have agreed to make patient a DNI

## 2015-08-21 NOTE — Progress Notes (Addendum)
Pharmacy Antibiotic Note - Day 6  [name] is a 82 y.o. male admitted on 08/14/2015 with pneumonia.  Pharmacy has been consulted for cefazolin dosing for heavy growth klebsiella. Patient receives regularly scheduled HD MWF. Patient received dialysis on 2/26 and next scheduled dialysis on 2/28.    Plan: Continue cefazolin 2g IV qHD session. Patient received dialysis on 2/26. Will order cefazolin 2g IV x 1 on 2/27. Next scheduled dialysis is 2/28.   Height:  (175.3 cm) Weight:  (BEDSCALE INOPERATIVE) IBW/kg (Calculated) : 70.7  Temp (24hrs), Avg:97.3 F (36.3 C), Min:97 F (36.1 C), Max:97.4 F (36.3 C)   Recent Labs Lab 08/17/15 0510 08/18/15 0509 08/19/15 0428 08/20/15 0457 08/21/15 0514  WBC 17.0* 13.3* 14.1* 10.8* 8.8  CREATININE 8.02* 5.61* 6.96* 8.20* 8.11*    Estimated Creatinine Clearance: 7.8 mL/min (by C-G formula based on Cr of 8.11).    Allergies  Allergen Reactions  . Penicillins Swelling    Antimicrobials this admission: Cefepime 2/22 >> 2/25 Cefazolin 2/25 >>   Microbiology results: 2/22 BCx x 2: NGTD 2/22 Sputum: heavy growth gram negative rods, Klebsiella   2/20 MRSA PCR: negative   Pharmacy will continue to monitor and adjust per consult.    Camala Talwar L 08/21/2015 11:51 AM

## 2015-08-21 NOTE — Progress Notes (Signed)
Subjective:  Patient seen at bedside. Remains critically ill. Currently on BiPAP. Had hemodialysis yesterday.  Objective:  Vital signs in last 24 hours:  Temp:  [97 F (36.1 C)-97.9 F (36.6 C)] 97.3 F (36.3 C) (02/27 0400) Pulse Rate:  [52-72] 59 (02/27 0630) Resp:  [12-21] 13 (02/27 0630) BP: (66-134)/(35-92) 103/42 mmHg (02/27 0630) SpO2:  [94 %-100 %] 100 % (02/27 0630) FiO2 (%):  [28 %-45 %] 28 % (02/27 0400)  Weight change:  Filed Weights    Intake/Output:    Intake/Output Summary (Last 24 hours) at 08/21/15 0816 Last data filed at 08/21/15 0600  Gross per 24 hour  Intake  77.24 ml  Output   -313 ml  Net 390.24 ml     Physical Exam: General: Critically ill appearing  HEENT [place]/AT bipap facemask on  Neck supple  Pulm/lungs Scattered rhonchi, on bipap  CVS/Heart Paced rhythm, irregular  Abdomen:  Soft, NT, ND  Extremities: + dependent  edema  Neurologic: Not following commands  Skin: B/l UE ecchymoses  Access: Left arm AVF, good bruit       Basic Metabolic Panel:   Recent Labs Lab 08/14/15 1236  08/17/15 0510 08/18/15 0509 08/19/15 0428 08/20/15 0457 08/21/15 0514  NA  --   < > 138 140 134* 139 138  K  --   < > 4.2 3.8 3.9 4.6 4.2  CL  --   < > 95* 96* 92* 94* 94*  CO2  --   < > 28 32 GLUCOSE  --   < > 132* 117* 126* 106* 94  BUN  --   < > 85* 61* 84* 105* 90*  CREATININE 8.81*  < > 8.02* 5.61* 6.96* 8.20* 8.11*  CALCIUM  --   < > 9.1 9.2 9.2 10.0 9.4  MG 2.2  --   --   --   --   --  2.4  PHOS 7.3*  --   --   --   --   --  8.1*  < > = values in this interval not displayed.   CBC:  Recent Labs Lab 08/14/15 1235  08/17/15 0510 08/18/15 0509 08/19/15 0428 08/20/15 0457 08/21/15 0514  WBC 25.0*  < > 17.0* 13.3* 14.1* 10.8* 8.8  NEUTROABS 23.0*  --   --   --   --   --   --   HGB 8.7*  < > 8.7* 8.4* 8.0* 8.0* 7.6*  HCT 27.7*  < > 26.7* 26.4* 25.3* 25.0* 23.6*  MCV 91.9  < > 89.0 91.5 89.9 89.6 91.1  PLT 346  < > 308 301  307 285 260  < > = values in this interval not displayed.    Microbiology:  Recent Results (from the past 720 hour(s))  MRSA PCR Screening     Status: None   Collection Time: 08/14/15 12:35 PM  Result Value Ref Range Status   MRSA by PCR NEGATIVE NEGATIVE Final    Comment:        The GeneXpert MRSA Assay (FDA approved for NASAL specimens only), is one component of a comprehensive MRSA colonization surveillance program. It is not intended to diagnose MRSA infection nor to guide or monitor treatment for MRSA infections.   Culture, expectorated sputum-assessment     Status: None   Collection Time: 08/16/15 12:25 AM  Result Value Ref Range Status   Specimen Description SPUTUM  Final   Special Requests Normal  Final   Sputum  evaluation THIS SPECIMEN IS ACCEPTABLE FOR SPUTUM CULTURE  Final   Report Status 08/16/2015 FINAL  Final  Culture, respiratory (NON-Expectorated)     Status: None   Collection Time: 08/16/15 12:25 AM  Result Value Ref Range Status   Specimen Description SPUTUM  Final   Special Requests Normal Reflexed from [id number]  Final   Gram Stain   Final    FEW WBC SEEN NO SQUAMOUS EPITHELIAL CELLS PRESENT FEW GRAM POSITIVE RODS RARE GRAM NEGATIVE RODS EXCELLENT SPECIMEN - 90-100% WBCS    Culture LIGHT GROWTH KLEBSIELLA PNEUMONIAE  Final   Report Status 08/19/2015 FINAL  Final   Organism ID, Bacteria KLEBSIELLA PNEUMONIAE  Final      Susceptibility   Klebsiella pneumoniae - MIC*    AMPICILLIN >=32 RESISTANT Resistant     CEFAZOLIN <=4 SENSITIVE Sensitive     CEFEPIME <=1 SENSITIVE Sensitive     CEFTAZIDIME <=1 SENSITIVE Sensitive     CEFTRIAXONE <=1 SENSITIVE Sensitive     CIPROFLOXACIN <=0.25 SENSITIVE Sensitive     GENTAMICIN <=1 SENSITIVE Sensitive     IMIPENEM <=0.25 SENSITIVE Sensitive     TRIMETH/SULFA <=20 SENSITIVE Sensitive     AMPICILLIN/SULBACTAM 4 SENSITIVE Sensitive     PIP/TAZO <=4 SENSITIVE Sensitive     Extended ESBL NEGATIVE Sensitive      * LIGHT GROWTH KLEBSIELLA PNEUMONIAE  Culture, blood (Routine X 2) w Reflex to ID Panel     Status: None (Preliminary result)   Collection Time: 08/16/15 10:51 AM  Result Value Ref Range Status   Specimen Description BLOOD RIGHT HAND  Final   Special Requests   Final    BOTTLES DRAWN AEROBIC AND ANAEROBIC ANA AER   Culture NO GROWTH 4 DAYS  Final   Report Status PENDING  Incomplete  Culture, blood (Routine X 2) w Reflex to ID Panel     Status: None (Preliminary result)   Collection Time: 08/16/15 11:09 AM  Result Value Ref Range Status   Specimen Description BLOOD RIGHT HAND  Final   Special Requests BOTTLES DRAWN AEROBIC AND ANAEROBIC  Final   Culture NO GROWTH 4 DAYS  Final   Report Status PENDING  Incomplete  Culture, expectorated sputum-assessment     Status: None   Collection Time: 08/16/15 12:15 PM  Result Value Ref Range Status   Specimen Description EXPECTORATED SPUTUM  Final   Special Requests NONE  Final   Sputum evaluation THIS SPECIMEN IS ACCEPTABLE FOR SPUTUM CULTURE  Final   Report Status 08/16/2015 FINAL  Final  Culture, respiratory (NON-Expectorated)     Status: None   Collection Time: 08/16/15 12:15 PM  Result Value Ref Range Status   Specimen Description EXPECTORATED SPUTUM  Final   Special Requests NONE Reflexed from [id number]  Final   Gram Stain   Final    FEW WBC SEEN FEW SQUAMOUS EPITHELIAL CELLS PRESENT MODERATE GRAM POSITIVE RODS FEW GRAM NEGATIVE RODS FEW GRAM POSITIVE COCCI GOOD SPECIMEN - 80-90% WBCS    Culture   Final    HEAVY GROWTH KLEBSIELLA PNEUMONIAE HEAVY GROWTH HAEMOPHILUS PARAINFLUENZAE BETA LACTAMASE NEGATIVE    Report Status 08/19/2015 FINAL  Final   Organism ID, Bacteria KLEBSIELLA PNEUMONIAE  Final      Susceptibility   Klebsiella pneumoniae - MIC*    AMPICILLIN >=32 RESISTANT Resistant     CEFAZOLIN <=4 SENSITIVE Sensitive     CEFEPIME <=1 SENSITIVE Sensitive     CEFTAZIDIME <=1 SENSITIVE Sensitive     CEFTRIAXONE <=  1  SENSITIVE Sensitive     CIPROFLOXACIN <=0.25 SENSITIVE Sensitive     GENTAMICIN <=1 SENSITIVE Sensitive     IMIPENEM <=0.25 SENSITIVE Sensitive     TRIMETH/SULFA <=20 SENSITIVE Sensitive     AMPICILLIN/SULBACTAM 4 SENSITIVE Sensitive     PIP/TAZO <=4 SENSITIVE Sensitive     Extended ESBL NEGATIVE Sensitive     * HEAVY GROWTH KLEBSIELLA PNEUMONIAE    Coagulation Studies: No results for input(s): LABPROT, INR in the last 72 hours.  Urinalysis: No results for input(s): COLORURINE, LABSPEC, PHURINE, GLUCOSEU, HGBUR, BILIRUBINUR, KETONESUR, PROTEINUR, UROBILINOGEN, NITRITE, LEUKOCYTESUR in the last 72 hours.  Invalid input(s): APPERANCEUR    Imaging: Dg Chest Port 1 View  08/21/2015  CLINICAL DATA:  Respiratory failure EXAM: PORTABLE CHEST 1 VIEW COMPARISON:  August 19, 2015 FINDINGS: Endotracheal tube has been removed. No pneumothorax. There is mild bibasilar atelectasis. No new opacity. Heart is mildly enlarged with pulmonary vascularity within normal limits. No adenopathy. Pacemaker leads are attached to the right atrium and right ventricle. IMPRESSION: No pneumothorax. Mild bibasilar atelectasis. No frank edema or consolidation. Stable cardiomegaly. Electronically Signed   By: Bretta Bang III M.D.   On: 08/21/2015 07:27     Medications:   . norepinephrine (LEVOPHED) Adult infusion 1.2 mcg/min (08/21/15 0604)   . albuterol  2.5 mg Nebulization Q6H  . antiseptic oral rinse  7 mL Mouth Rinse q12n4p  . aspirin EC  325 mg Oral BID  . carbidopa-levodopa  1 tablet Oral BID  .  ceFAZolin (ANCEF) IV  2 g Intravenous Q M,W,F-HD  . chlorhexidine  15 mL Mouth Rinse BID  . cinacalcet  30 mg Oral Q breakfast  . heparin subcutaneous  5,000 Units Subcutaneous Q12H  . LORazepam  1 mg Intravenous Once  . midodrine  5 mg Oral TID WC  . pantoprazole  40 mg Oral Q1200  . pravastatin  20 mg Oral q1800  . sodium chloride flush  10-40 mL Intracatheter Q12H   acetaminophen, albuterol,  morphine injection, sodium chloride flush  Assessment/ Plan:  82 y.o. male [name] is a 82 y.o. white male with Parkinson's, coronary artery disease, hyperlipidemia, hypertension, GERD,. 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 80% stenosis of the tibioperoneal trunk with peroneal runoff distally which was diffusely diseased. Occlusion of the anterior tibial artery with reconstitution of the anterior tibial artery in the lower leg, hypoxic PEA arrest 08/14/15, Left ankle fracture: ORIF for left ankle. 12/7 Dr. Joice Lofts,   [facility] MWF [facility] Serve [place] Ditto  1. End Stage Renal Disease  / MWF/  - Patient had hemodialysis yesterday. No urgent indication for dialysis today. We will plan for hemodialysis again tomorrow.  2.  Acute resp failure S/P hypoxic PEA arrest, extubated 08/19/15, has klebsiella pneumonia - currently on bipap, continue fo rnow.  3. Secondary Hyperparathyroidism  - phos quite high at 8.1, should come down with HD.   4. Anemia of chronic kidney disease: hemoglobin 8.0 - hgb currently 7.6, transfuse for hgb of 7 or less.  Will consider use of epogne as well.  5. dependent edema - UF with HD as tolerated     LOS: 7 Elnor Renovato 2/27/20178:16 AM

## 2015-08-22 ENCOUNTER — Ambulatory Visit: Payer: Medicare Other | Admitting: Internal Medicine

## 2015-08-22 ENCOUNTER — Inpatient Hospital Stay: Payer: Medicare Other

## 2015-08-22 LAB — CULTURE, BLOOD (ROUTINE X 2)
CULTURE: NO GROWTH
Culture: NO GROWTH

## 2015-08-22 LAB — CBC
HEMATOCRIT: 24.5 % — AB (ref 40.0–52.0)
HEMOGLOBIN: 7.9 g/dL — AB (ref 13.0–18.0)
MCH: 29.4 pg (ref 26.0–34.0)
MCHC: 32.1 g/dL (ref 32.0–36.0)
MCV: 91.4 fL (ref 80.0–100.0)
Platelets: 282 10*3/uL (ref 150–440)
RBC: 2.68 MIL/uL — AB (ref 4.40–5.90)
RDW: 16.7 % — ABNORMAL HIGH (ref 11.5–14.5)
WBC: 10.3 10*3/uL (ref 3.8–10.6)

## 2015-08-22 LAB — BASIC METABOLIC PANEL
Anion gap: 20 — ABNORMAL HIGH (ref 5–15)
BUN: 100 mg/dL — AB (ref 6–20)
CHLORIDE: 95 mmol/L — AB (ref 101–111)
CO2: 25 mmol/L (ref 22–32)
Calcium: 9.6 mg/dL (ref 8.9–10.3)
Creatinine, Ser: 8.96 mg/dL — ABNORMAL HIGH (ref 0.61–1.24)
GFR calc Af Amer: 5 mL/min — ABNORMAL LOW (ref >60)
GFR calc non Af Amer: 5 mL/min — ABNORMAL LOW (ref >60)
Glucose, Bld: 106 mg/dL — ABNORMAL HIGH (ref 65–99)
POTASSIUM: 4.8 mmol/L (ref 3.5–5.1)
SODIUM: 140 mmol/L (ref 135–145)

## 2015-08-22 LAB — LIPID PANEL
CHOL/HDL RATIO: 4.5 ratio
Cholesterol: 149 mg/dL (ref 0–200)
HDL: 33 mg/dL — AB (ref >40)
LDL CALC: 73 mg/dL (ref 0–99)
TRIGLYCERIDES: 214 mg/dL — AB (ref <150)
VLDL: 43 mg/dL — ABNORMAL HIGH (ref 0–40)

## 2015-08-22 LAB — PHOSPHORUS: PHOSPHORUS: 9.2 mg/dL — AB (ref 2.5–4.6)

## 2015-08-22 LAB — MAGNESIUM: MAGNESIUM: 2.5 mg/dL — AB (ref 1.7–2.4)

## 2015-08-22 MED ORDER — NOREPINEPHRINE BITARTRATE 1 MG/ML IV SOLN
0.0000 ug/min | INTRAVENOUS | Status: DC
  Filled 2015-08-22 (×2): qty 4

## 2015-08-22 MED ORDER — ALBUTEROL SULFATE (2.5 MG/3ML) 0.083% IN NEBU
2.5000 mg | INHALATION_SOLUTION | RESPIRATORY_TRACT | Status: DC
  Administered 2015-08-22 – 2015-08-25 (×18): 2.5 mg via RESPIRATORY_TRACT
  Filled 2015-08-22 (×17): qty 3

## 2015-08-22 MED ORDER — CEFAZOLIN SODIUM-DEXTROSE 2-3 GM-% IV SOLR
2.0000 g | Freq: Once | INTRAVENOUS | Status: AC
  Administered 2015-08-22: 2 g via INTRAVENOUS
  Filled 2015-08-22: qty 50

## 2015-08-22 MED ORDER — CETYLPYRIDINIUM CHLORIDE 0.05 % MT LIQD
7.0000 mL | Freq: Two times a day (BID) | OROMUCOSAL | Status: DC
  Administered 2015-08-22 – 2015-08-24 (×5): 7 mL via OROMUCOSAL

## 2015-08-22 MED ORDER — ASPIRIN EC 325 MG PO TBEC
325.0000 mg | DELAYED_RELEASE_TABLET | Freq: Every day | ORAL | Status: DC
  Administered 2015-08-23 – 2015-08-24 (×2): 325 mg via ORAL
  Filled 2015-08-22 (×2): qty 1

## 2015-08-22 MED ORDER — DEXTROSE 5 % IV SOLN
0.0000 ug/min | INTRAVENOUS | Status: AC
  Administered 2015-08-22: 2 ug/min via INTRAVENOUS
  Filled 2015-08-22: qty 4

## 2015-08-22 MED ORDER — EPOETIN ALFA 10000 UNIT/ML IJ SOLN
10000.0000 [IU] | INTRAMUSCULAR | Status: DC
  Administered 2015-08-23: 10000 [IU] via INTRAVENOUS
  Filled 2015-08-22 (×3): qty 1

## 2015-08-22 MED ORDER — HYDROCORTISONE NA SUCCINATE PF 100 MG IJ SOLR
50.0000 mg | Freq: Four times a day (QID) | INTRAMUSCULAR | Status: DC
  Administered 2015-08-22 – 2015-08-25 (×12): 50 mg via INTRAVENOUS
  Filled 2015-08-22 (×12): qty 2

## 2015-08-22 MED ORDER — ALPRAZOLAM 0.5 MG PO TABS
0.5000 mg | ORAL_TABLET | Freq: Every day | ORAL | Status: DC | PRN
  Administered 2015-08-22: 0.5 mg via ORAL
  Filled 2015-08-22: qty 1

## 2015-08-22 MED ORDER — BUDESONIDE 0.5 MG/2ML IN SUSP
0.5000 mg | Freq: Two times a day (BID) | RESPIRATORY_TRACT | Status: DC
  Administered 2015-08-22 – 2015-08-25 (×7): 0.5 mg via RESPIRATORY_TRACT
  Filled 2015-08-22 (×7): qty 2

## 2015-08-22 MED ORDER — NOREPINEPHRINE BITARTRATE 1 MG/ML IV SOLN
0.0000 ug/min | INTRAVENOUS | Status: AC
  Administered 2015-08-22: 1.8 ug/min via INTRAVENOUS
  Filled 2015-08-22: qty 4

## 2015-08-22 NOTE — Progress Notes (Signed)
Speech Language Pathology Treatment: Dysphagia  Patient Details Name: [name] MRN: [id number] DOB: 1928-08-29 Today's Date: 08/22/2015 Time: 9562-1308 SLP Time Calculation (min) (ACUTE ONLY): 45 min  Assessment / Plan / Recommendation Clinical Impression  Pt continues to present w/ declined Cognitive status for safe oral intake. Pt presented w/ moderate-severe oropharyngeal phase dysphagia requiring max. Verbal/tactile cues to complete the swallow and clear orally. Pt was frequently drowsy and closed eyes; mostly nonverbal. Pt is at high risk for aspiration and exhibited a delayed, congested cough/throat clearing post trial of Honey consistency liquid. Due the high risk for aspiration, rec. Continue NPO status w/ oral care frequently for oral hygiene and oral stim. MD consulted and agreed. MD will f/u w/ pt's family re: POC. ST will f/u w/ pt's status tomorrow. NSG present and updated.     HPI HPI: Pt is a 82 y.o. male with a known history of Stroke, MI, CAD, end-stage renal disease on hemodialysis, PAD and Parkinson's disease who presented for elective angiogram. According to the nurse patient had wheezing and cough upon arrival. He was placed on a nonrebreather due to low saturations. Near the end of the case, O2 sats are decreasing. Patient became unresponsive and was immediately bad. Patient was in PEA arrest. He was intubated, epinephrine given and CPR was started. After 2 minutes patient regained pulse and dopamine infusion was started. Pt was orally intubated then extubated on 08/19/15. He presents w/ low BP(meds); drowsiness, and requires cues to continue to attend to task during this session. Pt was nonverbal and did not attempt to communicate w/ SLP. Pt has been NPO since extubation x2 days ago but has required BiPAP for respiratory support since that time. Currently on [place] O2 support. NSG and other disciplines have noted lethargy and difficulty arousing pt for follow through w/  tasks/therapy. Pt has only been able to give his name. NSG reported no improvement is pt's alertness and attention to task during po meds in puree.       SLP Plan  Continue with current plan of care     Recommendations  Diet recommendations: NPO Medication Administration: Crushed with puree (it pt can alert/awaken and attend to task w/ NSG) Supervision: Full supervision/cueing for compensatory strategies;Trained caregiver to feed patient Compensations: Minimize environmental distractions;Slow rate;Small sips/bites (check for oral clearing; max. cues to follow through) Postural Changes and/or Swallow Maneuvers: Seated upright 90 degrees             Oral Care Recommendations: Oral care QID;Staff/trained caregiver to provide oral care Follow up Recommendations: Skilled Nursing facility (TBD) Plan: Continue with current plan of care     GO               Jerilynn Som, MS, CCC-SLP  [name] 08/22/2015, 3:52 PM

## 2015-08-22 NOTE — Progress Notes (Signed)
Pre-hd tx 

## 2015-08-22 NOTE — Care Management (Signed)
At the time of progression rounds, patient remained on levophed and npo since the weekend.  There is discussion regarding dobb hoff tube for nutrition. SLP consult. Palliative care consult

## 2015-08-22 NOTE — Progress Notes (Signed)
Post hd tx 

## 2015-08-22 NOTE — Progress Notes (Addendum)
Nutrition Follow-up    INTERVENTION:   Coordination of Care: if unable to advanced diet within next 24-48 hours, recommend insertion of dobhoff tube for nutrition if aggressive intervention is warranted. Continue to assess   NUTRITION DIAGNOSIS:   Inadequate oral intake related to acute illness as evidenced by NPO status.  GOAL:   Provide needs based on ASPEN/SCCM guidelines   MONITOR:    (Energy Intake, Anthropometrics, Digestive System, Electrolyte/Renal Profile, Glucose Profile, Pulmonary)  REASON FOR ASSESSMENT:   Ventilator, Consult Enteral/tube feeding initiation and management  ASSESSMENT:    Pt off Bipap, HD today, pt confused, intermittently follows commands, remains on levophed; noted palliative care consult pending  Diet Order:  Diet NPO time specified (day 3)  Energy Intake: NPO since extubation on Saturday, SLP following but has been unable to advance diet thus far   Recent Labs Lab 08/20/15 0457 08/21/15 0514 08/22/15 0520  NA 139 138 140  K 4.6 4.2 4.8  CL 94* 94* 95*  CO2 BUN 105* 90* 100*  CREATININE 8.20* 8.11* 8.96*  CALCIUM 10.0 9.4 9.6  MG  --  2.4 2.5*  PHOS  --  8.1* 9.2*  GLUCOSE 106* 94 106*    Glucose Profile:  Recent Labs  08/20/15 0743 08/20/15 1123 08/21/15 2343  GLUCAP 94 87 92   Meds: reviewed  Height:   Ht Readings from Last 1 Encounters:  08/14/15  (1.753 m)    Weight:   Wt Readings from Last 1 Encounters:  07/27/15 208 lb (94.348 kg)    BMI:  Body mass index is 31.4 kg/(m^2).  Estimated Nutritional Needs:   Kcal:  1080-1375 kcals (11-14 kcals/kg) using wt of 98.2 kg  Protein:  146 g/kg (2.0 g/kg) using IBW 72.7 kg  Fluid:  1825-2190 mL (25-30 ml/kg)   HIGH Care Level  Romelle Starcher MS, RD, LDN [phone] Pager  [phone] Weekend/On-Call Pager

## 2015-08-22 NOTE — Progress Notes (Signed)
Pharmacy Antibiotic Note - Day 7  [name] is a 82 y.o. male admitted on 08/14/2015 with pneumonia.  Pharmacy has been consulted for cefazolin dosing for heavy growth klebsiella. Patient receives regularly scheduled HD MWf. Patient to have dialysis 2/28 and 3/1.   Plan: Continue cefazolin 2g IV qHD session. With dialysis scheduled for today, will order additional cefazolin 2g for 2/28.   Height:  (175.3 cm) Weight:  (BEDSCALE INOPERATIVE) IBW/kg (Calculated) : 70.7  Temp (24hrs), Avg:97.6 F (36.4 C), Min:97.2 F (36.2 C), Max:98.4 F (36.9 C)   Recent Labs Lab 08/18/15 0509 08/19/15 0428 08/20/15 0457 08/21/15 0514 08/22/15 0520  WBC 13.3* 14.1* 10.8* 8.8 10.3  CREATININE 5.61* 6.96* 8.20* 8.11* 8.96*    Estimated Creatinine Clearance: 7 mL/min (by C-G formula based on Cr of 8.96).    Allergies  Allergen Reactions  . Penicillins Swelling    Antimicrobials this admission: Cefepime 2/22 >> 2/25 Cefazolin 2/25 >>   Microbiology results: 2/22 BCx x 2: NGTD 2/22 Sputum: heavy growth gram negative rods, Klebsiella   2/20 MRSA PCR: negative   Pharmacy will continue to monitor and adjust per consult.    Simpson,Michael L 08/22/2015 9:19 AM

## 2015-08-22 NOTE — Progress Notes (Signed)
Hemodialysis completed. 

## 2015-08-22 NOTE — Progress Notes (Signed)
Subjective:  Patient doing somewhat better today. Currently off of bipap. Due for dialysis today.  Objective:  Vital signs in last 24 hours:  Temp:  [97.2 F (36.2 C)-98.4 F (36.9 C)] 97.3 F (36.3 C) (02/28 0700) Pulse Rate:  [31-121] 59 (02/28 0700) Resp:  [11-25] 13 (02/28 0700) BP: (73-125)/(36-72) 109/44 mmHg (02/28 0700) SpO2:  [93 %-100 %] 100 % (02/28 0700) FiO2 (%):  [28 %] 28 % (02/28 0209)  Weight change:  Filed Weights    Intake/Output:    Intake/Output Summary (Last 24 hours) at 08/22/15 0813 Last data filed at 08/22/15 0700  Gross per 24 hour  Intake 149.58 ml  Output      0 ml  Net 149.58 ml     Physical Exam: General: Critically ill appearing  HEENT [place]/AT bipap facemask on  Neck supple  Pulm/lungs Scattered rhonchi, on bipap  CVS/Heart Paced rhythm, irregular  Abdomen:  Soft, NT, ND  Extremities: + dependent  edema  Neurologic: Awake, alert, not consistently following commands  Skin: B/l UE ecchymoses  Access: Left arm AVF, good bruit       Basic Metabolic Panel:   Recent Labs Lab 08/18/15 0509 08/19/15 0428 08/20/15 0457 08/21/15 0514 08/22/15 0520  NA 140 134* 139 138 140  K 3.8 3.9 4.6 4.2 4.8  CL 96* 92* 94* 94* 95*  CO2 32 GLUCOSE 117* 126* 106* 94 106*  BUN 61* 84* 105* 90* 100*  CREATININE 5.61* 6.96* 8.20* 8.11* 8.96*  CALCIUM 9.2 9.2 10.0 9.4 9.6  MG  --   --   --  2.4 2.5*  PHOS  --   --   --  8.1* 9.2*     CBC:  Recent Labs Lab 08/18/15 0509 08/19/15 0428 08/20/15 0457 08/21/15 0514 08/22/15 0520  WBC 13.3* 14.1* 10.8* 8.8 10.3  HGB 8.4* 8.0* 8.0* 7.6* 7.9*  HCT 26.4* 25.3* 25.0* 23.6* 24.5*  MCV 91.5 89.9 89.6 91.1 91.4  PLT 301 307 285 260 282      Microbiology:  Recent Results (from the past 720 hour(s))  MRSA PCR Screening     Status: None   Collection Time: 08/14/15 12:35 PM  Result Value Ref Range Status   MRSA by PCR NEGATIVE NEGATIVE Final    Comment:        The GeneXpert  MRSA Assay (FDA approved for NASAL specimens only), is one component of a comprehensive MRSA colonization surveillance program. It is not intended to diagnose MRSA infection nor to guide or monitor treatment for MRSA infections.   Culture, expectorated sputum-assessment     Status: None   Collection Time: 08/16/15 12:25 AM  Result Value Ref Range Status   Specimen Description SPUTUM  Final   Special Requests Normal  Final   Sputum evaluation THIS SPECIMEN IS ACCEPTABLE FOR SPUTUM CULTURE  Final   Report Status 08/16/2015 FINAL  Final  Culture, respiratory (NON-Expectorated)     Status: None   Collection Time: 08/16/15 12:25 AM  Result Value Ref Range Status   Specimen Description SPUTUM  Final   Special Requests Normal Reflexed from [id number]  Final   Gram Stain   Final    FEW WBC SEEN NO SQUAMOUS EPITHELIAL CELLS PRESENT FEW GRAM POSITIVE RODS RARE GRAM NEGATIVE RODS EXCELLENT SPECIMEN - 90-100% WBCS    Culture LIGHT GROWTH KLEBSIELLA PNEUMONIAE  Final   Report Status 08/19/2015 FINAL  Final   Organism ID, Bacteria KLEBSIELLA PNEUMONIAE  Final  Susceptibility   Klebsiella pneumoniae - MIC*    AMPICILLIN >=32 RESISTANT Resistant     CEFAZOLIN <=4 SENSITIVE Sensitive     CEFEPIME <=1 SENSITIVE Sensitive     CEFTAZIDIME <=1 SENSITIVE Sensitive     CEFTRIAXONE <=1 SENSITIVE Sensitive     CIPROFLOXACIN <=0.25 SENSITIVE Sensitive     GENTAMICIN <=1 SENSITIVE Sensitive     IMIPENEM <=0.25 SENSITIVE Sensitive     TRIMETH/SULFA <=20 SENSITIVE Sensitive     AMPICILLIN/SULBACTAM 4 SENSITIVE Sensitive     PIP/TAZO <=4 SENSITIVE Sensitive     Extended ESBL NEGATIVE Sensitive     * LIGHT GROWTH KLEBSIELLA PNEUMONIAE  Culture, blood (Routine X 2) w Reflex to ID Panel     Status: None (Preliminary result)   Collection Time: 08/16/15 10:51 AM  Result Value Ref Range Status   Specimen Description BLOOD RIGHT HAND  Final   Special Requests   Final    BOTTLES DRAWN AEROBIC AND  ANAEROBIC ANA AER   Culture NO GROWTH 4 DAYS  Final   Report Status PENDING  Incomplete  Culture, blood (Routine X 2) w Reflex to ID Panel     Status: None (Preliminary result)   Collection Time: 08/16/15 11:09 AM  Result Value Ref Range Status   Specimen Description BLOOD RIGHT HAND  Final   Special Requests BOTTLES DRAWN AEROBIC AND ANAEROBIC  Final   Culture NO GROWTH 4 DAYS  Final   Report Status PENDING  Incomplete  Culture, expectorated sputum-assessment     Status: None   Collection Time: 08/16/15 12:15 PM  Result Value Ref Range Status   Specimen Description EXPECTORATED SPUTUM  Final   Special Requests NONE  Final   Sputum evaluation THIS SPECIMEN IS ACCEPTABLE FOR SPUTUM CULTURE  Final   Report Status 08/16/2015 FINAL  Final  Culture, respiratory (NON-Expectorated)     Status: None   Collection Time: 08/16/15 12:15 PM  Result Value Ref Range Status   Specimen Description EXPECTORATED SPUTUM  Final   Special Requests NONE Reflexed from [id number]  Final   Gram Stain   Final    FEW WBC SEEN FEW SQUAMOUS EPITHELIAL CELLS PRESENT MODERATE GRAM POSITIVE RODS FEW GRAM NEGATIVE RODS FEW GRAM POSITIVE COCCI GOOD SPECIMEN - 80-90% WBCS    Culture   Final    HEAVY GROWTH KLEBSIELLA PNEUMONIAE HEAVY GROWTH HAEMOPHILUS PARAINFLUENZAE BETA LACTAMASE NEGATIVE    Report Status 08/19/2015 FINAL  Final   Organism ID, Bacteria KLEBSIELLA PNEUMONIAE  Final      Susceptibility   Klebsiella pneumoniae - MIC*    AMPICILLIN >=32 RESISTANT Resistant     CEFAZOLIN <=4 SENSITIVE Sensitive     CEFEPIME <=1 SENSITIVE Sensitive     CEFTAZIDIME <=1 SENSITIVE Sensitive     CEFTRIAXONE <=1 SENSITIVE Sensitive     CIPROFLOXACIN <=0.25 SENSITIVE Sensitive     GENTAMICIN <=1 SENSITIVE Sensitive     IMIPENEM <=0.25 SENSITIVE Sensitive     TRIMETH/SULFA <=20 SENSITIVE Sensitive     AMPICILLIN/SULBACTAM 4 SENSITIVE Sensitive     PIP/TAZO <=4 SENSITIVE Sensitive     Extended ESBL  NEGATIVE Sensitive     * HEAVY GROWTH KLEBSIELLA PNEUMONIAE    Coagulation Studies: No results for input(s): LABPROT, INR in the last 72 hours.  Urinalysis: No results for input(s): COLORURINE, LABSPEC, PHURINE, GLUCOSEU, HGBUR, BILIRUBINUR, KETONESUR, PROTEINUR, UROBILINOGEN, NITRITE, LEUKOCYTESUR in the last 72 hours.  Invalid input(s): APPERANCEUR    Imaging: Dg Chest [facility] [address]  08/22/2015  CLINICAL DATA:  Respiratory failure, status post cardiac arrest EXAM: PORTABLE CHEST 1 VIEW COMPARISON:  Portable chest x-ray of August 21, 2015 FINDINGS: The lungs are adequately inflated. The pulmonary interstitial markings remain increased but have improved slightly since yesterday's study. The cardiac silhouette remains enlarged. The pulmonary vascularity remains engorged but is more distinct. The permanent pacemaker is in stable position. There is no significant pleural effusion and no pneumothorax. IMPRESSION: CHF with mild pulmonary interstitial edema somewhat improved since yesterday's study. Electronically Signed   By: David  [place] M.D.   On: 08/22/2015 07:27   Dg Chest Port 1 View  08/21/2015  CLINICAL DATA:  Respiratory failure EXAM: PORTABLE CHEST 1 VIEW COMPARISON:  August 19, 2015 FINDINGS: Endotracheal tube has been removed. No pneumothorax. There is mild bibasilar atelectasis. No new opacity. Heart is mildly enlarged with pulmonary vascularity within normal limits. No adenopathy. Pacemaker leads are attached to the right atrium and right ventricle. IMPRESSION: No pneumothorax. Mild bibasilar atelectasis. No frank edema or consolidation. Stable cardiomegaly. Electronically Signed   By: Bretta Bang III M.D.   On: 08/21/2015 07:27     Medications:   . norepinephrine (LEVOPHED) Adult infusion 2 mcg/min (08/22/15 0700)   . albuterol  2.5 mg Nebulization Q6H  . antiseptic oral rinse  7 mL Mouth Rinse q12n4p  . aspirin EC  325 mg Oral BID  . carbidopa-levodopa  1 tablet Oral  BID  .  ceFAZolin (ANCEF) IV  2 g Intravenous Q M,W,F-HD  . chlorhexidine  15 mL Mouth Rinse BID  . cinacalcet  30 mg Oral Q breakfast  . heparin subcutaneous  5,000 Units Subcutaneous Q12H  . LORazepam  1 mg Intravenous Once  . midodrine  5 mg Oral TID WC  . pantoprazole  40 mg Oral Q1200  . pravastatin  20 mg Oral q1800  . sodium chloride flush  10-40 mL Intracatheter Q12H   acetaminophen, albuterol, morphine injection, sodium chloride flush  Assessment/ Plan:  82 y.o. male [name] is a 82 y.o. white male with Parkinson's, coronary artery disease, hyperlipidemia, hypertension, GERD,. 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 80% stenosis of the tibioperoneal trunk with peroneal runoff distally which was diffusely diseased. Occlusion of the anterior tibial artery with reconstitution of the anterior tibial artery in the lower leg, hypoxic PEA arrest 08/14/15, Left ankle fracture: ORIF for left ankle. 12/7 Dr. Joice Lofts,   [facility] MWF [facility] Serve [place] Ditto  1. End Stage Renal Disease  / MWF/  - Patient had hemodialysis on Sunday. Therefore we held dialysis yesterday. We will plan for dialysis today and then again tomorrow.  2.  Acute resp failure S/P hypoxic PEA arrest, extubated 08/19/15, has klebsiella pneumonia - Off BiPAP this a.m. Appears to be doing well off the ventilator for the moment.  3. Secondary Hyperparathyroidism  - Phosphorus up to 9.2 today, should come down with dialysis. Continue to monitor closely.  4. Anemia of chronic kidney disease: hemoglobin 8.0 - Hemoglobin up to 7.9. We will start Epogen 10,000 units with dialysis on Monday, Wednesday, Friday.  5. dependent edema - Ultrafiltration target 1-1.5 kg today.     LOS: 8 Sieanna Vanstone 2/28/20178:13 AM

## 2015-08-22 NOTE — Progress Notes (Signed)
HD tx start 

## 2015-08-22 NOTE — Progress Notes (Signed)
Pt on bi-pap all night.morphine  givenx1. dreesing on bilateral feet was changed. Remains on levophed drip BP 102/55 now. Resting comfprtably in bed  Without any distress.

## 2015-08-22 NOTE — Progress Notes (Signed)
PT Cancellation Note  Patient Details Name: [name] MRN: [id number] DOB: 06/02/1929   Cancelled Treatment:    Reason Eval/Treat Not Completed: Patient's level of consciousness (Evaluation re-attempted.  Patient with significant lethargy this date.  Opens eyes to max stimuli (sternal rub) from therapist, but unable to maintain for appropriate participation with session.  Limited overall response and command-following attempts noted.  Will re-attempt next date.  RN hopeful alertness will improve after full dialysis session this PM.)   Joeleen Wortley H. Manson Passey, PT, DPT, NCS 08/22/2015, 12:15 PM [phone]

## 2015-08-22 NOTE — Plan of Care (Signed)
Problem: Consults Goal: Skin Care Protocol Initiated - if Braden Score 18 or less If consults are not indicated, leave blank or document N/A Outcome: Completed/Met Date Met:  08/22/15 Pt on Q2 hour turns, low air loss mattress, prevalon boots, and moisture wickining products.   Problem: ICU Phase Progression Outcomes Goal: O2 sats trending toward baseline Outcome: Progressing Pt on 2 L [place]

## 2015-08-22 NOTE — Progress Notes (Signed)
[facility] [place] [facility]   PATIENT NAME: [name]    MR#:  [id number]  DATE OF BIRTH:  01/11/1929  SUBJECTIVE:  Confused, intermittently follows  REVIEW OF SYSTEMS:  Unable to obtain patient's mental status/medical condition  DRUG ALLERGIES:   Allergies  Allergen Reactions  . Penicillins Swelling    VITALS:  Blood pressure 99/43, pulse 59, temperature 97.3 F (36.3 C), temperature source Axillary, resp. rate 12, height  (1.753 m), weight 96.5 kg (212 lb 11.9 oz), SpO2 96 %.  PHYSICAL EXAMINATION:   VITAL SIGNS: Filed Vitals:   08/22/15 1300 08/22/15 1400  BP: 117/47 99/43  Pulse: 58 59  Temp:    Resp: 13 12   GENERAL:82 y.o.male currently in no acute distress.  chronically ill  HEAD: Normocephalic, atraumatic.  EYES: Pupils equal, round, reactive to light. Extraocular muscles intact. No scleral icterus.  MOUTH: Moist mucosal membrane. Dentition intact. No abscess noted.  EAR, NOSE, THROAT: Clear without exudates. No external lesions.  NECK: Supple. No thyromegaly. No nodules. No JVD.  PULMONARY: diffuse coarse breath sounds without wheeze rails or rhonci. No use of accessory muscles, Good respiratory effort. good air entry bilaterally CHEST: Nontender to palpation.  CARDIOVASCULAR: S1 and S2. Regular rate and rhythm. No murmurs, rubs, or gallop1+dema. Pedal pulses 2+ bilaterally.  GASTROINTESTINAL: Soft, nontender, nondistended. No masses. Positive bowel sounds. No hepatosplenomegaly.  MUSCULOSKELETAL: No swelling, clubbing, or edema. Range of motion full in all extremities.  NEUROLOGIC: spontaneous movement all extremities unable to fully assess secondary to patient's mental status medical condition.  SKIN: No ulceration, lesions, rashes, or cyanosis. Skin warm and dry. Turgor intact.  PSYCHIATRIC: The patient is awake       LABORATORY PANEL:   CBC  Recent Labs Lab 08/22/15 0520  WBC 10.3  HGB 7.9*  HCT 24.5*   PLT 282   ------------------------------------------------------------------------------------------------------------------  Chemistries   Recent Labs Lab 08/22/15 0520  NA 140  K 4.8  CL 95*  CO2 25  GLUCOSE 106*  BUN 100*  CREATININE 8.96*  CALCIUM 9.6  MG 2.5*   ------------------------------------------------------------------------------------------------------------------  Cardiac Enzymes No results for input(s): TROPONINI in the last 168 hours. ------------------------------------------------------------------------------------------------------------------  RADIOLOGY:  Dg Chest Port 1 View  08/22/2015  CLINICAL DATA:  Respiratory failure, status post cardiac arrest EXAM: PORTABLE CHEST 1 VIEW COMPARISON:  Portable chest x-ray of August 21, 2015 FINDINGS: The lungs are adequately inflated. The pulmonary interstitial markings remain increased but have improved slightly since yesterday's study. The cardiac silhouette remains enlarged. The pulmonary vascularity remains engorged but is more distinct. The permanent pacemaker is in stable position. There is no significant pleural effusion and no pneumothorax. IMPRESSION: CHF with mild pulmonary interstitial edema somewhat improved since yesterday's study. Electronically Signed   By: David  [place] M.D.   On: 08/22/2015 07:27   Dg Chest Port 1 View  08/21/2015  CLINICAL DATA:  Respiratory failure EXAM: PORTABLE CHEST 1 VIEW COMPARISON:  August 19, 2015 FINDINGS: Endotracheal tube has been removed. No pneumothorax. There is mild bibasilar atelectasis. No new opacity. Heart is mildly enlarged with pulmonary vascularity within normal limits. No adenopathy. Pacemaker leads are attached to the right atrium and right ventricle. IMPRESSION: No pneumothorax. Mild bibasilar atelectasis. No frank edema or consolidation. Stable cardiomegaly. Electronically Signed   By: Bretta Bang III M.D.   On: 08/21/2015 07:27    EKG:   Orders  placed or performed during the hospital encounter of 08/14/15  . EKG 12-Lead  .  EKG 12-Lead    ASSESSMENT AND PLAN:   82 year old gentleman admitted 2/20/17post arrest doing a vascular procedure     1. Acute cardiac arrest. Post PEA arrest.  2. Cardiogenic shockRemains on low-dose Levophed and wean as tolerated Midodrine added  3. End-stage renal disease on hemodialysis. Nephrology to continue with dialysis. 4. Type 2 diabetes- sliding-scale insulin for now 5. History of chronic systolic heart failure. 6. Peripheral vascular disease with ulcer left medial malleolus. Local wound care. 7. Anemia of chronic disease on Epogen with dialysis 8. Malnutrition: Speech evaluation appreciated-will need to remain nothing by mouth     All the records are reviewed and case discussed with Care Management/Social Workerr. Management plans discussed with the patient, family and they are in agreement.  CODE STATUS: dnr  TOTAL TIME TAKING CARE OF THIS PATIENT: 28 minutes.   POSSIBLE D/C IN 2-3 DAYS, DEPENDING ON CLINICAL CONDITION.   Hower,  Mardi Mainland.D on 08/22/2015 at 2:41 PM  Between 7am to 6pm - Pager - [phone]  After 6pm: House Pager: - [phone]  [facility]  Office  [phone]  CC: Primary care physician; Lauro Regulus., MD

## 2015-08-22 NOTE — Progress Notes (Signed)
Chaplain rounded the unit and provided a compassionate presence, support and silent prayer.. Chaplain Flynn Lininger [phone] 

## 2015-08-23 ENCOUNTER — Ambulatory Visit: Payer: Medicare Other | Admitting: Internal Medicine

## 2015-08-23 DIAGNOSIS — R570 Cardiogenic shock: Secondary | ICD-10-CM

## 2015-08-23 NOTE — Consult Note (Addendum)
Palliative Medicine Inpatient Consult Note   Name: [name] Date: 08/23/2015 MRN: [id number]  DOB: [birth year]  Referring Physician: Lytle Butte, MD  Palliative Care consult requested for this 82 y.o. male for goals of medical therapy in patient with acute on chronic respiratory failure.    TODAY'S DISCUSSIONS AND DECISIONS: I met with pt's son and wife and a cousin.  We discussed pts history and current condition.  He had been on dialysis for 7 years and drove until about 3 yrs ago.  He started going downhill after a bout with Shingles 3 yrs ago.  Then, more recently, over the last 2-3 months, he has developed a blank stare.  He would talk and be engaged in conversation, and then he would just stare off into space.  He is doing that now pretty consistently.  Family says he did talk some this am, but then he became nonverbal again.    We discussed comfort care as an option to consider and 'what that would look like'.  They know he cannot be off the levophed while still getting HD.  And, they know giving pt continued HD while not having him eat is illogicial. They know he would not want a feeding tube.  So, due to his new problems of worsened dysphagia and hypotension, they are making the hard decision to choose comfort care instead of aggressive care.  BUT, the pt's son's birthday is tomorrow and son cannot schedule his dad to start comfort care on this date.  He became quite tearful and asked if we could possibly schedule a transition to comfort care for Friday morning.  I confirmed we could do this.    So in the meantime, pt is not to have Levophed increased--though it certainly can continue.  Reasonable care can be undertaken but no further 'coding' measures.  Also, pt can have BIPAP or CPAP as needed (until Friday am)--as he has OSA and these measures can help him sleep and provide comfort.    I will talk with nephrologist about [facility] and will talk with critical care MD and others on  the care team.  Will update Dr. Lavetta Nielsen as well.    On Friday, if pts BP is not maintained well then he will be allowed to stay here for terminal comfort care since we do not transfer unstable pts. If, however, his BP and other VS are stable, then pt would need to be considered for transfer to [facility]. This is because family brought up their wish for [facility] for pt --since they are quite familiar with services there having had other family members who died there.  They would not want him to go home or to the same or different facility setting.  So, its either here or to [facility].  Stopping dialysis and with the pt not eating will mean the pt will be quite terminal --within a few weeks (though dialysis pts can linger a bit longer than others due to subcutaneous fluid reserves).    Brief History: Pt is an 82 yo man with a known history of end-stage renal disease on hemodialysis as well as Parkinson's disease and PAD, who presented for an elective angiogram on 08/14/15. He had wheezing and coughing on arrival and was placed on a  Nonrebreather. He had sats decreasing near the end of the procedure and went into PEA arrest. He was intubated and epi was given and CPR was started.  After 2 minutes, a pulse returned  and a dopamine infusion was started.  He was extubated on 08/19/15.  He continues on low dose Levophed as he cannot tolerate HD without this.  His cognition has worsened and he is felt to have anoxic injury.  He now speaks some, but he cannot appreciate the complexity of his condition, etc. Son is Anthonly Nicole Kindred) and wife is [place].  [name], [name], is here today also.   IMPRESSION Acute cardi-resp failure with hypoxia --s/p CPR for 2 min in-house --still on pressor support due to cardiogenic shock and due to the hypotension of HD --intubated and then extubated  Hypotension ESRD on home  HD PAD with recent angiogram --Vascular ulcer to left medial malleolus H/O CVA CAD w/ h/o  MI Parkinson's Dz Arrythmia with h/o pacemeaker insertion Former Smoker DM2 OSA --used CPAP Chronic Systolic CHF Anemia of chronic disease  Klebsiella infection --pulmonary Post code anoxic encephalopathy ---markedly improved  Pt is very Hard of Hearing Malnutrition --currently has dobbhoff tube for nutrition and meds Dysphagia --severe and now NPO (since 2/27) Dyslipidemia     REVIEW OF SYSTEMS:  Patient is not able to provide ROS due to critical illness and confusion  SPIRITUAL SUPPORT SYSTEM: Yes.  SOCIAL HISTORY:  reports that he quit smoking about 46 years ago. He does not have any smokeless tobacco history on file. He reports that he does not drink alcohol or use illicit drugs.He was a resident of a skilled facility and went to HD three times weekly as well as being transported to the wound care center.    LEGAL DOCUMENTS:  DNR form  CODE STATUS: DNR  PAST MEDICAL HISTORY: Past Medical History  Diagnosis Date  . Renal disorder   . Stroke ([place])   . Coronary artery disease   . Myocardial infarction ([place])   . Parkinson disease ([place])   . Chronic home hemodialysis status ([place])     PAST SURGICAL HISTORY:  Past Surgical History  Procedure Laterality Date  . Pacemaker insertion    . Orif ankle fracture Left 05/31/2015    Procedure: OPEN REDUCTION INTERNAL FIXATION TRIMALLEOLAR IANKLE FRACTURE DISLOCATION;  Surgeon: Corky Mull, MD;  Location: [facility] ORS;  Service: Orthopedics;  Laterality: Left;  . Peripheral vascular catheterization N/A 07/27/2015    Procedure: A/V Shuntogram/Fistulagram;  Surgeon: Algernon Huxley, MD;  Location: [facility];  Service: Cardiovascular;  Laterality: N/A;  . Peripheral vascular catheterization N/A 07/27/2015    Procedure: A/V Shunt Intervention;  Surgeon: Algernon Huxley, MD;  Location: [facility];  Service: Cardiovascular;  Laterality: N/A;  . Peripheral vascular catheterization N/A 08/14/2015    Procedure: Abdominal Aortogram  w/Lower Extremity;  Surgeon: Algernon Huxley, MD;  Location: [facility];  Service: Cardiovascular;  Laterality: N/A;  . Peripheral vascular catheterization  08/14/2015    Procedure: Lower Extremity Intervention;  Surgeon: Algernon Huxley, MD;  Location: [facility] [facility];  Service: Cardiovascular;;    ALLERGIES:  is allergic to penicillins.  MEDICATIONS:  Current Facility-Administered Medications  Medication Dose Route Frequency Provider Last Rate Last Dose  . acetaminophen (TYLENOL) tablet 650 mg  650 mg Oral Q6H PRN Laverle Hobby, MD   650 mg at 08/18/15 1718  . albuterol (PROVENTIL) (2.5 MG/3ML) 0.083% nebulizer solution 2.5 mg  2.5 mg Nebulization Q3H PRN Wilhelmina Mcardle, MD   2.5 mg at 08/20/15 1815  . albuterol (PROVENTIL) (2.5 MG/3ML) 0.083% nebulizer solution 2.5 mg  2.5 mg Nebulization Q4H Flora Lipps, MD  2.5 mg at 08/23/15 1538  . ALPRAZolam Duanne Moron) tablet 0.5 mg  0.5 mg Oral Daily PRN Flora Lipps, MD   0.5 mg at 08/22/15 1652  . antiseptic oral rinse (CPC / CETYLPYRIDINIUM CHLORIDE 0.05%) solution 7 mL  7 mL Mouth Rinse BID Flora Lipps, MD   7 mL at 08/23/15 1059  . aspirin EC tablet 325 mg  325 mg Oral Daily Lytle Butte, MD   325 mg at 08/23/15 1059  . budesonide (PULMICORT) nebulizer solution 0.5 mg  0.5 mg Nebulization BID Flora Lipps, MD   0.5 mg at 08/23/15 0821  . carbidopa-levodopa (SINEMET IR) 25-100 MG per tablet immediate release 1 tablet  1 tablet Oral BID Lytle Butte, MD   1 tablet at 08/23/15 1059  . cinacalcet (SENSIPAR) tablet 30 mg  30 mg Oral Q breakfast Lytle Butte, MD   30 mg at 08/23/15 0859  . epoetin alfa (EPOGEN,PROCRIT) injection 10,000 Units  10,000 Units Intravenous Q M,W,F-HD Munsoor Lateef, MD   10,000 Units at 08/23/15 1449  . heparin injection 5,000 Units  5,000 Units Subcutaneous Q12H Sital Mody, MD   5,000 Units at 08/23/15 1059  . hydrocortisone sodium succinate (SOLU-CORTEF) 100 MG injection 50 mg  50 mg Intravenous Q6H Flora Lipps,  MD   50 mg at 08/23/15 1119  . LORazepam (ATIVAN) injection 1 mg  1 mg Intravenous Once Lytle Butte, MD   Stopped at 08/20/15 1815  . midodrine (PROAMATINE) tablet 5 mg  5 mg Oral TID WC Wilhelmina Mcardle, MD   5 mg at 08/23/15 1251  . morphine 2 MG/ML injection 1-2 mg  1-2 mg Intravenous Q2H PRN Flora Lipps, MD   2 mg at 08/23/15 1337  . norepinephrine (LEVOPHED) 4 mg in dextrose 5 % 250 mL (0.016 mg/mL) infusion  0-40 mcg/min Intravenous Titrated Lytle Butte, MD 5.6 mL/hr at 08/23/15 1330 1.5 mcg/min at 08/23/15 1330  . pantoprazole (PROTONIX) EC tablet 40 mg  40 mg Oral Q1200 Wilhelmina Mcardle, MD   40 mg at 08/23/15 1251  . pravastatin (PRAVACHOL) tablet 20 mg  20 mg Oral q1800 Lytle Butte, MD   20 mg at 08/21/15 1807  . sodium chloride flush (NS) 0.9 % injection 10-40 mL  10-40 mL Intracatheter Q12H Mikael Spray, NP   30 mL at 08/23/15 1059  . sodium chloride flush (NS) 0.9 % injection 10-40 mL  10-40 mL Intracatheter PRN Mikael Spray, NP   10 mL at 08/15/15 2135    Vital Signs: BP 131/53 mmHg  Pulse 62  Temp(Src) 98.2 F (36.8 C) (Axillary)  Resp 21  Ht 5' 9"  (1.753 m)  Wt 96.5 kg (212 lb 11.9 oz)  BMI 31.40 kg/m2  SpO2 100% Filed Weights    Estimated body mass index is 31.4 kg/(m^2) as calculated from the following:   Height as of this encounter: 5' 9"  (1.753 m).   Weight as of this encounter: 96.5 kg (212 lb 11.9 oz).  PERFORMANCE STATUS (ECOG) : 4 - Bedbound  PHYSICAL EXAM:  Restless Looks at me with a blank stare but does not follow commands He mumbles somewhat unintelligibly Seems alert however No JVD or TM Hrt rrr no m Lungs cta ant Abd soft and NT Ext no mottling or cyanosis Skin warm and dry with left foot.ankle  dressed.     LABS: CBC:    Component Value Date/Time   WBC 10.3 08/22/2015 0520   WBC 14.6* 01/11/2014  1412   HGB 7.9* 08/22/2015 0520   HGB 12.2* 01/11/2014 1412   HCT 24.5* 08/22/2015 0520   HCT 38.4* 01/11/2014 1412   PLT  282 08/22/2015 0520   PLT 203 01/11/2014 1412   MCV 91.4 08/22/2015 0520   MCV 102* 01/11/2014 1412   NEUTROABS 23.0* 08/14/2015 1235   NEUTROABS 13.3* 01/11/2014 1412   LYMPHSABS 1.2 08/14/2015 1235   LYMPHSABS 0.8* 01/11/2014 1412   MONOABS 0.7 08/14/2015 1235   MONOABS 0.4 01/11/2014 1412   EOSABS 0.1 08/14/2015 1235   EOSABS 0.0 01/11/2014 1412   BASOSABS 0.0 08/14/2015 1235   BASOSABS 0.0 01/11/2014 1412   Comprehensive Metabolic Panel:    Component Value Date/Time   NA 140 08/22/2015 0520   NA 140 01/11/2014 1412   K 4.8 08/22/2015 0520   K 5.4* 01/11/2014 1412   CL 95* 08/22/2015 0520   CL 101 01/11/2014 1412   CO2 25 08/22/2015 0520   CO2 28 01/11/2014 1412   BUN 100* 08/22/2015 0520   BUN 73* 01/11/2014 1412   CREATININE 8.96* 08/22/2015 0520   CREATININE 7.96* 01/11/2014 1412   GLUCOSE 106* 08/22/2015 0520   GLUCOSE 131* 01/11/2014 1412   CALCIUM 9.6 08/22/2015 0520   CALCIUM 8.2* 01/11/2014 1412   AST 34 08/14/2015 1236   AST 15 03/25/2012 0404   ALT 6* 08/14/2015 1236   ALT 13 03/25/2012 0404   ALKPHOS 98 08/14/2015 1236   ALKPHOS 87 03/25/2012 0404   BILITOT 1.2 08/14/2015 1236   BILITOT 0.4 03/25/2012 0404   PROT 7.0 08/14/2015 1236   PROT 7.4 03/25/2012 0404   ALBUMIN 3.0* 08/14/2015 1236   ALBUMIN 3.5 03/25/2012 0404    More than 50% of the visit was spent in counseling/coordination of care: Yes  Time Spent:  80 minutes

## 2015-08-23 NOTE — Progress Notes (Signed)
Lengthy discussion with patient's son at bedside regarding goals of care. Essentially patient remains nothing by mouth and intolerant of oral feedings meeting to general routes of treatment involve surgical feeding tube placement transition to LTAC versus comfort measures only  Described comfort measures as would allow patient to eat whatever he is willing, with the knowledge that he may likely aspirate Would discontinue Levophed which is artificially supporting his blood pressure Would change our focus to symptom management as opposed to treatment which meaning ultimately the patient would die from his prior and new illnesses  The family expressed knowledge that the patient was ill prior to coming to the hospital and now has multiple new issues  Currently they're leaning towards comfort measures but are not quite ready to make that decision, we will continue to discuss amongst themselves Patient remain nothing by mouth for now-with same instructions for medications  Palliative care to discuss further with family today Patient remained DNR/DNI Total time spent 60 minutes

## 2015-08-23 NOTE — Care Management (Addendum)
Palliative care consult performed.  Will consider withdrawal of care 3.3.

## 2015-08-23 NOTE — Progress Notes (Signed)
Subjective:  Pt had HD yesterday. Due for HD again today per usual schedule. Family at bedside and updated. Pt will intermittently answer yes/no questions, but not consistently following commands.   Objective:  Vital signs in last 24 hours:  Temp:  [97 F (36.1 C)-99 F (37.2 C)] 97.5 F (36.4 C) (03/01 0810) Pulse Rate:  [52-68] 52 (03/01 0800) Resp:  [11-19] 17 (03/01 0800) BP: (95-150)/(38-91) 116/88 mmHg (03/01 0800) SpO2:  [93 %-100 %] 100 % (03/01 0821) FiO2 (%):  [28 %] 28 % (03/01 0821)  Weight change:  Filed Weights    Intake/Output:    Intake/Output Summary (Last 24 hours) at 08/23/15 0914 Last data filed at 08/23/15 0600  Gross per 24 hour  Intake 180.01 ml  Output   1500 ml  Net -1319.99 ml     Physical Exam: General: Critically ill appearing  HEENT [place]/AT nonrebreather mask on  Neck supple  Pulm/lungs Scattered rhonchi, on NRB  CVS/Heart Paced rhythm, irregular  Abdomen:  Soft, NT, ND  Extremities: + dependent  edema  Neurologic: Awake, alert, not consistently following commands  Skin: B/l UE ecchymoses  Access: Left arm AVF, good bruit       Basic Metabolic Panel:   Recent Labs Lab 08/18/15 0509 08/19/15 0428 08/20/15 0457 08/21/15 0514 08/22/15 0520  NA 140 134* 139 138 140  K 3.8 3.9 4.6 4.2 4.8  CL 96* 92* 94* 94* 95*  CO2 32 GLUCOSE 117* 126* 106* 94 106*  BUN 61* 84* 105* 90* 100*  CREATININE 5.61* 6.96* 8.20* 8.11* 8.96*  CALCIUM 9.2 9.2 10.0 9.4 9.6  MG  --   --   --  2.4 2.5*  PHOS  --   --   --  8.1* 9.2*     CBC:  Recent Labs Lab 08/18/15 0509 08/19/15 0428 08/20/15 0457 08/21/15 0514 08/22/15 0520  WBC 13.3* 14.1* 10.8* 8.8 10.3  HGB 8.4* 8.0* 8.0* 7.6* 7.9*  HCT 26.4* 25.3* 25.0* 23.6* 24.5*  MCV 91.5 89.9 89.6 91.1 91.4  PLT 301 307 285 260 282      Microbiology:  Recent Results (from the past 720 hour(s))  MRSA PCR Screening     Status: None   Collection Time: 08/14/15 12:35 PM   Result Value Ref Range Status   MRSA by PCR NEGATIVE NEGATIVE Final    Comment:        The GeneXpert MRSA Assay (FDA approved for NASAL specimens only), is one component of a comprehensive MRSA colonization surveillance program. It is not intended to diagnose MRSA infection nor to guide or monitor treatment for MRSA infections.   Culture, expectorated sputum-assessment     Status: None   Collection Time: 08/16/15 12:25 AM  Result Value Ref Range Status   Specimen Description SPUTUM  Final   Special Requests Normal  Final   Sputum evaluation THIS SPECIMEN IS ACCEPTABLE FOR SPUTUM CULTURE  Final   Report Status 08/16/2015 FINAL  Final  Culture, respiratory (NON-Expectorated)     Status: None   Collection Time: 08/16/15 12:25 AM  Result Value Ref Range Status   Specimen Description SPUTUM  Final   Special Requests Normal Reflexed from [id number]  Final   Gram Stain   Final    FEW WBC SEEN NO SQUAMOUS EPITHELIAL CELLS PRESENT FEW GRAM POSITIVE RODS RARE GRAM NEGATIVE RODS EXCELLENT SPECIMEN - 90-100% WBCS    Culture LIGHT GROWTH KLEBSIELLA PNEUMONIAE  Final   Report Status 08/19/2015  FINAL  Final   Organism ID, Bacteria KLEBSIELLA PNEUMONIAE  Final      Susceptibility   Klebsiella pneumoniae - MIC*    AMPICILLIN >=32 RESISTANT Resistant     CEFAZOLIN <=4 SENSITIVE Sensitive     CEFEPIME <=1 SENSITIVE Sensitive     CEFTAZIDIME <=1 SENSITIVE Sensitive     CEFTRIAXONE <=1 SENSITIVE Sensitive     CIPROFLOXACIN <=0.25 SENSITIVE Sensitive     GENTAMICIN <=1 SENSITIVE Sensitive     IMIPENEM <=0.25 SENSITIVE Sensitive     TRIMETH/SULFA <=20 SENSITIVE Sensitive     AMPICILLIN/SULBACTAM 4 SENSITIVE Sensitive     PIP/TAZO <=4 SENSITIVE Sensitive     Extended ESBL NEGATIVE Sensitive     * LIGHT GROWTH KLEBSIELLA PNEUMONIAE  Culture, blood (Routine X 2) w Reflex to ID Panel     Status: None   Collection Time: 08/16/15 10:51 AM  Result Value Ref Range Status   Specimen Description  BLOOD RIGHT HAND  Final   Special Requests   Final    BOTTLES DRAWN AEROBIC AND ANAEROBIC ANA AER   Culture NO GROWTH 6 DAYS  Final   Report Status 08/22/2015 FINAL  Final  Culture, blood (Routine X 2) w Reflex to ID Panel     Status: None   Collection Time: 08/16/15 11:09 AM  Result Value Ref Range Status   Specimen Description BLOOD RIGHT HAND  Final   Special Requests BOTTLES DRAWN AEROBIC AND ANAEROBIC  Final   Culture NO GROWTH 6 DAYS  Final   Report Status 08/22/2015 FINAL  Final  Culture, expectorated sputum-assessment     Status: None   Collection Time: 08/16/15 12:15 PM  Result Value Ref Range Status   Specimen Description EXPECTORATED SPUTUM  Final   Special Requests NONE  Final   Sputum evaluation THIS SPECIMEN IS ACCEPTABLE FOR SPUTUM CULTURE  Final   Report Status 08/16/2015 FINAL  Final  Culture, respiratory (NON-Expectorated)     Status: None   Collection Time: 08/16/15 12:15 PM  Result Value Ref Range Status   Specimen Description EXPECTORATED SPUTUM  Final   Special Requests NONE Reflexed from [id number]  Final   Gram Stain   Final    FEW WBC SEEN FEW SQUAMOUS EPITHELIAL CELLS PRESENT MODERATE GRAM POSITIVE RODS FEW GRAM NEGATIVE RODS FEW GRAM POSITIVE COCCI GOOD SPECIMEN - 80-90% WBCS    Culture   Final    HEAVY GROWTH KLEBSIELLA PNEUMONIAE HEAVY GROWTH HAEMOPHILUS PARAINFLUENZAE BETA LACTAMASE NEGATIVE    Report Status 08/19/2015 FINAL  Final   Organism ID, Bacteria KLEBSIELLA PNEUMONIAE  Final      Susceptibility   Klebsiella pneumoniae - MIC*    AMPICILLIN >=32 RESISTANT Resistant     CEFAZOLIN <=4 SENSITIVE Sensitive     CEFEPIME <=1 SENSITIVE Sensitive     CEFTAZIDIME <=1 SENSITIVE Sensitive     CEFTRIAXONE <=1 SENSITIVE Sensitive     CIPROFLOXACIN <=0.25 SENSITIVE Sensitive     GENTAMICIN <=1 SENSITIVE Sensitive     IMIPENEM <=0.25 SENSITIVE Sensitive     TRIMETH/SULFA <=20 SENSITIVE Sensitive     AMPICILLIN/SULBACTAM 4 SENSITIVE  Sensitive     PIP/TAZO <=4 SENSITIVE Sensitive     Extended ESBL NEGATIVE Sensitive     * HEAVY GROWTH KLEBSIELLA PNEUMONIAE    Coagulation Studies: No results for input(s): LABPROT, INR in the last 72 hours.  Urinalysis: No results for input(s): COLORURINE, LABSPEC, PHURINE, GLUCOSEU, HGBUR, BILIRUBINUR, KETONESUR, PROTEINUR, UROBILINOGEN, NITRITE, LEUKOCYTESUR in the last 72  hours.  Invalid input(s): APPERANCEUR    Imaging: Dg Chest Port 1 View  08/22/2015  CLINICAL DATA:  Respiratory failure, status post cardiac arrest EXAM: PORTABLE CHEST 1 VIEW COMPARISON:  Portable chest x-ray of August 21, 2015 FINDINGS: The lungs are adequately inflated. The pulmonary interstitial markings remain increased but have improved slightly since yesterday's study. The cardiac silhouette remains enlarged. The pulmonary vascularity remains engorged but is more distinct. The permanent pacemaker is in stable position. There is no significant pleural effusion and no pneumothorax. IMPRESSION: CHF with mild pulmonary interstitial edema somewhat improved since yesterday's study. Electronically Signed   By: David  [place] M.D.   On: 08/22/2015 07:27     Medications:   . norepinephrine (LEVOPHED) Adult infusion 1.5 mcg/min (08/23/15 0800)   . albuterol  2.5 mg Nebulization Q4H  . antiseptic oral rinse  7 mL Mouth Rinse BID  . aspirin EC  325 mg Oral Daily  . budesonide (PULMICORT) nebulizer solution  0.5 mg Nebulization BID  . carbidopa-levodopa  1 tablet Oral BID  .  ceFAZolin (ANCEF) IV  2 g Intravenous Q M,W,F-HD  . cinacalcet  30 mg Oral Q breakfast  . epoetin (EPOGEN/PROCRIT) injection  10,000 Units Intravenous Q M,W,F-HD  . heparin subcutaneous  5,000 Units Subcutaneous Q12H  . hydrocortisone sod succinate (SOLU-CORTEF) inj  50 mg Intravenous Q6H  . LORazepam  1 mg Intravenous Once  . midodrine  5 mg Oral TID WC  . pantoprazole  40 mg Oral Q1200  . pravastatin  20 mg Oral q1800  . sodium chloride  flush  10-40 mL Intracatheter Q12H   acetaminophen, albuterol, ALPRAZolam, morphine injection, sodium chloride flush  Assessment/ Plan:  82 y.o. male [name] is a 82 y.o. white male with Parkinson's, coronary artery disease, hyperlipidemia, hypertension, GERD,. 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 80% stenosis of the tibioperoneal trunk with peroneal runoff distally which was diffusely diseased. Occlusion of the anterior tibial artery with reconstitution of the anterior tibial artery in the lower leg, hypoxic PEA arrest 08/14/15, Left ankle fracture: ORIF for left ankle. 12/7 Dr. Joice Lofts,   [facility] M[facility] [place] Ditto  1. End Stage Renal Disease  / MWF/  - Patient due for HD again per usual schedule, orders prepared.  Thereafter next planned HD on Friday.  2.  Acute resp failure S/P hypoxic PEA arrest, extubated 08/19/15, has klebsiella pneumonia - Has nonrebreather mask on this AM, appears to be breathing comfortably.  3. Secondary Hyperparathyroidism  - last phos at 9.2, should come down with 2 days of consecutive dialysis.  4. Anemia of chronic kidney disease: hemoglobin 8.0 - Hemoglobin up to 7.9, continue epogen with HD.  5. dependent edema - continue UF with HD.      LOS: 9 Henrique Parekh 3/1/20179:14 AM

## 2015-08-23 NOTE — Progress Notes (Signed)
Post HD TX Assessment 

## 2015-08-23 NOTE — Progress Notes (Signed)
Pre HD TX Assessment 

## 2015-08-23 NOTE — Progress Notes (Signed)
Pre HD TX Machine & Patent checks

## 2015-08-23 NOTE — Progress Notes (Signed)
[facility] - [place] [facility]   PATIENT NAME: [name]    MR#:  [id number]  DATE OF BIRTH:  July 08, 1928  SUBJECTIVE:  Confused, intermittently follows  Patient's son at bedside had discussion as documented earlier REVIEW OF SYSTEMS:  Unable to obtain patient's mental status/medical condition  DRUG ALLERGIES:   Allergies  Allergen Reactions  . Penicillins Swelling    VITALS:  Blood pressure 134/46, pulse 60, temperature 98.2 F (36.8 C), temperature source Axillary, resp. rate 12, height  (1.753 m), weight 96.5 kg (212 lb 11.9 oz), SpO2 100 %.  PHYSICAL EXAMINATION:   VITAL SIGNS: Filed Vitals:   08/23/15 1445 08/23/15 1500  BP: 137/104 134/46  Pulse: 59 60  Temp:    Resp: 14 12   GENERAL:82 y.o.male currently in no acute distress.  chronically ill  HEAD: Normocephalic, atraumatic.  EYES: Pupils equal, round, reactive to light. Extraocular muscles intact. No scleral icterus.  MOUTH: Moist mucosal membrane. Dentition intact. No abscess noted.  EAR, NOSE, THROAT: Clear without exudates. No external lesions.  NECK: Supple. No thyromegaly. No nodules. No JVD.  PULMONARY: diffuse coarse breath sounds without wheeze rails or rhonci. No use of accessory muscles, Good respiratory effort. good air entry bilaterally CHEST: Nontender to palpation.  CARDIOVASCULAR: S1 and S2. Regular rate and rhythm. No murmurs, rubs, or gallop1+dema. Pedal pulses 2+ bilaterally.  GASTROINTESTINAL: Soft, nontender, nondistended. No masses. Positive bowel sounds. No hepatosplenomegaly.  MUSCULOSKELETAL: No swelling, clubbing, or edema. Range of motion full in all extremities.  NEUROLOGIC: spontaneous movement all extremities unable to fully assess secondary to patient's mental status medical condition.  SKIN: No ulceration, lesions, rashes, or cyanosis. Skin warm and dry. Turgor intact.  PSYCHIATRIC: The patient is awake       LABORATORY PANEL:    CBC  Recent Labs Lab 08/22/15 0520  WBC 10.3  HGB 7.9*  HCT 24.5*  PLT 282   ------------------------------------------------------------------------------------------------------------------  Chemistries   Recent Labs Lab 08/22/15 0520  NA 140  K 4.8  CL 95*  CO2 25  GLUCOSE 106*  BUN 100*  CREATININE 8.96*  CALCIUM 9.6  MG 2.5*   ------------------------------------------------------------------------------------------------------------------  Cardiac Enzymes No results for input(s): TROPONINI in the last 168 hours. ------------------------------------------------------------------------------------------------------------------  RADIOLOGY:  Dg Chest Port 1 View  08/22/2015  CLINICAL DATA:  Respiratory failure, status post cardiac arrest EXAM: PORTABLE CHEST 1 VIEW COMPARISON:  Portable chest x-ray of August 21, 2015 FINDINGS: The lungs are adequately inflated. The pulmonary interstitial markings remain increased but have improved slightly since yesterday's study. The cardiac silhouette remains enlarged. The pulmonary vascularity remains engorged but is more distinct. The permanent pacemaker is in stable position. There is no significant pleural effusion and no pneumothorax. IMPRESSION: CHF with mild pulmonary interstitial edema somewhat improved since yesterday's study. Electronically Signed   By: Derin Granquist  [place] M.D.   On: 08/22/2015 07:27    EKG:   Orders placed or performed during the hospital encounter of 08/14/15  . EKG 12-Lead  . EKG 12-Lead    ASSESSMENT AND PLAN:   82 year old gentleman admitted 2/20/17post arrest doing a vascular procedure     1. Acute cardiac arrest. Post PEA arrest.  2. Cardiogenic shockRemains on low-dose Levophed and wean as tolerated Midodrine added  3. End-stage renal disease on hemodialysis. Nephrology to continue with dialysis. 4. Type 2 diabetes- sliding-scale insulin for now 5. History of chronic systolic heart  failure. 6. Peripheral vascular disease with ulcer left medial malleolus. Local wound  care. 7. Anemia of chronic disease on Epogen with dialysis 8. Malnutrition: Speech evaluation appreciated-will need to remain nothing by mouth  Disposition: Palliative care consult pending-meeting scheduled today, already approach this subject comfort measures versus aggressive measures   All the records are reviewed and case discussed with Care Management/Social Workerr. Management plans discussed with the patient, family and they are in agreement.  CODE STATUS: dnr  TOTAL TIME TAKING CARE OF THIS PATIENT: 28 minutes.   POSSIBLE D/C IN 2-3 DAYS, DEPENDING ON CLINICAL CONDITION.   Cashae Weich,  Mardi Mainland.D on 08/23/2015 at 3:18 PM  Between 7am to 6pm - Pager - [phone]  After 6pm: House Pager: - [phone]  [facility]  Office  [phone]  CC: Primary care physician; Lauro Regulus., MD

## 2015-08-23 NOTE — Progress Notes (Signed)
Pot HD Tx Assessment

## 2015-08-23 NOTE — Progress Notes (Signed)
HD TX Initiation 

## 2015-08-23 NOTE — Progress Notes (Signed)
Name: [name] MRN: [id number] DOB: 1929-04-15    ADMISSION DATE:  08/14/2015 DATE OF CONSULTATION: 02/20  HISTORY OF PRESENT ILLNESS: 82 y/o Male  with ESRD, prior CVA, prior MI, ESRD on home HD underwent complex angioplasty to LLE. During procedure had problems with hypoxemia, then suffered PEA arrest requiring intubation and 2 mins ACLS.   SUBJECTIVE: lethargic this morning. On BiPAP last night for OSA dialysis planned for today,  Still on a low dose of levophed Family at bedside, nutritional status is an issue  MAJOR EVENTS/TEST RESULTS: 02/20 Angioplasty to LLE c/b PEA arrest. Admitted to ICU after 2 mins ACLS. Intubated 02/20 LE venous US:  02/20 CT head: NAD 02/20 TTE: LVEF 50-55% 02/22 CT head: NAD 02/23 EEG: This is an abnormal electroencephalogram secondary to general background slowing and the presence of frequent triphasic waves. Triphasic waves are seen most commonly in encephalopathic states 02/25 Extubated  INDWELLING DEVICES:: ETT 02/20 >> 02/25 R femoral CVL 02/20 >>02/26    Microbiology results: MRSA PCR 2/20 >> NEG Blood 02/22 >> NEG Resp 2/22 >> Klebsiella (pansens)  Antimicrobials: Cefepime 2/22 >> 02/25 Cefazolin 02/25 >>   VITAL SIGNS: Temp:  [97 F (36.1 C)-99 F (37.2 C)] 98.2 F (36.8 C) (03/01 1345) Pulse Rate:  [43-151] 59 (03/01 1445) Resp:  [11-19] 14 (03/01 1445) BP: (91-150)/(42-110) 137/104 mmHg (03/01 1445) SpO2:  [83 %-100 %] 100 % (03/01 1445) FiO2 (%):  [28 %] 28 % (03/01 1228) HEMODYNAMICS:   VENTILATOR SETTINGS: Vent Mode:  [-]  FiO2 (%):  [28 %] 28 % INTAKE / OUTPUT:  Intake/Output Summary (Last 24 hours) at 08/23/15 1453 Last data filed at 08/23/15 1330  Gross per 24 hour  Intake 263.55 ml  Output   1500 ml  Net -1236.45 ml     Physical Examination:   VS: BP 137/104 mmHg  Pulse 59  Temp(Src) 98.2 F (36.8 C) (Axillary)  Resp 14  Ht  (1.753 m)  Wt 212 lb 11.9 oz (96.5 kg)  BMI 31.40 kg/m2  SpO2  100%   General Appearance: Lethargic,  no distress  Neuro: Awakens to noxious stimulus; moves all extremities to pain  HEENT: PERRLA, ORAL MUCOSA DRY AND PINK Pulmonary: Diminished breath sounds, coarse rhonchi in bilateral upper lung fileds   Cardiovascular: RRR, S1/S2, no m/r/g.   Abdomen: Normal bowel sounds, soft, non-tender. MS: No visible joint deformities Skin:   warm, no rashes, no ecchymosis  Extremities: normal, no cyanosis, clubbing.   LABS:   LABORATORY PANEL:   CBC  Recent Labs Lab 08/22/15 0520  WBC 10.3  HGB 7.9*  HCT 24.5*  PLT 282    Chemistries   Recent Labs Lab 08/22/15 0520  NA 140  K 4.8  CL 95*  CO2 25  GLUCOSE 106*  BUN 100*  CREATININE 8.96*  CALCIUM 9.6  MG 2.5*  PHOS 9.2*     Recent Labs Lab 08/19/15 1952 08/20/15 0011 08/20/15 0359 08/20/15 0743 08/20/15 1123 08/21/15 2343  GLUCAP 81 83 91 94 87 92    Recent Labs Lab 08/17/15 0500 08/18/15 0437 08/21/15 0930  PHART 7.40 7.46* 7.36  PCO2ART 47 47 49*  PO2ART 114* 92 96   No results for input(s): AST, ALT, ALKPHOS, BILITOT, ALBUMIN in the last 168 hours.  Cardiac Enzymes No results for input(s): TROPONINI in the last 168 hours. CXR 08/19/15: Low volumes, no definite infiltrates  ASSESSMENT / PLAN:  PULMONARY A: VDRF post cardiac arrest s/p extubation 02/25 Passed  SBT 02/25 P:  Supplemental O2 to maintain SpO2 > 90% -patient DNR/DNI Flutter valve and incentive spirometry Q1H WA Chest PT   CARDIOVASCULAR A:  PEA Cardiac arrest 02/20 - etiology unclear Hypotension, improving Ischemic CM Severe PVD P:  MAP goal > 60 mmHg Wean off the Levophed as tolerated Continue midodrine 5 mg tid with meals -started stress dose steroids  RENAL A:  ESRD on home HD P:  Monitor BMET intermittently Monitor I/Os Correct electrolytes as indicated Dialysis M-W-F per nephrology  GASTROINTESTINAL A:  No acute issues P:  GI prophylaxis: IV  PPI -follow speech eval  HEMATOLOGIC A:  Anemia of chronic disease/CKD-Hg trending down P:  DVT px: SQ heparin Monitor CBC intermittently Transfuse per usual guidelines  INFECTIOUS A:  Fever-resolved Possible PNA - Klebsiella in respiratory culture P:  Monitor temp, WBC count Micro and abx as above Continue Cefazolin for klebsiella pneumonia  ENDOCRINE A:  Hyperglycemia, minimal Risk of hypoglycemia after TFs stopped P:  Monitor for hypoglycemia  NEUROLOGIC A:  Post anoxic encephalopathy - markedly improved Very HOH P:  RASS goal: 0 Minimize sedating medications PT/OT evaluation and treatment     The Patient requires high complexity decision making for assessment and support, frequent evaluation and titration of therapies.  Patient/Family are satisfied with Plan of action and management. All questions answered, follow up palliative care consult  Lucie Leather, M.D.  [facility]  Medical Director [facility] Medical Director [facility] Cardio-Pulmonary Department

## 2015-08-23 NOTE — Progress Notes (Signed)
PT Cancellation Note  Patient Details Name: [name] MRN: [id number] DOB: Jun 21, 1929   Cancelled Treatment:    Reason Eval/Treat Not Completed: Medical issues which prohibited therapy (Discussed patient with attending physician [facility]).  Family now leaning towards comfort care measures, pending additional discussion with palliative care team.  Will complete order at this time; please re-consult should needs changes.)   Lenae Wherley H. Manson Passey, PT, DPT, NCS 08/23/2015, 10:39 AM [phone]

## 2015-08-23 NOTE — Progress Notes (Signed)
Pharmacy Antibiotic Note - Day 7  [name] is a 82 y.o. male admitted on 08/14/2015 with pneumonia.  Pharmacy has been consulted for cefazolin dosing for heavy growth klebsiella. Patient receives regularly scheduled HD MWf. Patient to have dialysis 2/28 and 3/1.   Plan: Continue cefazolin 2g IV qHD session.   Height:  (175.3 cm) Weight:  (Bedscale broken) IBW/kg (Calculated) : 70.7  Temp (24hrs), Avg:97.9 F (36.6 C), Min:97 F (36.1 C), Max:99 F (37.2 C)   Recent Labs Lab 08/18/15 0509 08/19/15 0428 08/20/15 0457 08/21/15 0514 08/22/15 0520  WBC 13.3* 14.1* 10.8* 8.8 10.3  CREATININE 5.61* 6.96* 8.20* 8.11* 8.96*    Estimated Creatinine Clearance: 7 mL/min (by C-G formula based on Cr of 8.96).    Allergies  Allergen Reactions  . Penicillins Swelling    Antimicrobials this admission: Cefepime 2/22 >> 2/25 Cefazolin 2/25 >>   Microbiology results: 2/22 BCx x 2: negative 2/22 Sputum: heavy growth gram negative rods, Klebsiella   2/20 MRSA PCR: negative   Pharmacy will continue to monitor and adjust per consult.    Luisa Hart D 08/23/2015 3:05 PM

## 2015-08-23 NOTE — Progress Notes (Signed)
HDTx Terminaton

## 2015-08-23 NOTE — Progress Notes (Signed)
Communicated with speech, plan to cont Meds with applesauce, keep pt NPO other-wise, speech assessed pt pockets food after cont eating, speech will  re-assess daily, nursing will cont to monitor, MD communicated with as well

## 2015-08-24 DIAGNOSIS — H9190 Unspecified hearing loss, unspecified ear: Secondary | ICD-10-CM

## 2015-08-24 DIAGNOSIS — J9621 Acute and chronic respiratory failure with hypoxia: Secondary | ICD-10-CM

## 2015-08-24 DIAGNOSIS — D638 Anemia in other chronic diseases classified elsewhere: Secondary | ICD-10-CM

## 2015-08-24 DIAGNOSIS — E785 Hyperlipidemia, unspecified: Secondary | ICD-10-CM

## 2015-08-24 DIAGNOSIS — G931 Anoxic brain damage, not elsewhere classified: Secondary | ICD-10-CM

## 2015-08-24 DIAGNOSIS — E46 Unspecified protein-calorie malnutrition: Secondary | ICD-10-CM

## 2015-08-24 DIAGNOSIS — Z79899 Other long term (current) drug therapy: Secondary | ICD-10-CM

## 2015-08-24 DIAGNOSIS — J9600 Acute respiratory failure, unspecified whether with hypoxia or hypercapnia: Secondary | ICD-10-CM | POA: Insufficient documentation

## 2015-08-24 DIAGNOSIS — E119 Type 2 diabetes mellitus without complications: Secondary | ICD-10-CM

## 2015-08-24 DIAGNOSIS — Z515 Encounter for palliative care: Secondary | ICD-10-CM

## 2015-08-24 DIAGNOSIS — I739 Peripheral vascular disease, unspecified: Secondary | ICD-10-CM

## 2015-08-24 DIAGNOSIS — I868 Varicose veins of other specified sites: Secondary | ICD-10-CM

## 2015-08-24 DIAGNOSIS — Z87891 Personal history of nicotine dependence: Secondary | ICD-10-CM

## 2015-08-24 DIAGNOSIS — I959 Hypotension, unspecified: Secondary | ICD-10-CM

## 2015-08-24 DIAGNOSIS — R1310 Dysphagia, unspecified: Secondary | ICD-10-CM

## 2015-08-24 DIAGNOSIS — I499 Cardiac arrhythmia, unspecified: Secondary | ICD-10-CM

## 2015-08-24 DIAGNOSIS — J189 Pneumonia, unspecified organism: Secondary | ICD-10-CM

## 2015-08-24 DIAGNOSIS — I5022 Chronic systolic (congestive) heart failure: Secondary | ICD-10-CM

## 2015-08-24 DIAGNOSIS — G20 Parkinson's disease: Secondary | ICD-10-CM

## 2015-08-24 DIAGNOSIS — Z8673 Personal history of transient ischemic attack (TIA), and cerebral infarction without residual deficits: Secondary | ICD-10-CM

## 2015-08-24 DIAGNOSIS — G4730 Sleep apnea, unspecified: Secondary | ICD-10-CM

## 2015-08-24 MED ORDER — LORAZEPAM 2 MG/ML IJ SOLN: 1.0000 mg | Freq: Once | INTRAMUSCULAR | Status: AC

## 2015-08-24 NOTE — Progress Notes (Signed)
Nutrition Brief Note  Chart reviewed. Plan is for pt to transition to comfort care measures. Pt currently NPO, family does not want feeding tube, no NG or dobhoff in place at present. No further nutrition interventions warranted at this time due to poc. Will sign off. Please re-consult as needed.   Romelle Starcher MS, RD, LDN [phone] Pager  [phone] Weekend/On-Call Pager

## 2015-08-24 NOTE — Progress Notes (Signed)
[facility] - [place] at [facility]   PATIENT NAME: [name]    MR#:  [id number]  DATE OF BIRTH:  1929-06-14  SUBJECTIVE:  Confused, intermittently follows REVIEW OF SYSTEMS:  Unable to obtain patient's mental status/medical condition  DRUG ALLERGIES:   Allergies  Allergen Reactions  . Penicillins Swelling    VITALS:  Blood pressure 120/64, pulse 81, temperature 98.7 F (37.1 C), temperature source Oral, resp. rate 15, height  (1.753 m), weight 212 lb 11.9 oz (96.5 kg), SpO2 100 %.  PHYSICAL EXAMINATION:   VITAL SIGNS: Filed Vitals:   08/24/15 1330 08/24/15 1400  BP: 117/49 120/64  Pulse: 60 81  Temp:  98.7 F (37.1 C)  Resp: 14 15   GENERAL:82 y.o.male currently in no acute distress.  chronically ill  HEAD: Normocephalic, atraumatic.  EYES: Pupils equal, round, reactive to light. Extraocular muscles intact. No scleral icterus.  MOUTH: Moist mucosal membrane. Dentition intact. No abscess noted.  EAR, NOSE, THROAT: Clear without exudates. No external lesions.  NECK: Supple. No thyromegaly. No nodules. No JVD.  PULMONARY: diffuse coarse breath sounds without wheeze rails or rhonci. No use of accessory muscles, Good respiratory effort. good air entry bilaterally CHEST: Nontender to palpation.  CARDIOVASCULAR: S1 and S2. Regular rate and rhythm. No murmurs, rubs, or gallop1+dema. Pedal pulses 2+ bilaterally.  GASTROINTESTINAL: Soft, nontender, nondistended. No masses. Positive bowel sounds. No hepatosplenomegaly.  MUSCULOSKELETAL: No swelling, clubbing, or edema. Range of motion full in all extremities.  NEUROLOGIC: spontaneous movement all extremities unable to fully assess secondary to patient's mental status medical condition.  SKIN: No ulceration, lesions, rashes, or cyanosis. Skin warm and dry. Turgor intact.  PSYCHIATRIC: The patient is awake       LABORATORY PANEL:   CBC  Recent Labs Lab 08/22/15 0520  WBC 10.3  HGB  7.9*  HCT 24.5*  PLT 282   ------------------------------------------------------------------------------------------------------------------  Chemistries   Recent Labs Lab 08/22/15 0520  NA 140  K 4.8  CL 95*  CO2 25  GLUCOSE 106*  BUN 100*  CREATININE 8.96*  CALCIUM 9.6  MG 2.5*   ------------------------------------------------------------------------------------------------------------------  Cardiac Enzymes No results for input(s): TROPONINI in the last 168 hours. ------------------------------------------------------------------------------------------------------------------  RADIOLOGY:  No results found.  EKG:   Orders placed or performed during the hospital encounter of 08/14/15  . EKG 12-Lead  . EKG 12-Lead    ASSESSMENT AND PLAN:   82 year old gentleman admitted 2/20/17post arrest doing a vascular procedure     1. Acute cardiac arrest. Post PEA arrest.  2. Cardiogenic shockRemains on low-dose Levophed and wean as tolerated Midodrine added  3. End-stage renal disease on hemodialysis. Nephrology to continue with dialysis. 4. Type 2 diabetes- sliding-scale insulin for now 5. History of chronic systolic heart failure. 6. Peripheral vascular disease with ulcer left medial malleolus. Local wound care. 7. Anemia of chronic disease on Epogen with dialysis 8. Malnutrition: Speech evaluation appreciated-will need to remain nothing by mouth  Disposition: Transition to comfort measures   All the records are reviewed and case discussed with Care Management/Social Workerr. Management plans discussed with the patient, family and they are in agreement.  CODE STATUS: dnr  TOTAL TIME TAKING CARE OF THIS PATIENT: 28 minutes.   POSSIBLE D/C IN 2-3 DAYS, DEPENDING ON CLINICAL CONDITION.   Arianah Torgeson,  Mardi Mainland.D on 08/24/2015 at 3:35 PM  Between 7am to 6pm - Pager - [phone]  After 6pm: House Pager: - [phone]  [facility]  Office  [phone]  CC: Primary care physician; Kirk Ruths., MD

## 2015-08-24 NOTE — Progress Notes (Signed)
Subjective:  Pt had HD yesterday. Pt will intermittently answer yes/no questions, but not consistently following commands.   Objective:  Vital signs in last 24 hours:  Temp:  [98.2 F (36.8 C)-98.7 F (37.1 C)] 98.4 F (36.9 C) (03/02 0430) Pulse Rate:  [43-151] 62 (03/02 0830) Resp:  [12-23] 16 (03/02 0830) BP: (81-142)/(39-116) 129/85 mmHg (03/02 0830) SpO2:  [83 %-100 %] 100 % (03/02 0858) FiO2 (%):  [28 %] 28 % (03/01 1538)  Weight change:  Filed Weights    Intake/Output:    Intake/Output Summary (Last 24 hours) at 08/24/15 1007 Last data filed at 08/24/15 0600  Gross per 24 hour  Intake 271.46 ml  Output   1500 ml  Net -1228.54 ml     Physical Exam: General: Critically ill appearing  HEENT [place] oxygen  Neck supple  Pulm/lungs Scattered rhonchi,    CVS/Heart Paced rhythm, irregular  Abdomen:  Soft, NT, ND  Extremities: + dependent  edema  Neurologic: Awake, alert, not consistently following commands  Skin: B/l UE ecchymoses  Access: Left arm AVF, good bruit       Basic Metabolic Panel:   Recent Labs Lab 08/18/15 0509 08/19/15 0428 08/20/15 0457 08/21/15 0514 08/22/15 0520  NA 140 134* 139 138 140  K 3.8 3.9 4.6 4.2 4.8  CL 96* 92* 94* 94* 95*  CO2 32 GLUCOSE 117* 126* 106* 94 106*  BUN 61* 84* 105* 90* 100*  CREATININE 5.61* 6.96* 8.20* 8.11* 8.96*  CALCIUM 9.2 9.2 10.0 9.4 9.6  MG  --   --   --  2.4 2.5*  PHOS  --   --   --  8.1* 9.2*     CBC:  Recent Labs Lab 08/18/15 0509 08/19/15 0428 08/20/15 0457 08/21/15 0514 08/22/15 0520  WBC 13.3* 14.1* 10.8* 8.8 10.3  HGB 8.4* 8.0* 8.0* 7.6* 7.9*  HCT 26.4* 25.3* 25.0* 23.6* 24.5*  MCV 91.5 89.9 89.6 91.1 91.4  PLT 301 307 285 260 282      Microbiology:  Recent Results (from the past 720 hour(s))  MRSA PCR Screening     Status: None   Collection Time: 08/14/15 12:35 PM  Result Value Ref Range Status   MRSA by PCR NEGATIVE NEGATIVE Final    Comment:        The  GeneXpert MRSA Assay (FDA approved for NASAL specimens only), is one component of a comprehensive MRSA colonization surveillance program. It is not intended to diagnose MRSA infection nor to guide or monitor treatment for MRSA infections.   Culture, expectorated sputum-assessment     Status: None   Collection Time: 08/16/15 12:25 AM  Result Value Ref Range Status   Specimen Description SPUTUM  Final   Special Requests Normal  Final   Sputum evaluation THIS SPECIMEN IS ACCEPTABLE FOR SPUTUM CULTURE  Final   Report Status 08/16/2015 FINAL  Final  Culture, respiratory (NON-Expectorated)     Status: None   Collection Time: 08/16/15 12:25 AM  Result Value Ref Range Status   Specimen Description SPUTUM  Final   Special Requests Normal Reflexed from [id number]  Final   Gram Stain   Final    FEW WBC SEEN NO SQUAMOUS EPITHELIAL CELLS PRESENT FEW GRAM POSITIVE RODS RARE GRAM NEGATIVE RODS EXCELLENT SPECIMEN - 90-100% WBCS    Culture LIGHT GROWTH KLEBSIELLA PNEUMONIAE  Final   Report Status 08/19/2015 FINAL  Final   Organism ID, Bacteria KLEBSIELLA PNEUMONIAE  Final  Susceptibility   Klebsiella pneumoniae - MIC*    AMPICILLIN >=32 RESISTANT Resistant     CEFAZOLIN <=4 SENSITIVE Sensitive     CEFEPIME <=1 SENSITIVE Sensitive     CEFTAZIDIME <=1 SENSITIVE Sensitive     CEFTRIAXONE <=1 SENSITIVE Sensitive     CIPROFLOXACIN <=0.25 SENSITIVE Sensitive     GENTAMICIN <=1 SENSITIVE Sensitive     IMIPENEM <=0.25 SENSITIVE Sensitive     TRIMETH/SULFA <=20 SENSITIVE Sensitive     AMPICILLIN/SULBACTAM 4 SENSITIVE Sensitive     PIP/TAZO <=4 SENSITIVE Sensitive     Extended ESBL NEGATIVE Sensitive     * LIGHT GROWTH KLEBSIELLA PNEUMONIAE  Culture, blood (Routine X 2) w Reflex to ID Panel     Status: None   Collection Time: 08/16/15 10:51 AM  Result Value Ref Range Status   Specimen Description BLOOD RIGHT HAND  Final   Special Requests   Final    BOTTLES DRAWN AEROBIC AND ANAEROBIC ANA  AER   Culture NO GROWTH 6 DAYS  Final   Report Status 08/22/2015 FINAL  Final  Culture, blood (Routine X 2) w Reflex to ID Panel     Status: None   Collection Time: 08/16/15 11:09 AM  Result Value Ref Range Status   Specimen Description BLOOD RIGHT HAND  Final   Special Requests BOTTLES DRAWN AEROBIC AND ANAEROBIC  Final   Culture NO GROWTH 6 DAYS  Final   Report Status 08/22/2015 FINAL  Final  Culture, expectorated sputum-assessment     Status: None   Collection Time: 08/16/15 12:15 PM  Result Value Ref Range Status   Specimen Description EXPECTORATED SPUTUM  Final   Special Requests NONE  Final   Sputum evaluation THIS SPECIMEN IS ACCEPTABLE FOR SPUTUM CULTURE  Final   Report Status 08/16/2015 FINAL  Final  Culture, respiratory (NON-Expectorated)     Status: None   Collection Time: 08/16/15 12:15 PM  Result Value Ref Range Status   Specimen Description EXPECTORATED SPUTUM  Final   Special Requests NONE Reflexed from [id number]  Final   Gram Stain   Final    FEW WBC SEEN FEW SQUAMOUS EPITHELIAL CELLS PRESENT MODERATE GRAM POSITIVE RODS FEW GRAM NEGATIVE RODS FEW GRAM POSITIVE COCCI GOOD SPECIMEN - 80-90% WBCS    Culture   Final    HEAVY GROWTH KLEBSIELLA PNEUMONIAE HEAVY GROWTH HAEMOPHILUS PARAINFLUENZAE BETA LACTAMASE NEGATIVE    Report Status 08/19/2015 FINAL  Final   Organism ID, Bacteria KLEBSIELLA PNEUMONIAE  Final      Susceptibility   Klebsiella pneumoniae - MIC*    AMPICILLIN >=32 RESISTANT Resistant     CEFAZOLIN <=4 SENSITIVE Sensitive     CEFEPIME <=1 SENSITIVE Sensitive     CEFTAZIDIME <=1 SENSITIVE Sensitive     CEFTRIAXONE <=1 SENSITIVE Sensitive     CIPROFLOXACIN <=0.25 SENSITIVE Sensitive     GENTAMICIN <=1 SENSITIVE Sensitive     IMIPENEM <=0.25 SENSITIVE Sensitive     TRIMETH/SULFA <=20 SENSITIVE Sensitive     AMPICILLIN/SULBACTAM 4 SENSITIVE Sensitive     PIP/TAZO <=4 SENSITIVE Sensitive     Extended ESBL NEGATIVE Sensitive     * HEAVY  GROWTH KLEBSIELLA PNEUMONIAE    Coagulation Studies: No results for input(s): LABPROT, INR in the last 72 hours.  Urinalysis: No results for input(s): COLORURINE, LABSPEC, PHURINE, GLUCOSEU, HGBUR, BILIRUBINUR, KETONESUR, PROTEINUR, UROBILINOGEN, NITRITE, LEUKOCYTESUR in the last 72 hours.  Invalid input(s): APPERANCEUR    Imaging: No results found.   Medications:   .  norepinephrine (LEVOPHED) Adult infusion 2 mcg/min (08/23/15 1800)   . albuterol  2.5 mg Nebulization Q4H  . antiseptic oral rinse  7 mL Mouth Rinse BID  . aspirin EC  325 mg Oral Daily  . budesonide (PULMICORT) nebulizer solution  0.5 mg Nebulization BID  . carbidopa-levodopa  1 tablet Oral BID  . cinacalcet  30 mg Oral Q breakfast  . epoetin (EPOGEN/PROCRIT) injection  10,000 Units Intravenous Q M,W,F-HD  . heparin subcutaneous  5,000 Units Subcutaneous Q12H  . hydrocortisone sod succinate (SOLU-CORTEF) inj  50 mg Intravenous Q6H  . LORazepam  1 mg Intravenous Once  . midodrine  5 mg Oral TID WC  . pantoprazole  40 mg Oral Q1200  . pravastatin  20 mg Oral q1800  . sodium chloride flush  10-40 mL Intracatheter Q12H   acetaminophen, albuterol, ALPRAZolam, morphine injection, sodium chloride flush  Assessment/ Plan:  82 y.o. male [name] is a 82 y.o. white male with Parkinson's, coronary artery disease, hyperlipidemia, hypertension, GERD,. 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 80% stenosis of the tibioperoneal trunk with peroneal runoff distally which was diffusely diseased. Occlusion of the anterior tibial artery with reconstitution of the anterior tibial artery in the lower leg, hypoxic PEA arrest 08/14/15, Left ankle fracture: ORIF for left ankle. 12/7 Dr. Joice Lofts,   [facility] M[facility] Ditto  1. End Stage Renal Disease  / MWF/   - Patient has failed swallow study and family decided against PEG due to his multiple comorbidities. - Expected to transition to hospice tomorrow - No further HD  2.  Acute resp failure S/P hypoxic PEA arrest, extubated 08/19/15, has klebsiella pneumonia -   appears to be breathing comfortably with [place] O2  3. dependent edema       LOS: 10 [name] 3/2/201710:07 AM

## 2015-08-24 NOTE — Progress Notes (Signed)
Pharmacy Antibiotic Note - Day 7  [name] is a 82 y.o. male admitted on 08/14/2015 with pneumonia.  Pharmacy has been consulted for cefazolin dosing for heavy growth klebsiella. Patient receives regularly scheduled HD MWf. Patient to have dialysis 2/28 and 3/1.   Plan: Patient with last dose of cefazolin 2 g yesterday with HD. Per nephrology note, no more dialysis as patient will likely be transitioned to hospice care. Will f/u need for continued dosing of cefazolin but it will likely be d/c with comfort care.    Height:  (175.3 cm) Weight:  (Bedscale broken) IBW/kg (Calculated) : 70.7  Temp (24hrs), Avg:98.4 F (36.9 C), Min:98.2 F (36.8 C), Max:98.7 F (37.1 C)   Recent Labs Lab 08/18/15 0509 08/19/15 0428 08/20/15 0457 08/21/15 0514 08/22/15 0520  WBC 13.3* 14.1* 10.8* 8.8 10.3  CREATININE 5.61* 6.96* 8.20* 8.11* 8.96*    Estimated Creatinine Clearance: 7 mL/min (by C-G formula based on Cr of 8.96).    Allergies  Allergen Reactions  . Penicillins Swelling    Antimicrobials this admission: Cefepime 2/22 >> 2/25 Cefazolin 2/25 >>   Microbiology results: 2/22 BCx x 2: negative 2/22 Sputum: heavy growth gram negative rods, Klebsiella   2/20 MRSA PCR: negative   Pharmacy will continue to monitor and adjust per consult.    Luisa Hart D 08/24/2015 11:47 AM

## 2015-08-24 NOTE — Progress Notes (Signed)
Palliative Medicine Inpatient Consult Follow Up Note   Name: [name] Date: 08/24/2015 MRN: [id number]  DOB: [birth year]  Referring Physician: Lytle Butte, MD  Palliative Care consult requested for this 82 y.o. male for goals of medical therapy in patient with acute on chronic respiratory failure.  TODAY'S DISCUSSIONS AND DECISIONS: I met with pt's son and we confirmed that the plan is for discontinuation of levophed tomorrow if not before.  Pt's nonessential meds might be DCd before then. I spoke with nephrologist and with critical care physician.    BRIEF HISTORY: Pt is an 82 yo man with a known history of end-stage renal disease on hemodialysis as well as Parkinson's disease and PAD, who presented for an elective angiogram on 08/14/15. He had wheezing and coughing on arrival and was placed on a Nonrebreather. He had sats decreasing near the end of the procedure and went into PEA arrest. He was intubated and epi was given and CPR was started. After 2 minutes, a pulse returned and a dopamine infusion was started. He was extubated on 08/19/15. He continues on low dose Levophed as he cannot tolerate HD without this. His cognition has worsened and he is felt to have anoxic injury. He now speaks some, but he cannot appreciate the complexity of his condition, etc. Son is Anthonly Nicole Blackburn) and wife is [name]. [name], [name], is also involved in care.   I met with family on the evening of 08/23/15.  We discussed pts inability to eat and how that affects all other decisions. We reviewed his history and his potential quality of life if aggressive care was to continue.  Son and wife feel pt would not want to go on with dialysis at this point--because he would never want a feeding tube and he would have to have one to go on living. He would then have a PEG tube and dailysis and he still would not have his mind back like it was.  They asked for comfort care measures after all the facts were reviewed  one by one.    Son, [name], has a birthday on 3/2, so he asked if we could change pt to comfort terminal care on 3/3 instead of on 3/2.  I agreed that this is certainly an important consideration. We will therefore stop HD and then wean away nonessential meds and then stop the Levophed on Friday (if it is not DCd before then).  The primary conditions which will lead to inevitable death within 2-3 weeks time will be end stage renal disease with dialysis stopped and ALSO, severe dysphagia with aspiration and with no desire for a feeding tube.   IF pt remains stable off of Levophed, then family would like him to go to  [facility].  If he is unstable, he will die here due to not being safe for transportation.  We will have to see how this goes on Friday morning (most likely --unless we can stop the Levo before then).     IMPRESSION Acute cardi-resp failure with hypoxia --s/p CPR for 2 min in-house --still on pressor support due to cardiogenic shock and due to the hypotension of HD --intubated and then extubated  Hypotension ESRD on home HD PAD with recent angiogram --Vascular ulcer to left medial malleolus H/O CVA CAD w/ h/o MI Parkinson's Dz Arrythmia with h/o pacemeaker insertion Former Smoker DM2 OSA --used CPAP Chronic Systolic CHF Anemia of chronic disease  Klebsiella infection --pulmonary Post code anoxic encephalopathy ---markedly improved  Pt is very Hard of Hearing Malnutrition --currently has dobbhoff tube for nutrition and meds Dysphagia --severe and now NPO (since 2/27) Dyslipidemia    REVIEW OF SYSTEMS:  Patient is not able to provide ROS due to confusion. He can answer some yes/no questions but his answers are not reliably accurate  CODE STATUS: DNR   PAST MEDICAL HISTORY: Past Medical History  Diagnosis Date  . Renal disorder   . Stroke ([place])   . Coronary artery disease   . Myocardial infarction ([place])   . Parkinson disease ([place])   . Chronic home  hemodialysis status ([place])     PAST SURGICAL HISTORY:  Past Surgical History  Procedure Laterality Date  . Pacemaker insertion    . Orif ankle fracture Left 05/31/2015    Procedure: OPEN REDUCTION INTERNAL FIXATION TRIMALLEOLAR IANKLE FRACTURE DISLOCATION;  Surgeon: Corky Mull, MD;  Location: [facility] ORS;  Service: Orthopedics;  Laterality: Left;  . Peripheral vascular catheterization N/A 07/27/2015    Procedure: A/V Shuntogram/Fistulagram;  Surgeon: Algernon Huxley, MD;  Location: [place] [facility];  Service: Cardiovascular;  Laterality: N/A;  . Peripheral vascular catheterization N/A 07/27/2015    Procedure: A/V Shunt Intervention;  Surgeon: Algernon Huxley, MD;  Location: [place] [facility];  Service: Cardiovascular;  Laterality: N/A;  . Peripheral vascular catheterization N/A 08/14/2015    Procedure: Abdominal Aortogram w/Lower Extremity;  Surgeon: Algernon Huxley, MD;  Location: [place] [facility];  Service: Cardiovascular;  Laterality: N/A;  . Peripheral vascular catheterization  08/14/2015    Procedure: Lower Extremity Intervention;  Surgeon: Algernon Huxley, MD;  Location: [place] [facility];  Service: Cardiovascular;;    Vital Signs: BP 129/85 mmHg  Pulse 62  Temp(Src) 98.4 F (36.9 C) (Oral)  Resp 16  Ht _0  (1.753 m)  Wt 96.5 kg (212 lb 11.9 oz)  BMI 31.40 kg/m2  SpO2 100% Filed Weights    Estimated body mass index is 31.4 kg/(m^2) as calculated from the following:   Height as of this encounter: _1  (1.753 m).   Weight as of this encounter: 96.5 kg (212 lb 11.9 oz).  PHYSICAL EXAM: Awake but not making sense EOMI OP clear No JVD or TM Hrt rrr no mgr Lungs with some ronchi Abd soft and NT Ext no mottling or cyanosis Left foot dressed  LABS: CBC:    Component Value Date/Time   WBC 10.3 08/22/2015 0520   WBC 14.6* 01/11/2014 1412   HGB 7.9* 08/22/2015 0520   HGB 12.2* 01/11/2014 1412   HCT 24.5* 08/22/2015 0520   HCT 38.4* 01/11/2014 1412   PLT 282 08/22/2015 0520    PLT 203 01/11/2014 1412   MCV 91.4 08/22/2015 0520   MCV 102* 01/11/2014 1412   NEUTROABS 23.0* 08/14/2015 1235   NEUTROABS 13.3* 01/11/2014 1412   LYMPHSABS 1.2 08/14/2015 1235   LYMPHSABS 0.8* 01/11/2014 1412   MONOABS 0.7 08/14/2015 1235   MONOABS 0.4 01/11/2014 1412   EOSABS 0.1 08/14/2015 1235   EOSABS 0.0 01/11/2014 1412   BASOSABS 0.0 08/14/2015 1235   BASOSABS 0.0 01/11/2014 1412   Comprehensive Metabolic Panel:    More than 50% of the visit was spent in counseling/coordination of care: YES  Time Spent:  35 min

## 2015-08-25 LAB — CBC
HEMATOCRIT: 27.6 % — AB (ref 40.0–52.0)
HEMOGLOBIN: 8.7 g/dL — AB (ref 13.0–18.0)
MCH: 28.2 pg (ref 26.0–34.0)
MCHC: 31.5 g/dL — AB (ref 32.0–36.0)
MCV: 89.7 fL (ref 80.0–100.0)
Platelets: 342 10*3/uL (ref 150–440)
RBC: 3.07 MIL/uL — ABNORMAL LOW (ref 4.40–5.90)
RDW: 16.6 % — AB (ref 11.5–14.5)
WBC: 9.7 10*3/uL (ref 3.8–10.6)

## 2015-08-25 MED ORDER — PROCHLORPERAZINE 25 MG RE SUPP
25.0000 mg | Freq: Three times a day (TID) | RECTAL | Status: DC | PRN
  Filled 2015-08-25: qty 1

## 2015-08-25 MED ORDER — BISACODYL 10 MG RE SUPP: 10.0000 mg | Freq: Every day | RECTAL | Status: AC | PRN

## 2015-08-25 MED ORDER — MORPHINE SULFATE (PF) 4 MG/ML IV SOLN: 4.0000 mg | INTRAVENOUS | Status: DC | PRN

## 2015-08-25 MED ORDER — LORAZEPAM 0.5 MG PO TABS: 0.5000 mg | ORAL_TABLET | ORAL | Status: AC | PRN

## 2015-08-25 MED ORDER — BISACODYL 10 MG RE SUPP: 10.0000 mg | Freq: Every day | RECTAL | Status: DC | PRN

## 2015-08-25 MED ORDER — MORPHINE SULFATE (PF) 2 MG/ML IV SOLN: 2.0000 mg | INTRAVENOUS | Status: DC | PRN

## 2015-08-25 MED ORDER — MORPHINE SULFATE (CONCENTRATE) 10 MG/0.5ML PO SOLN: 5.0000 mg | ORAL | Status: AC | PRN

## 2015-08-25 MED ORDER — MORPHINE SULFATE (CONCENTRATE) 10 MG/0.5ML PO SOLN
5.0000 mg | ORAL | Status: DC | PRN
  Administered 2015-08-25: 5 mg via SUBLINGUAL
  Filled 2015-08-25: qty 1

## 2015-08-25 MED ORDER — LORAZEPAM 2 MG/ML IJ SOLN
0.5000 mg | INTRAMUSCULAR | Status: DC | PRN
  Administered 2015-08-25: 0.5 mg via INTRAVENOUS
  Filled 2015-08-25: qty 1

## 2015-08-25 MED ORDER — PROCHLORPERAZINE 25 MG RE SUPP: 25.0000 mg | Freq: Three times a day (TID) | RECTAL | Status: AC | PRN

## 2015-08-25 NOTE — Progress Notes (Signed)
Patient left ICU by stretcher with [facility] and going [facility].  Patient alert with no distress noted when leaving ICU.

## 2015-08-25 NOTE — Discharge Summary (Signed)
[facility] - [place] at [facility]   PATIENT NAME: [name]    MR#:  [id number]  DATE OF BIRTH:  07-26-1928  DATE OF ADMISSION:  08/14/2015 ADMITTING PHYSICIAN: Annice Needy, MD  DATE OF DISCHARGE: No discharge date for patient encounter.  PRIMARY CARE PHYSICIAN: Lauro Regulus., MD    ADMISSION DIAGNOSIS:  LT lower extremity angio   ASO w ulceration  DISCHARGE DIAGNOSIS:  Active Problems:   Cardiac arrest (HCC)   Dyspnea   Respiratory arrest (HCC)   Pressure ulcer   Acute respiratory failure (HCC)   SECONDARY DIAGNOSIS:   Past Medical History  Diagnosis Date  . Renal disorder   . Stroke (HCC)   . Coronary artery disease   . Myocardial infarction (HCC)   . Parkinson disease (HCC)   . Chronic home hemodialysis status (HCC)    end-stage renal disease on hemodialysis  HOSPITAL COURSE:  [name]  is a 82 y.o. male admitted 08/14/2015 presented for lower extremity angiography. Patient suffered cardiac arrest. After a rather prolonged course the patient was eventually able to be extubated successfully on 07/31/15. However, he has not become alert enough with return of mental status for adequate nutrition. He will has been evaluated by speech and deemed him strict aspiration precaution. Given his extensive hospital stay, likely neurological damage that occurred post arrest long discussions were made with myself, palliative care, the family about making the patient comfort measures only. He is taken off of Levophed came hypotensive but stabilized to be transitioned to hospice house per the discretion of palliative care  DISCHARGE CONDITIONS:   Hospice  CONSULTS OBTAINED:  Treatment Team:  Alwyn Pea, MD Dalia Heading, MD Kym Groom, MD Thana Farr, MD  DRUG ALLERGIES:   Allergies  Allergen Reactions  . Penicillins Swelling    DISCHARGE MEDICATIONS:   Current Discharge Medication List    START taking these  medications   Details  bisacodyl (DULCOLAX) 10 MG suppository Place 1 suppository (10 mg total) rectally daily as needed for moderate constipation. Qty: 6 suppository, Refills: 0    LORazepam (ATIVAN) 0.5 MG tablet Take 1 tablet (0.5 mg total) by mouth every 2 (two) hours as needed for anxiety. Qty: 15 tablet, Refills: 0    Morphine Sulfate (MORPHINE CONCENTRATE) 10 MG/0.5ML SOLN concentrated solution Place 0.25 mLs (5 mg total) under the tongue every 2 (two) hours as needed for moderate pain or shortness of breath. Qty: 30 mL, Refills: 0    prochlorperazine (COMPAZINE) 25 MG suppository Place 1 suppository (25 mg total) rectally every 8 (eight) hours as needed for nausea or vomiting. Qty: 6 suppository, Refills: 0      STOP taking these medications     aspirin EC 325 MG tablet      calcium acetate (PHOSLO) 667 MG capsule      carbidopa-levodopa (SINEMET IR) 25-100 MG tablet      carvedilol (COREG) 3.125 MG tablet      collagenase (SANTYL) ointment      cyanocobalamin 1000 MCG tablet      guaifenesin (ROBITUSSIN) 100 MG/5ML syrup      lovastatin (MEVACOR) 20 MG tablet      midodrine (PROAMATINE) 10 MG tablet      Multiple Vitamins-Minerals (RENAPLEX-D PO)      Nutritional Supplements (FEEDING SUPPLEMENT, NEPRO CARB STEADY,) LIQD      omeprazole (PRILOSEC) 40 MG capsule      oxyCODONE (OXY IR/ROXICODONE) 5 MG immediate release tablet  SENSIPAR 30 MG tablet      azithromycin (ZITHROMAX) 250 MG tablet      gemfibrozil (LOPID) 600 MG tablet          DISCHARGE INSTRUCTIONS:    DIET:  Regular diet  DISCHARGE CONDITION:  Serious  ACTIVITY:  Activity as tolerated  OXYGEN:  Home Oxygen: No.   Oxygen Delivery: room air  DISCHARGE LOCATION:  Hospice  If you experience worsening of your admission symptoms, develop shortness of breath, life threatening emergency, suicidal or homicidal thoughts you must seek medical attention immediately by calling 911  or calling your MD immediately  if symptoms less severe.  You Must read complete instructions/literature along with all the possible adverse reactions/side effects for all the Medicines you take and that have been prescribed to you. Take any new Medicines after you have completely understood and accpet all the possible adverse reactions/side effects.   Please note  You were cared for by a hospitalist during your hospital stay. If you have any questions about your discharge medications or the care you received while you were in the hospital after you are discharged, you can call the unit and asked to speak with the hospitalist on call if the hospitalist that took care of you is not available. Once you are discharged, your primary care physician will handle any further medical issues. Please note that NO REFILLS for any discharge medications will be authorized once you are discharged, as it is imperative that you return to your primary care physician (or establish a relationship with a primary care physician if you do not have one) for your aftercare needs so that they can reassess your need for medications and monitor your lab values.    On the day of Discharge:   VITAL SIGNS:  Blood pressure 83/42, pulse 65, temperature 98 F (36.7 C), temperature source Axillary, resp. rate 12, height  (1.753 m), weight 96.5 kg (212 lb 11.9 oz), SpO2 100 %.  I/O:   Intake/Output Summary (Last 24 hours) at 08/25/15 1233 Last data filed at 08/25/15 0800  Gross per 24 hour  Intake 118.28 ml  Output      0 ml  Net 118.28 ml    PHYSICAL EXAMINATION:   GENERAL:82 y.o.male moderate distress given mental status.  HEAD: Normocephalic, atraumatic.  EYES: Pupils equal, round, reactive to light. Unable to assess extraocular muscles given mental status/medical condition. No scleral icterus.  MOUTH: Moist mucosal membrane. Dentition intact. No abscess noted.  EAR, NOSE, THROAT: Clear without exudates. No  external lesions.  NECK: Supple. No thyromegaly. No nodules. No JVD.  PULMONARY: Clear to ascultation, without wheeze rails or rhonci. No use of accessory muscles, Good respiratory effort. good air entry bilaterally CHEST: Nontender to palpation.  CARDIOVASCULAR: S1 and S2. Regular rate and rhythm. No murmurs, rubs, or gallops. No edema. Pedal pulses 2+ bilaterally.  GASTROINTESTINAL: Soft, nontender, nondistended. No masses. Positive bowel sounds. No hepatosplenomegaly.  MUSCULOSKELETAL: No swelling, clubbing, or edema. Range of motion full in all extremities.  NEUROLOGIC: Unable to assess given mental status/medical condition SKIN: No ulceration, lesions, rashes, or cyanosis. Skin warm and dry. Turgor intact.  PSYCHIATRIC: Unable to assess given mental status/medical condition     DATA REVIEW:   CBC  Recent Labs Lab 08/25/15 0508  WBC 9.7  HGB 8.7*  HCT 27.6*  PLT 342    Chemistries   Recent Labs Lab 08/22/15 0520  NA 140  K 4.8  CL 95*  CO2 25  GLUCOSE 106*  BUN 100*  CREATININE 8.96*  CALCIUM 9.6  MG 2.5*    Cardiac Enzymes No results for input(s): TROPONINI in the last 168 hours.  Microbiology Results  Results for orders placed or performed during the hospital encounter of 08/14/15  MRSA PCR Screening     Status: None   Collection Time: 08/14/15 12:35 PM  Result Value Ref Range Status   MRSA by PCR NEGATIVE NEGATIVE Final    Comment:        The GeneXpert MRSA Assay (FDA approved for NASAL specimens only), is one component of a comprehensive MRSA colonization surveillance program. It is not intended to diagnose MRSA infection nor to guide or monitor treatment for MRSA infections.   Culture, expectorated sputum-assessment     Status: None   Collection Time: 08/16/15 12:25 AM  Result Value Ref Range Status   Specimen Description SPUTUM  Final   Special Requests Normal  Final   Sputum evaluation THIS SPECIMEN IS ACCEPTABLE FOR SPUTUM CULTURE  Final    Report Status 08/16/2015 FINAL  Final  Culture, respiratory (NON-Expectorated)     Status: None   Collection Time: 08/16/15 12:25 AM  Result Value Ref Range Status   Specimen Description SPUTUM  Final   Special Requests Normal Reflexed from [id number]  Final   Gram Stain   Final    FEW WBC SEEN NO SQUAMOUS EPITHELIAL CELLS PRESENT FEW GRAM POSITIVE RODS RARE GRAM NEGATIVE RODS EXCELLENT SPECIMEN - 90-100% WBCS    Culture LIGHT GROWTH KLEBSIELLA PNEUMONIAE  Final   Report Status 08/19/2015 FINAL  Final   Organism ID, Bacteria KLEBSIELLA PNEUMONIAE  Final      Susceptibility   Klebsiella pneumoniae - MIC*    AMPICILLIN >=32 RESISTANT Resistant     CEFAZOLIN <=4 SENSITIVE Sensitive     CEFEPIME <=1 SENSITIVE Sensitive     CEFTAZIDIME <=1 SENSITIVE Sensitive     CEFTRIAXONE <=1 SENSITIVE Sensitive     CIPROFLOXACIN <=0.25 SENSITIVE Sensitive     GENTAMICIN <=1 SENSITIVE Sensitive     IMIPENEM <=0.25 SENSITIVE Sensitive     TRIMETH/SULFA <=20 SENSITIVE Sensitive     AMPICILLIN/SULBACTAM 4 SENSITIVE Sensitive     PIP/TAZO <=4 SENSITIVE Sensitive     Extended ESBL NEGATIVE Sensitive     * LIGHT GROWTH KLEBSIELLA PNEUMONIAE  Culture, blood (Routine X 2) w Reflex to ID Panel     Status: None   Collection Time: 08/16/15 10:51 AM  Result Value Ref Range Status   Specimen Description BLOOD RIGHT HAND  Final   Special Requests   Final    BOTTLES DRAWN AEROBIC AND ANAEROBIC ANA 6ML AER 10ML   Culture NO GROWTH 6 DAYS  Final   Report Status 08/22/2015 FINAL  Final  Culture, blood (Routine X 2) w Reflex to ID Panel     Status: None   Collection Time: 08/16/15 11:09 AM  Result Value Ref Range Status   Specimen Description BLOOD RIGHT HAND  Final   Special Requests BOTTLES DRAWN AEROBIC AND ANAEROBIC 7ML  Final   Culture NO GROWTH 6 DAYS  Final   Report Status 08/22/2015 FINAL  Final  Culture, expectorated sputum-assessment     Status: None   Collection Time: 08/16/15 12:15 PM  Result  Value Ref Range Status   Specimen Description EXPECTORATED SPUTUM  Final   Special Requests NONE  Final   Sputum evaluation THIS SPECIMEN IS ACCEPTABLE FOR SPUTUM CULTURE  Final   Report Status 08/16/2015 FINAL  Final  Culture, respiratory (NON-Expectorated)     Status: None   Collection Time: 08/16/15 12:15 PM  Result Value Ref Range Status   Specimen Description EXPECTORATED SPUTUM  Final   Special Requests NONE Reflexed from [id number]  Final   Gram Stain   Final    FEW WBC SEEN FEW SQUAMOUS EPITHELIAL CELLS PRESENT MODERATE GRAM POSITIVE RODS FEW GRAM NEGATIVE RODS FEW GRAM POSITIVE COCCI GOOD SPECIMEN - 80-90% WBCS    Culture   Final    HEAVY GROWTH KLEBSIELLA PNEUMONIAE HEAVY GROWTH HAEMOPHILUS PARAINFLUENZAE BETA LACTAMASE NEGATIVE    Report Status 08/19/2015 FINAL  Final   Organism ID, Bacteria KLEBSIELLA PNEUMONIAE  Final      Susceptibility   Klebsiella pneumoniae - MIC*    AMPICILLIN >=32 RESISTANT Resistant     CEFAZOLIN <=4 SENSITIVE Sensitive     CEFEPIME <=1 SENSITIVE Sensitive     CEFTAZIDIME <=1 SENSITIVE Sensitive     CEFTRIAXONE <=1 SENSITIVE Sensitive     CIPROFLOXACIN <=0.25 SENSITIVE Sensitive     GENTAMICIN <=1 SENSITIVE Sensitive     IMIPENEM <=0.25 SENSITIVE Sensitive     TRIMETH/SULFA <=20 SENSITIVE Sensitive     AMPICILLIN/SULBACTAM 4 SENSITIVE Sensitive     PIP/TAZO <=4 SENSITIVE Sensitive     Extended ESBL NEGATIVE Sensitive     * HEAVY GROWTH KLEBSIELLA PNEUMONIAE    RADIOLOGY:  No results found.   Management plans discussed with the patient, family and they are in agreement.  CODE STATUS:     Code Status Orders        Start     Ordered   08/25/15 0859  DNR (Do not attempt resuscitation)   Continuous    Question Answer Comment  In the event of cardiac or respiratory ARREST Do not call a "code blue"   In the event of cardiac or respiratory ARREST Do not perform Intubation, CPR, defibrillation or ACLS   In the event of cardiac or  respiratory ARREST Use medication by any route, position, wound care, and other measures to relive pain and suffering. May use oxygen, suction and manual treatment of airway obstruction as needed for comfort.      08/25/15 0914    Code Status History    Date Active Date Inactive Code Status Order ID Comments User Context   08/21/2015  4:52 PM 08/25/2015  9:14 AM DNR [id number]  Erin Fulling, MD Inpatient   08/17/2015 12:05 PM 08/21/2015  4:52 PM DNR [id number]  Alford Highland, MD Inpatient   08/14/2015 11:49 AM 08/17/2015 12:05 PM Full Code [id number]  Adrian Saran, MD Inpatient   05/31/2015  8:23 PM 06/02/2015  7:00 PM Full Code [id number]  Christena Flake, MD Inpatient   05/30/2015  1:08 PM 05/31/2015  8:23 PM Full Code [id number]  Adrian Saran, MD Inpatient    Advance Directive Documentation        Most Recent Value   Type of Advance Directive  Living will   Pre-existing out of facility DNR order (yellow form or pink MOST form)     "MOST" Form in Place?        TOTAL TIME TAKING CARE OF THIS PATIENT: 33 minutes.    Aadya Kindler,  Mardi Mainland.D on 08/25/2015 at 12:33 PM  Between 7am to 6pm - Pager - [phone]  After 6pm go to [email or web address] - password [email or web address]  [facility]  Office  [phone]  CC: Primary care physician; Lauro Regulus., MD

## 2015-08-25 NOTE — Care Management (Signed)
Patient to discharge to the hospice home

## 2015-08-25 NOTE — Progress Notes (Signed)
Dr. Orvan Falconerampbell at bedside. Levophed drip stopped. Wife and son at bedside. Patient is now comfort care. Patient alert and talking with family with no distress noted. CCMD notified.

## 2015-08-25 NOTE — Progress Notes (Addendum)
[facility] referral received from [place] following a Palliative Medicine consult. [name] is an 82 year old man who was admitted to [facility] on 2/20 for an elective left lower extremity angiogram. He was noted to have wheezing and coughing  and was placed on a nonrebreather mask. During the procedure his oxygen saturations decreased and he went into PEA arrest, he was intubated and CPR was initiated. He was successfully resuscitated and has required both a Dopamine drip as well as Levophed for blood pressure support. He was extubated on 2/25 and continued on a levophed at a low dose. Per chart note review he has suffered an anoxic brain injury with a decline in his cognition as well dysphasia and has remained NPO since 2/27. Patient's PMH includes: Parkinson's, PAD, ESRD, HD MWF, CHF, cardiac arrhythmia- pacemaker dependent, CVA, CAD with history of myocardial infarction, anemia of chronic disease, OSA, CDM II. Family has met several times with Dr. Megan Salon, Palliative Medicine physician, levophed has been discontinued.  Plan is for [name] to be transferred to the hospice home for management of anxiety/agitaion and end of life care.  Writer met in the patient's room with his son [name] and wife [name] to initiate education regarding hospice services, philosophy and team approach to care with good understanding voiced. Questions answered, consents signed. Family is familiar with the hospice home as they have had other family there. Information faxed to referral intake. Hospital care team all aware of and in agreement with plan for patient's transfer to the Hospice home today via EMS with signed portable DNR in place. Report called to the hospice home, EMS notified for transport. Thank you for the opportunity to be involved in the care of this patient and his family. Flo Shanks RN, BSN, [facility] [place] [phone]

## 2015-08-25 NOTE — Progress Notes (Signed)
Chaplain round the unit and provided a compassionate presence and support to the family as the patient appeared to be sleeping. Jefm PettyChaplain Alonso Gapinski [phone]

## 2015-08-25 NOTE — Progress Notes (Signed)
Palliative Medicine Inpatient Consult Follow Up Note   Name: William PumaWilliam E Tritschler Date: 08/25/2015 MRN: [id number]  DOB: 10/03/1928  Referring Physician: Wyatt Hasteavid K Hower, MD  Palliative Care consult requested for this 82 y.o. male for goals of medical therapy in patient with acute on chronic respiratory failure.  TODAY'S DISCUSSIONS AND DECISIONS Pt has had the levophed DCd.  He is dropping his blood pressure --but he is still awake and can (in my opinion) go to Hospice.  Family had requested [facility]ospice of A/[facility] when talking to me --they are familiar with Hospice Home --and [facility].  DNR form is completed and placed in the paper chart.  I will do DC meds.    IMPRESSION Acute cardi-resp failure with hypoxia --s/p CPR for 2 min in-house --still on pressor support due to cardiogenic shock and due to the hypotension of HD --intubated and then extubated  Hypotension ESRD on home HD PAD with recent angiogram --Vascular ulcer to left medial malleolus H/O CVA CAD w/ h/o MI Parkinson's Dz Arrythmia with h/o pacemeaker insertion Former Smoker DM2 OSA --used CPAP Chronic Systolic CHF Anemia of chronic disease  Klebsiella infection --pulmonary Post code anoxic encephalopathy ---markedly improved  Pt is very Hard of Hearing Malnutrition --currently has dobbhoff tube for nutrition and meds Dysphagia --severe and now NPO (since 2/27) Dyslipidemia   REVIEW OF SYSTEMS:  Patient is not able to provide ROS due to confusion  CODE STATUS: DNR   PAST MEDICAL HISTORY: Past Medical History  Diagnosis Date  . Renal disorder   . Stroke (HCC)   . Coronary artery disease   . Myocardial infarction (HCC)   . Parkinson disease (HCC)   . Chronic home hemodialysis status (HCC)     PAST SURGICAL HISTORY:  Past Surgical History  Procedure Laterality Date  . Pacemaker insertion    . Orif ankle fracture Left 05/31/2015    Procedure: OPEN REDUCTION INTERNAL  FIXATION TRIMALLEOLAR IANKLE FRACTURE DISLOCATION;  Surgeon: Christena FlakeJohn J Poggi, MD;  Location: [facility] ORS;  Service: Orthopedics;  Laterality: Left;  . Peripheral vascular catheterization N/A 07/27/2015    Procedure: A/V Shuntogram/Fistulagram;  Surgeon: Annice NeedyJason S Dew, MD;  Location: [facility] INVASIVE CV LAB;  Service: Cardiovascular;  Laterality: N/A;  . Peripheral vascular catheterization N/A 07/27/2015    Procedure: A/V Shunt Intervention;  Surgeon: Annice NeedyJason S Dew, MD;  Location: [facility] INVASIVE CV LAB;  Service: Cardiovascular;  Laterality: N/A;  . Peripheral vascular catheterization N/A 08/14/2015    Procedure: Abdominal Aortogram w/Lower Extremity;  Surgeon: Annice NeedyJason S Dew, MD;  Location: [facility] INVASIVE CV LAB;  Service: Cardiovascular;  Laterality: N/A;  . Peripheral vascular catheterization  08/14/2015    Procedure: Lower Extremity Intervention;  Surgeon: Annice NeedyJason S Dew, MD;  Location: [facility] INVASIVE CV LAB;  Service: Cardiovascular;;    Vital Signs: BP 92/44 mmHg  Pulse 65  Temp(Src) 97.9 F (36.6 C) (Axillary)  Resp 12  Ht 5\' 9"  (1.753 m)  Wt 96.5 kg (212 lb 11.9 oz)  BMI 31.40 kg/m2  SpO2 100% Filed Weights    Estimated body mass index is 31.4 kg/(m^2) as calculated from the following:   Height as of this encounter: 5\' 9"  (1.753 m).   Weight as of this encounter: 96.5 kg (212 lb 11.9 oz).  PHYSICAL EXAM: Restless and fidgety EOMI With a blank stare  He sometimes attempts to engage and he grimaces Fidgets with his bracelets Neck no JVD or TM Hrt rrr  no m but occas irreg beats Lungs cta Abd soft and NT Ext no cyanosis or mottling   LABS: CBC:    Component Value Date/Time   WBC 9.7 08/25/2015 0508   WBC 14.6* 01/11/2014 1412   HGB 8.7* 08/25/2015 0508   HGB 12.2* 01/11/2014 1412   HCT 27.6* 08/25/2015 0508   HCT 38.4* 01/11/2014 1412   PLT 342 08/25/2015 0508   PLT 203 01/11/2014 1412   MCV 89.7 08/25/2015 0508   MCV 102* 01/11/2014 1412   NEUTROABS 23.0* 08/14/2015 1235   NEUTROABS  13.3* 01/11/2014 1412   LYMPHSABS 1.2 08/14/2015 1235   LYMPHSABS 0.8* 01/11/2014 1412   MONOABS 0.7 08/14/2015 1235   MONOABS 0.4 01/11/2014 1412   EOSABS 0.1 08/14/2015 1235   EOSABS 0.0 01/11/2014 1412   BASOSABS 0.0 08/14/2015 1235   BASOSABS 0.0 01/11/2014 1412   Comprehensive Metabolic Panel:    Component Value Date/Time   NA 140 08/22/2015 0520   NA 140 01/11/2014 1412   K 4.8 08/22/2015 0520   K 5.4* 01/11/2014 1412   CL 95* 08/22/2015 0520   CL 101 01/11/2014 1412   CO2 25 08/22/2015 0520   CO2 28 01/11/2014 1412   BUN 100* 08/22/2015 0520   BUN 73* 01/11/2014 1412   CREATININE 8.96* 08/22/2015 0520   CREATININE 7.96* 01/11/2014 1412   GLUCOSE 106* 08/22/2015 0520   GLUCOSE 131* 01/11/2014 1412   CALCIUM 9.6 08/22/2015 0520   CALCIUM 8.2* 01/11/2014 1412   AST 34 08/14/2015 1236   AST 15 03/25/2012 0404   ALT 6* 08/14/2015 1236   ALT 13 03/25/2012 0404   ALKPHOS 98 08/14/2015 1236   ALKPHOS 87 03/25/2012 0404   BILITOT 1.2 08/14/2015 1236   BILITOT 0.4 03/25/2012 0404   PROT 7.0 08/14/2015 1236   PROT 7.4 03/25/2012 0404   ALBUMIN 3.0* 08/14/2015 1236   ALBUMIN 3.5 03/25/2012 0404    More than 50% of the visit was spent in counseling/coordination of care: YES  Time Spent:  35 min

## 2015-09-23 DEATH — deceased

## 2016-04-07 ENCOUNTER — Encounter: Payer: Self-pay | Admitting: Anesthesiology

## 2017-07-14 IMAGING — CT CT HEAD W/O CM
2 series · 15 of 30 positions shown, 19 images · non-contrast
Comparison: 08/14/2015

CLINICAL DATA: Responsible only to painful stimuli with no other
movements noted, suffered respiratory arrest during left lower
extremity angioplasty on 08/14/2015

EXAM:
CT HEAD WITHOUT CONTRAST
TECHNIQUE: Contiguous axial images were obtained from the base of the skull
through the vertex without intravenous contrast.

[Series 2: head wo · axial · 0.43mm/px · z∈[-116,+14]mm · 14 of 31 slices shown, 18 images]
[im 3/31  brain]
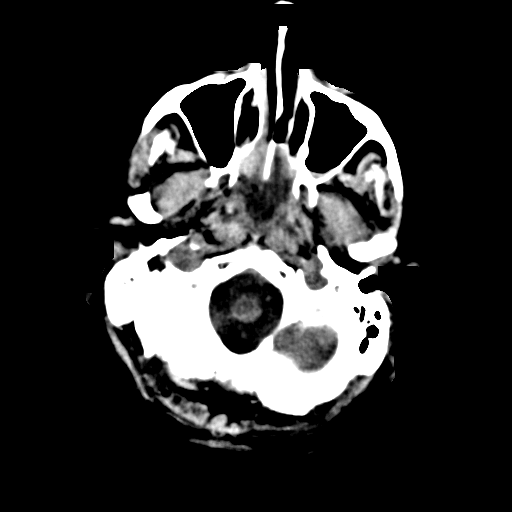
[im 3/31  bone]
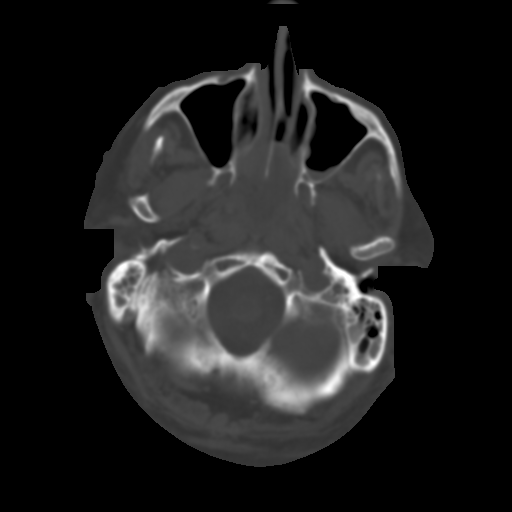
[im 5/31  brain]
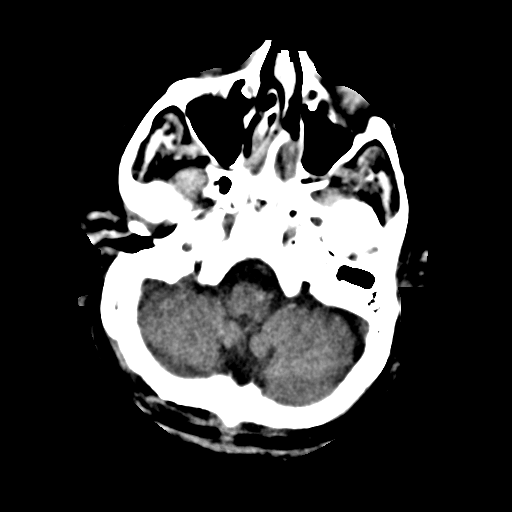
[im 7/31  brain]
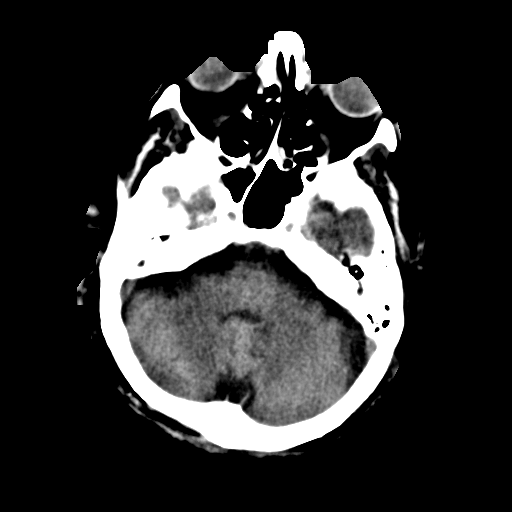
[im 9/31  brain]
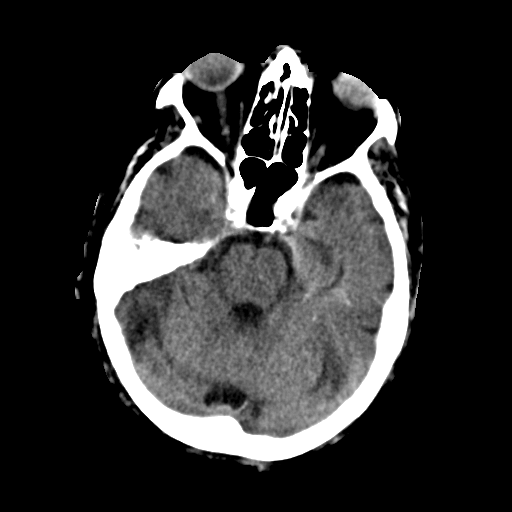
[im 11/31  brain]
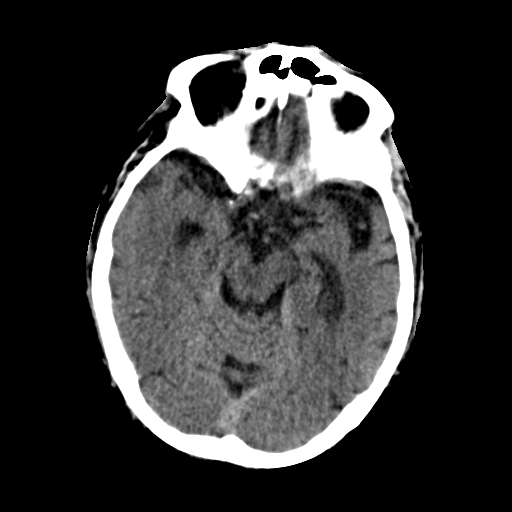
[im 11/31  bone]
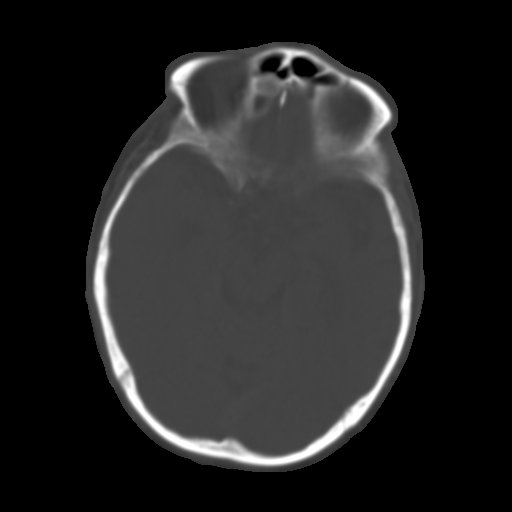
[im 13/31  brain]
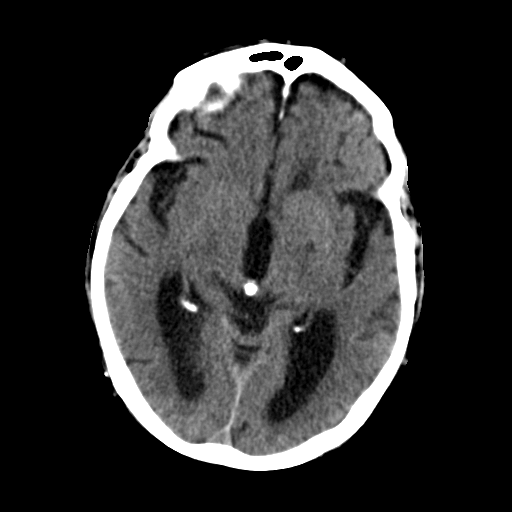
[im 15/31  brain]
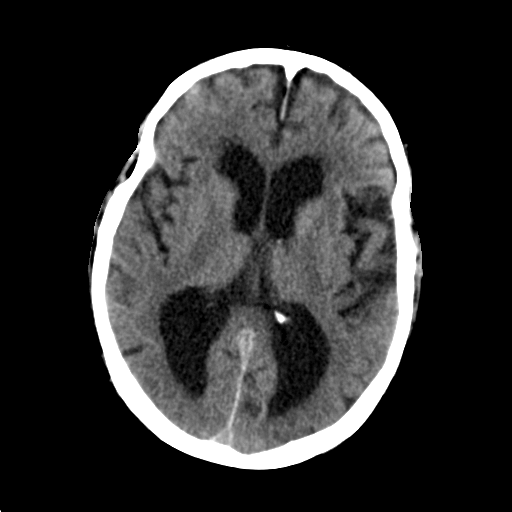
[im 17/31  brain]
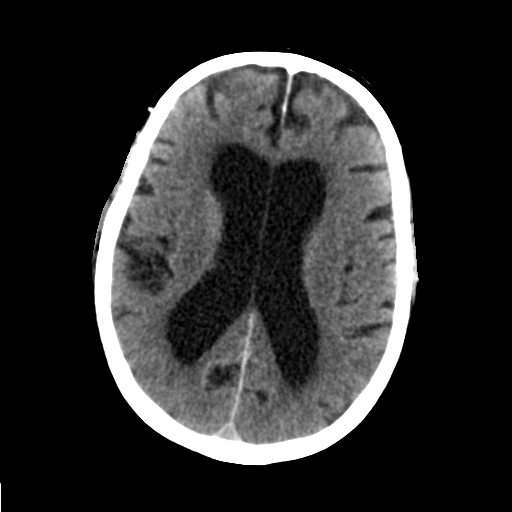
[im 19/31  brain]
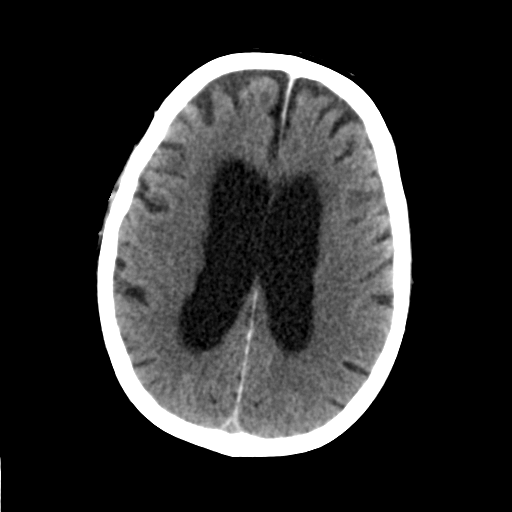
[im 19/31  bone]
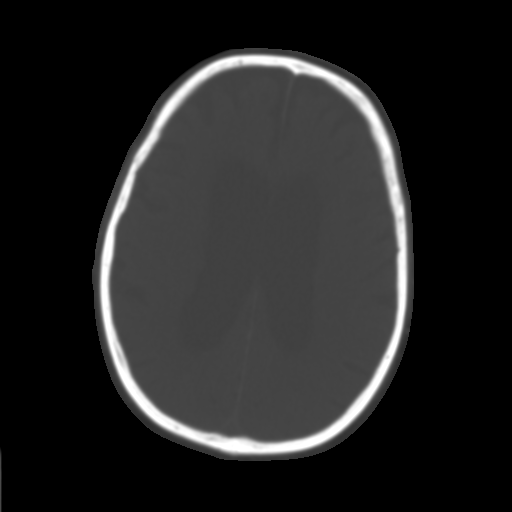
[im 21/31  brain]
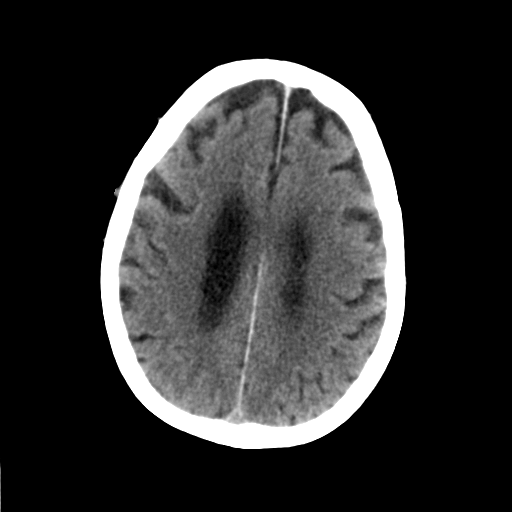
[im 23/31  brain]
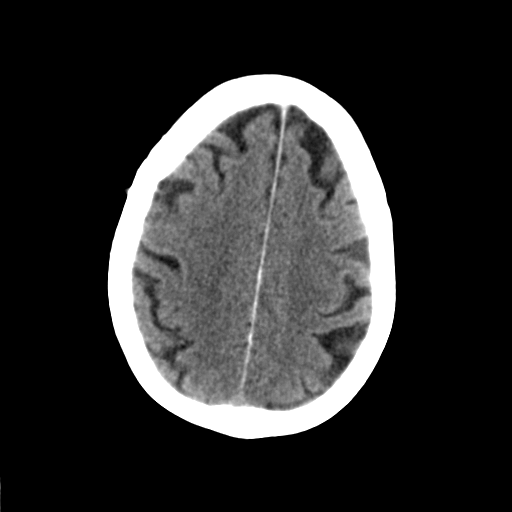
[im 25/31  brain]
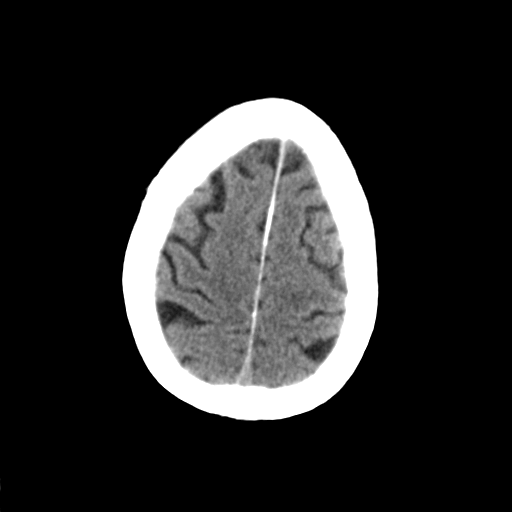
[im 27/31  brain]
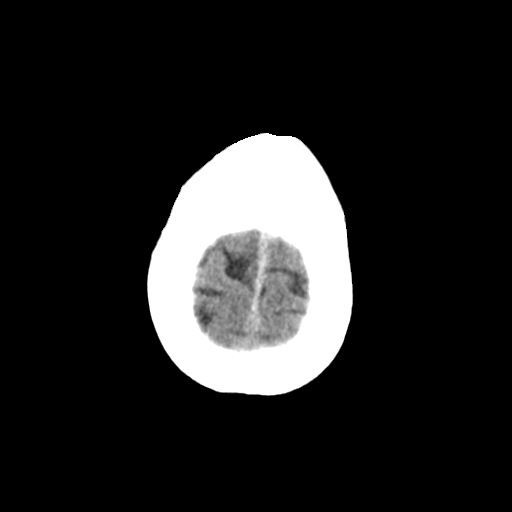
[im 27/31  bone]
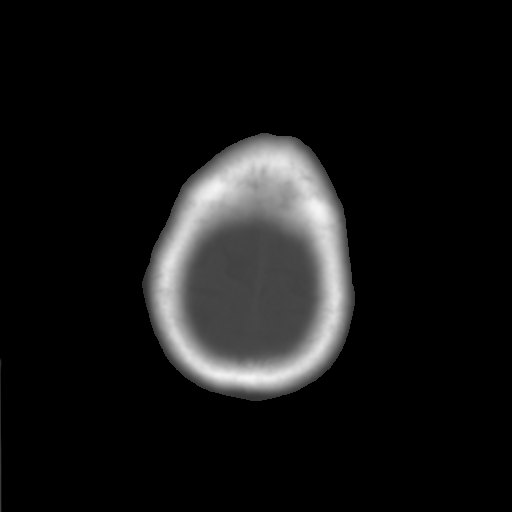
[im 29/31  brain]
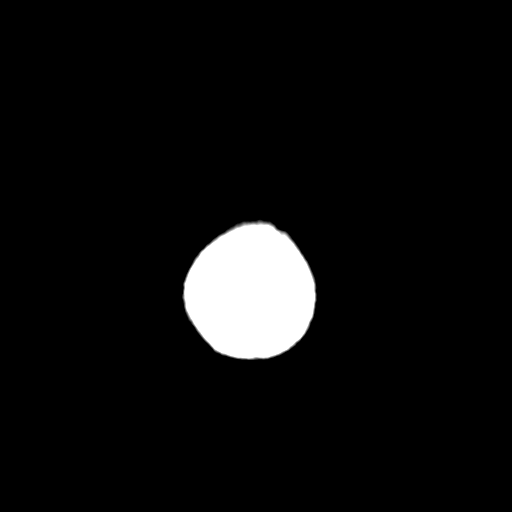

[Series 4: head wo 2 · axial · 0.34mm/px · 1 of 27 slices shown]
[im 3/27  brain]
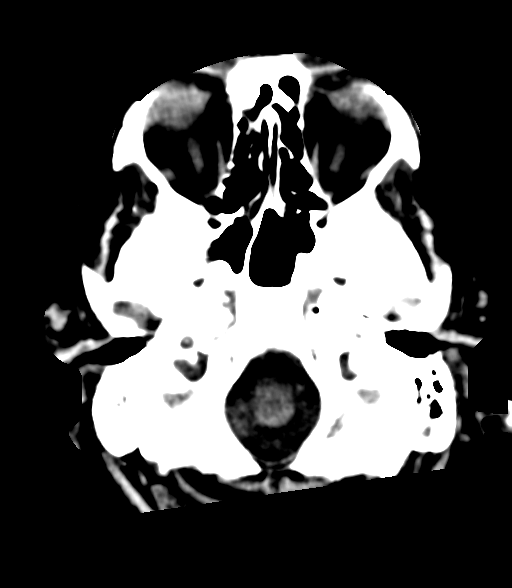

[15 of 30 positions shown; findings below may reference images not displayed]

FINDINGS: Fluid in the bilateral mastoid air cells. Inflammatory change left
maxillary sinus and to a lesser degree in the ethmoid air cells.
These findings are stable.

There is no evidence of intracranial hemorrhage or extra-axial
fluid. There is diffuse atrophy and white matter disease with no
evidence of vascular territory infarct. Stable prominence of the
ventricles related to atrophy.
IMPRESSION: Involutional changes and mastoid air cell opacification stable. No
acute intracranial abnormalities.

## 2017-07-15 IMAGING — CR DG CHEST 1V
1 series · 2 of 2 positions shown · non-contrast
Comparison: 08/16/2015

CLINICAL DATA: Dyspnea

EXAM:
CHEST 1 VIEW

[Series 1: ap · 0.17mm/px · 2 of 2 slices shown]
[im 1/2]
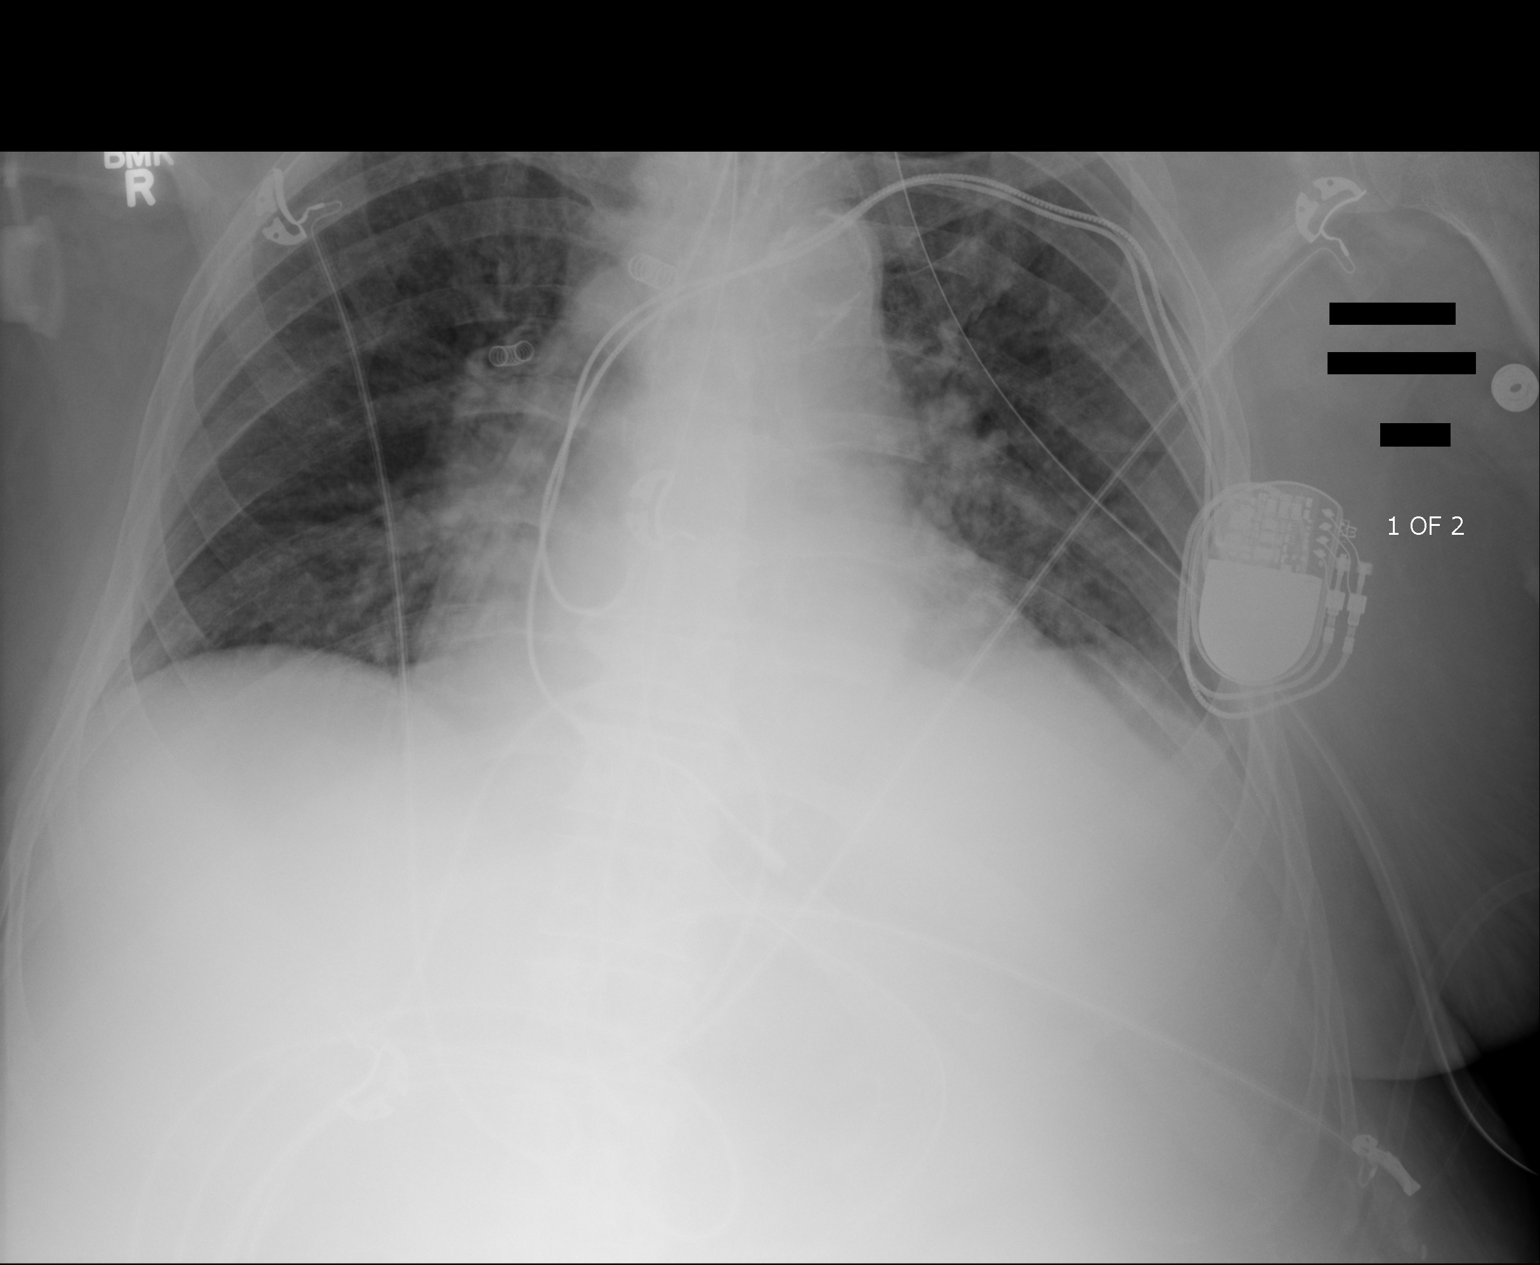
[im 2/2]
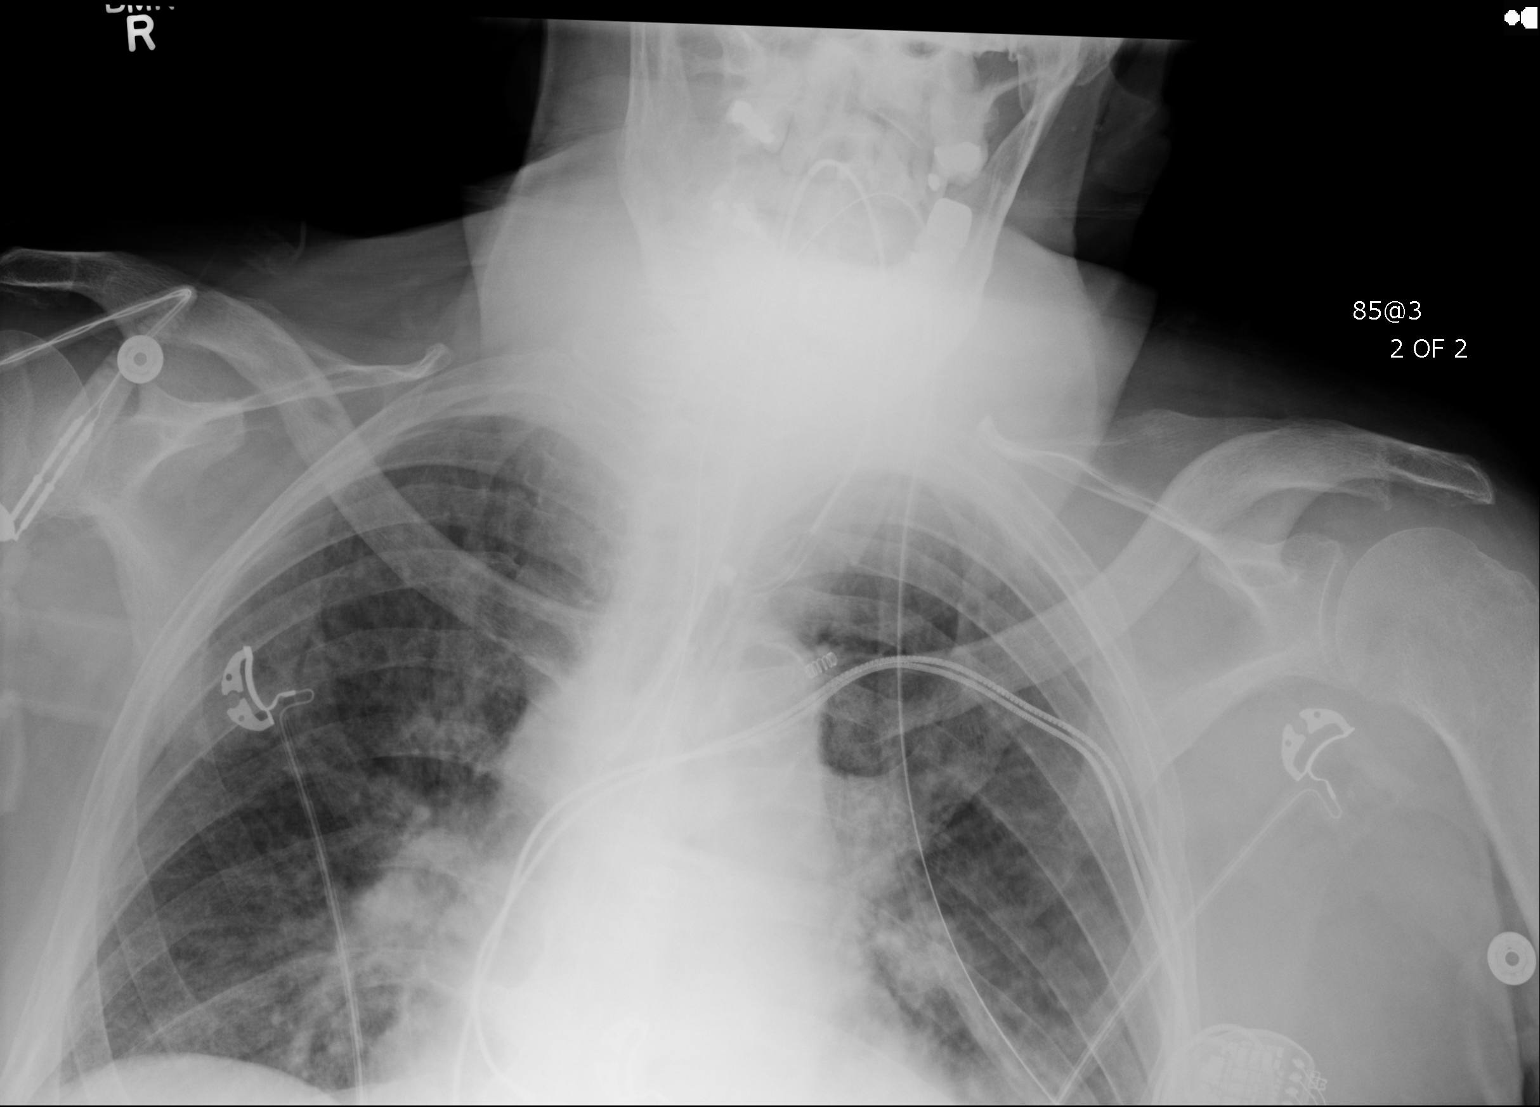

[2 of 2 positions shown; findings below may reference images not displayed]

FINDINGS: Endotracheal tube in satisfactory position. NG tube in the stomach.
Dual lead pacemaker unchanged

Hypoventilation with bibasilar atelectasis similar to the prior
study. Negative for heart failure or effusion.
IMPRESSION: Endotracheal tube in good position.

Hypoventilation with bibasilar atelectasis is unchanged.

## 2017-07-19 IMAGING — DX DG CHEST 1V PORT
1 series · 1 of 1 positions shown · non-contrast
Comparison: August 19, 2015

CLINICAL DATA: Respiratory failure

EXAM:
PORTABLE CHEST 1 VIEW

[chest ap]
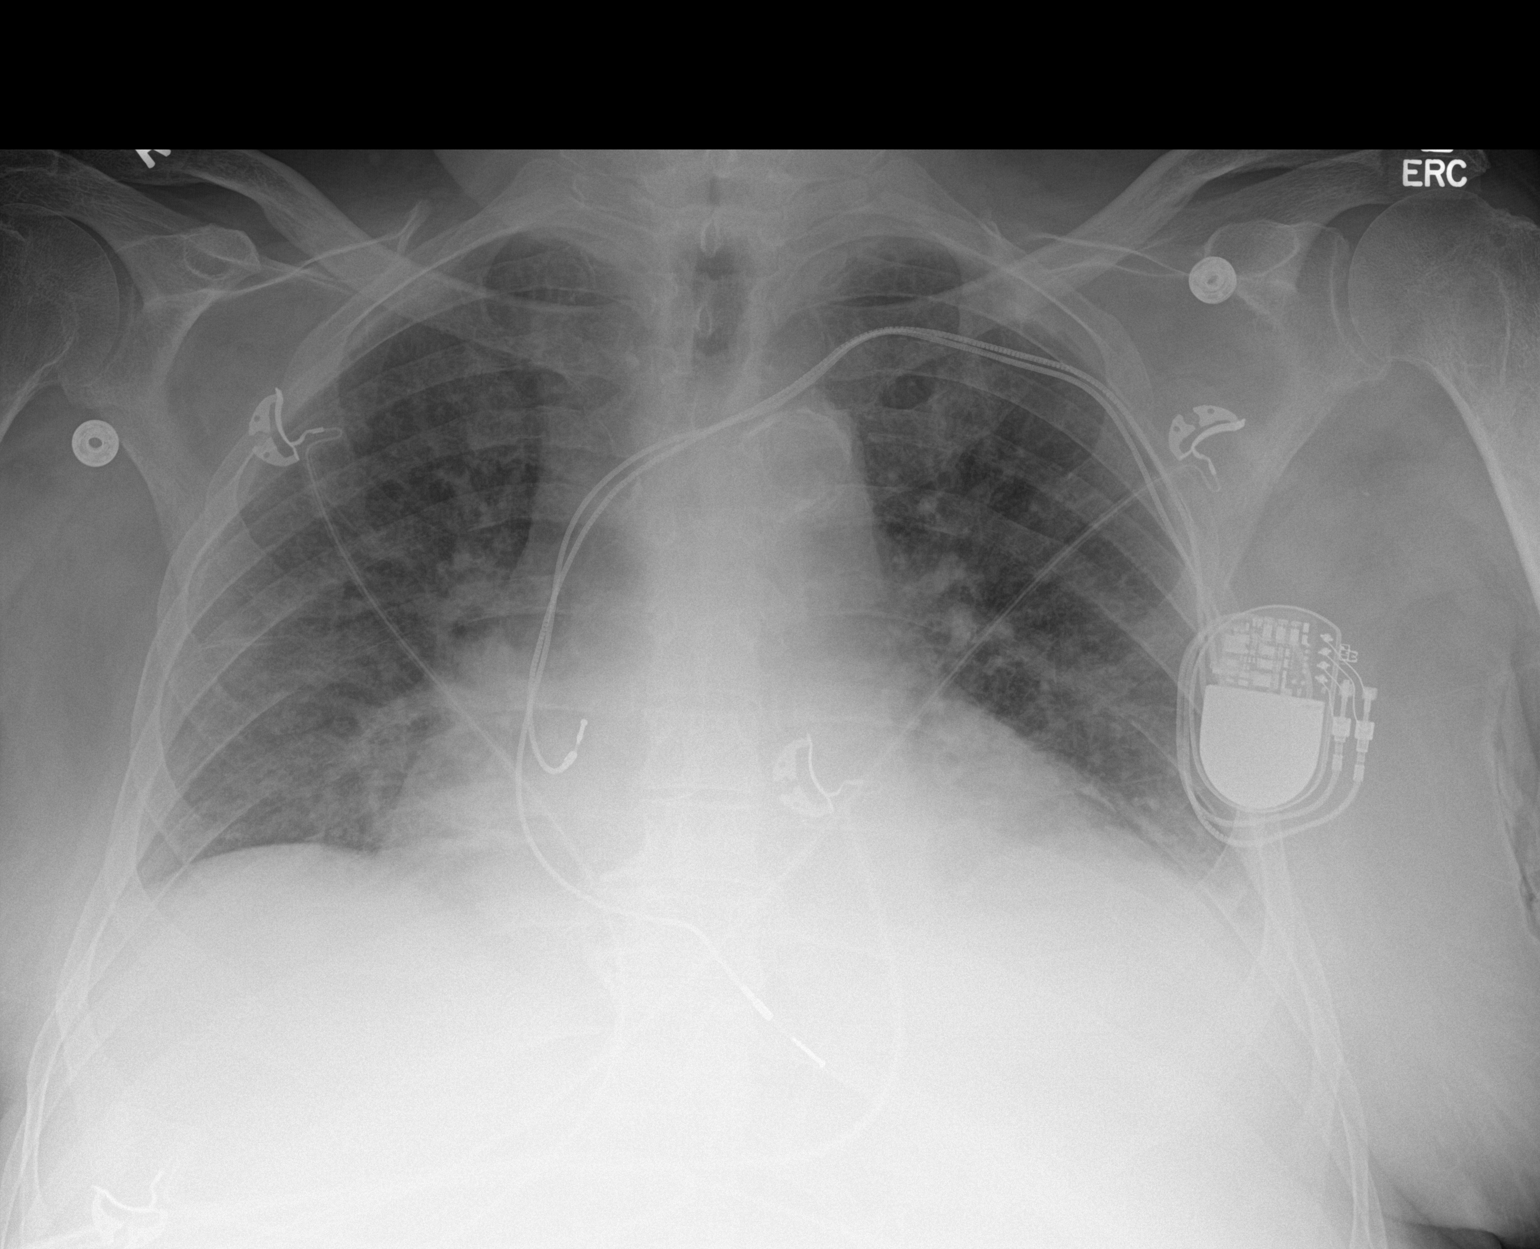

[1 of 1 positions shown; findings below may reference images not displayed]

FINDINGS: Endotracheal tube has been removed. No pneumothorax. There is mild
bibasilar atelectasis. No new opacity. Heart is mildly enlarged with
pulmonary vascularity within normal limits. No adenopathy. Pacemaker
leads are attached to the right atrium and right ventricle.
IMPRESSION: No pneumothorax. Mild bibasilar atelectasis. No frank edema or
consolidation. Stable cardiomegaly.
